# Patient Record
Sex: Male | Born: 1940 | Race: Black or African American | Hispanic: No | Marital: Married | Smoking: Former smoker
Health system: Southern US, Community
[De-identification: ages and names within clinical notes are randomized; demographics above are authoritative.]

## PROBLEM LIST (undated history)

## (undated) DIAGNOSIS — E785 Hyperlipidemia, unspecified: Secondary | ICD-10-CM

## (undated) DIAGNOSIS — D649 Anemia, unspecified: Secondary | ICD-10-CM

## (undated) DIAGNOSIS — M1990 Unspecified osteoarthritis, unspecified site: Secondary | ICD-10-CM

## (undated) DIAGNOSIS — C649 Malignant neoplasm of unspecified kidney, except renal pelvis: Secondary | ICD-10-CM

## (undated) DIAGNOSIS — E119 Type 2 diabetes mellitus without complications: Secondary | ICD-10-CM

## (undated) DIAGNOSIS — I10 Essential (primary) hypertension: Secondary | ICD-10-CM

## (undated) DIAGNOSIS — I639 Cerebral infarction, unspecified: Secondary | ICD-10-CM

## (undated) DIAGNOSIS — C61 Malignant neoplasm of prostate: Secondary | ICD-10-CM

## (undated) DIAGNOSIS — N189 Chronic kidney disease, unspecified: Secondary | ICD-10-CM

---

## 2008-03-06 ENCOUNTER — Ambulatory Visit: Admission: RE | Admit: 2008-03-06 | Discharge: 2008-06-04 | Payer: Self-pay | Admitting: Radiation Oncology

## 2008-04-01 ENCOUNTER — Encounter: Admission: RE | Admit: 2008-04-01 | Discharge: 2008-04-01 | Payer: Self-pay | Admitting: Urology

## 2008-04-22 ENCOUNTER — Ambulatory Visit (HOSPITAL_BASED_OUTPATIENT_CLINIC_OR_DEPARTMENT_OTHER): Admission: RE | Admit: 2008-04-22 | Discharge: 2008-04-22 | Payer: Self-pay | Admitting: Urology

## 2008-12-18 DIAGNOSIS — E785 Hyperlipidemia, unspecified: Secondary | ICD-10-CM | POA: Insufficient documentation

## 2008-12-18 DIAGNOSIS — Z8546 Personal history of malignant neoplasm of prostate: Secondary | ICD-10-CM | POA: Insufficient documentation

## 2008-12-18 DIAGNOSIS — I10 Essential (primary) hypertension: Secondary | ICD-10-CM | POA: Insufficient documentation

## 2008-12-19 DIAGNOSIS — K219 Gastro-esophageal reflux disease without esophagitis: Secondary | ICD-10-CM | POA: Insufficient documentation

## 2008-12-19 DIAGNOSIS — I69959 Hemiplegia and hemiparesis following unspecified cerebrovascular disease affecting unspecified side: Secondary | ICD-10-CM | POA: Insufficient documentation

## 2008-12-19 DIAGNOSIS — Z8673 Personal history of transient ischemic attack (TIA), and cerebral infarction without residual deficits: Secondary | ICD-10-CM | POA: Insufficient documentation

## 2009-12-01 DIAGNOSIS — N1832 Chronic kidney disease, stage 3b: Secondary | ICD-10-CM | POA: Insufficient documentation

## 2010-04-15 LAB — CBC
HCT: 38.2 % — ABNORMAL LOW (ref 39.0–52.0)
Hemoglobin: 12.7 g/dL — ABNORMAL LOW (ref 13.0–17.0)
MCV: 101.5 fL — ABNORMAL HIGH (ref 78.0–100.0)
WBC: 4 10*3/uL (ref 4.0–10.5)

## 2010-04-15 LAB — COMPREHENSIVE METABOLIC PANEL
Alkaline Phosphatase: 76 U/L (ref 39–117)
BUN: 17 mg/dL (ref 6–23)
Chloride: 104 mEq/L (ref 96–112)
GFR calc non Af Amer: 47 mL/min — ABNORMAL LOW (ref >60)
Glucose, Bld: 242 mg/dL — ABNORMAL HIGH (ref 70–99)
Potassium: 3.9 mEq/L (ref 3.5–5.1)
Total Bilirubin: 1 mg/dL (ref 0.3–1.2)

## 2010-04-15 LAB — PROTIME-INR
INR: 1 (ref 0.00–1.49)
Prothrombin Time: 13.5 seconds (ref 11.6–15.2)

## 2010-04-15 LAB — GLUCOSE, CAPILLARY: Glucose-Capillary: 170 mg/dL — ABNORMAL HIGH (ref 70–99)

## 2010-05-19 NOTE — Op Note (Signed)
NAMEMARQUIESE, Jason Nielsen              ACCOUNT NO.:  [id number]   MEDICAL RECORD NO.:  [id number]          PATIENT TYPE:  AMB   LOCATION:  [facility]                         FACILITY:  [facility]   PHYSICIAN:  Raynelle Bring, MD      DATE OF BIRTH:  09/09/1940   DATE OF PROCEDURE:  04/22/2008  DATE OF DISCHARGE:                               OPERATIVE REPORT   PREOPERATIVE DIAGNOSIS:  Prostate cancer.   POSTOPERATIVE DIAGNOSIS:  Prostate cancer.   PROCEDURES:  1. Flexible cystoscopy.  2. Transperineal placement of radiation seeds into prostate.   SURGEON:  Dr. Raynelle Bring.   RADIATION ONCOLOGIST:  Dr. Arloa Koh.   ANESTHESIA:  General.   COMPLICATIONS:  None.   INDICATIONS:  [name] is a 70 year old gentleman with clinically  localized adenocarcinoma of the prostate.  After a thorough discussion  regarding management options for treatment, he elected to proceed with  the above procedures.  The potential risks, complications, and  alternative treatment options were discussed in detail and informed  consent was obtained.   DESCRIPTION OF PROCEDURE:  The patient was taken to the operating room  and a general anesthetic was administered.  He was given preoperative  antibiotics, placed in the dorsal lithotomy position, and prepped and  draped in the usual sterile fashion.  A transperineal template was  placed over his perineum.  Using transrectal ultrasound guidance, a  preoperative plan for placement of the radiation seeds was performed by  Dr. Arloa Koh.  Once the preoperative plan was complete, needles  were placed through the transperineal template into the corresponding  area of the prostate according to the preoperative plan.  The radiation  seeds were then placed into the prostate via the [place] system.  A  total of 76 seeds were placed via 20 needles.  The total treatment dose  was 145 gray.  The patient tolerated the procedure well without  complications.  After  placement of the seeds, his genitalia was prepped  and draped in the usual sterile fashion.   The Foley catheter which had been placed prior to the procedure was now  removed and flexible cystourethroscopy was performed which demonstrated  a normal anterior and posterior urethra.  Examination of the bladder  systematically revealed the ureteral orifices to be in the normal  anatomic position and without evidence of any bladder tumors, stones, or  other mucosal pathology within the bladder.  No radiation seeds were  identified within the  bladder or prostatic urethra.  A Foley catheter was then reinserted and  left indwelling.  The patient tolerated the procedure well without  complications.  He was able to be awakened and transferred to the  recovery unit in satisfactory condition.      Raynelle Bring, MD  Electronically Signed     LB/MEDQ  D:  04/22/2008  T:  04/22/2008  Job:  [id number]

## 2011-01-06 DIAGNOSIS — I10 Essential (primary) hypertension: Secondary | ICD-10-CM | POA: Diagnosis not present

## 2011-01-06 DIAGNOSIS — E1149 Type 2 diabetes mellitus with other diabetic neurological complication: Secondary | ICD-10-CM | POA: Diagnosis not present

## 2011-01-06 DIAGNOSIS — E785 Hyperlipidemia, unspecified: Secondary | ICD-10-CM | POA: Diagnosis not present

## 2011-01-06 DIAGNOSIS — Z125 Encounter for screening for malignant neoplasm of prostate: Secondary | ICD-10-CM | POA: Diagnosis not present

## 2011-01-12 DIAGNOSIS — E1129 Type 2 diabetes mellitus with other diabetic kidney complication: Secondary | ICD-10-CM | POA: Diagnosis not present

## 2011-01-12 DIAGNOSIS — E1149 Type 2 diabetes mellitus with other diabetic neurological complication: Secondary | ICD-10-CM | POA: Diagnosis not present

## 2011-01-12 DIAGNOSIS — Z0000 Encounter for general adult medical examination without abnormal findings: Secondary | ICD-10-CM | POA: Diagnosis not present

## 2011-01-12 DIAGNOSIS — Z125 Encounter for screening for malignant neoplasm of prostate: Secondary | ICD-10-CM | POA: Diagnosis not present

## 2011-01-12 DIAGNOSIS — I10 Essential (primary) hypertension: Secondary | ICD-10-CM | POA: Diagnosis not present

## 2011-01-14 DIAGNOSIS — Z1212 Encounter for screening for malignant neoplasm of rectum: Secondary | ICD-10-CM | POA: Diagnosis not present

## 2011-03-12 DIAGNOSIS — C61 Malignant neoplasm of prostate: Secondary | ICD-10-CM | POA: Diagnosis not present

## 2011-03-18 DIAGNOSIS — H40019 Open angle with borderline findings, low risk, unspecified eye: Secondary | ICD-10-CM | POA: Diagnosis not present

## 2011-03-18 DIAGNOSIS — H1045 Other chronic allergic conjunctivitis: Secondary | ICD-10-CM | POA: Diagnosis not present

## 2011-03-18 DIAGNOSIS — H2510 Age-related nuclear cataract, unspecified eye: Secondary | ICD-10-CM | POA: Diagnosis not present

## 2011-03-18 DIAGNOSIS — H35039 Hypertensive retinopathy, unspecified eye: Secondary | ICD-10-CM | POA: Diagnosis not present

## 2011-03-18 DIAGNOSIS — E119 Type 2 diabetes mellitus without complications: Secondary | ICD-10-CM | POA: Diagnosis not present

## 2011-03-18 DIAGNOSIS — H04129 Dry eye syndrome of unspecified lacrimal gland: Secondary | ICD-10-CM | POA: Diagnosis not present

## 2011-03-19 DIAGNOSIS — N400 Enlarged prostate without lower urinary tract symptoms: Secondary | ICD-10-CM | POA: Diagnosis not present

## 2011-03-19 DIAGNOSIS — C61 Malignant neoplasm of prostate: Secondary | ICD-10-CM | POA: Diagnosis not present

## 2011-05-14 DIAGNOSIS — E1129 Type 2 diabetes mellitus with other diabetic kidney complication: Secondary | ICD-10-CM | POA: Diagnosis not present

## 2011-05-14 DIAGNOSIS — E785 Hyperlipidemia, unspecified: Secondary | ICD-10-CM | POA: Diagnosis not present

## 2011-05-14 DIAGNOSIS — I10 Essential (primary) hypertension: Secondary | ICD-10-CM | POA: Diagnosis not present

## 2011-05-14 DIAGNOSIS — E1149 Type 2 diabetes mellitus with other diabetic neurological complication: Secondary | ICD-10-CM | POA: Diagnosis not present

## 2011-05-14 DIAGNOSIS — I69959 Hemiplegia and hemiparesis following unspecified cerebrovascular disease affecting unspecified side: Secondary | ICD-10-CM | POA: Diagnosis not present

## 2011-06-11 DIAGNOSIS — H1045 Other chronic allergic conjunctivitis: Secondary | ICD-10-CM | POA: Diagnosis not present

## 2011-06-11 DIAGNOSIS — H04129 Dry eye syndrome of unspecified lacrimal gland: Secondary | ICD-10-CM | POA: Diagnosis not present

## 2011-06-11 DIAGNOSIS — H40019 Open angle with borderline findings, low risk, unspecified eye: Secondary | ICD-10-CM | POA: Diagnosis not present

## 2011-09-17 DIAGNOSIS — C61 Malignant neoplasm of prostate: Secondary | ICD-10-CM | POA: Diagnosis not present

## 2011-09-17 DIAGNOSIS — Z0000 Encounter for general adult medical examination without abnormal findings: Secondary | ICD-10-CM | POA: Diagnosis not present

## 2011-09-24 DIAGNOSIS — C61 Malignant neoplasm of prostate: Secondary | ICD-10-CM | POA: Diagnosis not present

## 2011-09-24 DIAGNOSIS — N400 Enlarged prostate without lower urinary tract symptoms: Secondary | ICD-10-CM | POA: Diagnosis not present

## 2011-10-01 DIAGNOSIS — I10 Essential (primary) hypertension: Secondary | ICD-10-CM | POA: Diagnosis not present

## 2011-10-01 DIAGNOSIS — E785 Hyperlipidemia, unspecified: Secondary | ICD-10-CM | POA: Diagnosis not present

## 2011-10-01 DIAGNOSIS — Z23 Encounter for immunization: Secondary | ICD-10-CM | POA: Diagnosis not present

## 2011-10-01 DIAGNOSIS — E1129 Type 2 diabetes mellitus with other diabetic kidney complication: Secondary | ICD-10-CM | POA: Diagnosis not present

## 2011-10-01 DIAGNOSIS — E1149 Type 2 diabetes mellitus with other diabetic neurological complication: Secondary | ICD-10-CM | POA: Diagnosis not present

## 2011-10-01 DIAGNOSIS — I69959 Hemiplegia and hemiparesis following unspecified cerebrovascular disease affecting unspecified side: Secondary | ICD-10-CM | POA: Diagnosis not present

## 2012-01-10 DIAGNOSIS — Z125 Encounter for screening for malignant neoplasm of prostate: Secondary | ICD-10-CM | POA: Diagnosis not present

## 2012-01-10 DIAGNOSIS — E1129 Type 2 diabetes mellitus with other diabetic kidney complication: Secondary | ICD-10-CM | POA: Diagnosis not present

## 2012-01-10 DIAGNOSIS — E785 Hyperlipidemia, unspecified: Secondary | ICD-10-CM | POA: Diagnosis not present

## 2012-01-10 DIAGNOSIS — I10 Essential (primary) hypertension: Secondary | ICD-10-CM | POA: Diagnosis not present

## 2012-01-13 DIAGNOSIS — E1129 Type 2 diabetes mellitus with other diabetic kidney complication: Secondary | ICD-10-CM | POA: Diagnosis not present

## 2012-01-13 DIAGNOSIS — Z0000 Encounter for general adult medical examination without abnormal findings: Secondary | ICD-10-CM | POA: Diagnosis not present

## 2012-01-13 DIAGNOSIS — I10 Essential (primary) hypertension: Secondary | ICD-10-CM | POA: Diagnosis not present

## 2012-02-03 DIAGNOSIS — Z1212 Encounter for screening for malignant neoplasm of rectum: Secondary | ICD-10-CM | POA: Diagnosis not present

## 2012-03-16 DIAGNOSIS — H35039 Hypertensive retinopathy, unspecified eye: Secondary | ICD-10-CM | POA: Diagnosis not present

## 2012-03-16 DIAGNOSIS — E119 Type 2 diabetes mellitus without complications: Secondary | ICD-10-CM | POA: Diagnosis not present

## 2012-03-29 DIAGNOSIS — C61 Malignant neoplasm of prostate: Secondary | ICD-10-CM | POA: Diagnosis not present

## 2012-03-29 DIAGNOSIS — N400 Enlarged prostate without lower urinary tract symptoms: Secondary | ICD-10-CM | POA: Diagnosis not present

## 2012-05-12 DIAGNOSIS — C61 Malignant neoplasm of prostate: Secondary | ICD-10-CM | POA: Diagnosis not present

## 2012-05-12 DIAGNOSIS — Z6829 Body mass index (BMI) 29.0-29.9, adult: Secondary | ICD-10-CM | POA: Diagnosis not present

## 2012-05-12 DIAGNOSIS — E1149 Type 2 diabetes mellitus with other diabetic neurological complication: Secondary | ICD-10-CM | POA: Diagnosis not present

## 2012-05-12 DIAGNOSIS — E785 Hyperlipidemia, unspecified: Secondary | ICD-10-CM | POA: Diagnosis not present

## 2012-05-12 DIAGNOSIS — I10 Essential (primary) hypertension: Secondary | ICD-10-CM | POA: Diagnosis not present

## 2012-05-12 DIAGNOSIS — H359 Unspecified retinal disorder: Secondary | ICD-10-CM | POA: Insufficient documentation

## 2012-05-12 DIAGNOSIS — H3500 Unspecified background retinopathy: Secondary | ICD-10-CM | POA: Diagnosis not present

## 2012-05-12 DIAGNOSIS — E1129 Type 2 diabetes mellitus with other diabetic kidney complication: Secondary | ICD-10-CM | POA: Diagnosis not present

## 2012-05-23 DIAGNOSIS — Z1331 Encounter for screening for depression: Secondary | ICD-10-CM | POA: Diagnosis not present

## 2012-05-23 DIAGNOSIS — Z6829 Body mass index (BMI) 29.0-29.9, adult: Secondary | ICD-10-CM | POA: Diagnosis not present

## 2012-05-23 DIAGNOSIS — M5430 Sciatica, unspecified side: Secondary | ICD-10-CM | POA: Diagnosis not present

## 2012-05-23 DIAGNOSIS — E1129 Type 2 diabetes mellitus with other diabetic kidney complication: Secondary | ICD-10-CM | POA: Diagnosis not present

## 2012-06-22 DIAGNOSIS — H40019 Open angle with borderline findings, low risk, unspecified eye: Secondary | ICD-10-CM | POA: Diagnosis not present

## 2012-09-14 DIAGNOSIS — Z23 Encounter for immunization: Secondary | ICD-10-CM | POA: Diagnosis not present

## 2012-09-14 DIAGNOSIS — I10 Essential (primary) hypertension: Secondary | ICD-10-CM | POA: Diagnosis not present

## 2012-09-14 DIAGNOSIS — H3500 Unspecified background retinopathy: Secondary | ICD-10-CM | POA: Diagnosis not present

## 2012-09-14 DIAGNOSIS — I69959 Hemiplegia and hemiparesis following unspecified cerebrovascular disease affecting unspecified side: Secondary | ICD-10-CM | POA: Diagnosis not present

## 2012-09-14 DIAGNOSIS — C61 Malignant neoplasm of prostate: Secondary | ICD-10-CM | POA: Diagnosis not present

## 2012-09-14 DIAGNOSIS — E1149 Type 2 diabetes mellitus with other diabetic neurological complication: Secondary | ICD-10-CM | POA: Diagnosis not present

## 2012-09-14 DIAGNOSIS — E1129 Type 2 diabetes mellitus with other diabetic kidney complication: Secondary | ICD-10-CM | POA: Diagnosis not present

## 2012-09-14 DIAGNOSIS — E785 Hyperlipidemia, unspecified: Secondary | ICD-10-CM | POA: Diagnosis not present

## 2012-10-06 DIAGNOSIS — C61 Malignant neoplasm of prostate: Secondary | ICD-10-CM | POA: Diagnosis not present

## 2012-10-10 DIAGNOSIS — N400 Enlarged prostate without lower urinary tract symptoms: Secondary | ICD-10-CM | POA: Diagnosis not present

## 2012-10-10 DIAGNOSIS — C61 Malignant neoplasm of prostate: Secondary | ICD-10-CM | POA: Diagnosis not present

## 2013-01-11 DIAGNOSIS — E785 Hyperlipidemia, unspecified: Secondary | ICD-10-CM | POA: Diagnosis not present

## 2013-01-11 DIAGNOSIS — I10 Essential (primary) hypertension: Secondary | ICD-10-CM | POA: Diagnosis not present

## 2013-01-11 DIAGNOSIS — E1149 Type 2 diabetes mellitus with other diabetic neurological complication: Secondary | ICD-10-CM | POA: Diagnosis not present

## 2013-01-11 DIAGNOSIS — Z125 Encounter for screening for malignant neoplasm of prostate: Secondary | ICD-10-CM | POA: Diagnosis not present

## 2013-01-18 DIAGNOSIS — E785 Hyperlipidemia, unspecified: Secondary | ICD-10-CM | POA: Diagnosis not present

## 2013-01-18 DIAGNOSIS — H3500 Unspecified background retinopathy: Secondary | ICD-10-CM | POA: Diagnosis not present

## 2013-01-18 DIAGNOSIS — Z0000 Encounter for general adult medical examination without abnormal findings: Secondary | ICD-10-CM | POA: Diagnosis not present

## 2013-01-18 DIAGNOSIS — N183 Chronic kidney disease, stage 3 unspecified: Secondary | ICD-10-CM | POA: Diagnosis not present

## 2013-01-18 DIAGNOSIS — I10 Essential (primary) hypertension: Secondary | ICD-10-CM | POA: Diagnosis not present

## 2013-01-18 DIAGNOSIS — E1129 Type 2 diabetes mellitus with other diabetic kidney complication: Secondary | ICD-10-CM | POA: Diagnosis not present

## 2013-01-18 DIAGNOSIS — K219 Gastro-esophageal reflux disease without esophagitis: Secondary | ICD-10-CM | POA: Diagnosis not present

## 2013-01-18 DIAGNOSIS — I69959 Hemiplegia and hemiparesis following unspecified cerebrovascular disease affecting unspecified side: Secondary | ICD-10-CM | POA: Diagnosis not present

## 2013-01-18 DIAGNOSIS — Z23 Encounter for immunization: Secondary | ICD-10-CM | POA: Diagnosis not present

## 2013-01-23 DIAGNOSIS — Z1212 Encounter for screening for malignant neoplasm of rectum: Secondary | ICD-10-CM | POA: Diagnosis not present

## 2013-03-22 DIAGNOSIS — H40019 Open angle with borderline findings, low risk, unspecified eye: Secondary | ICD-10-CM | POA: Diagnosis not present

## 2013-03-22 DIAGNOSIS — E119 Type 2 diabetes mellitus without complications: Secondary | ICD-10-CM | POA: Diagnosis not present

## 2013-03-22 DIAGNOSIS — H524 Presbyopia: Secondary | ICD-10-CM | POA: Diagnosis not present

## 2013-03-22 DIAGNOSIS — H35039 Hypertensive retinopathy, unspecified eye: Secondary | ICD-10-CM | POA: Diagnosis not present

## 2013-04-20 DIAGNOSIS — C61 Malignant neoplasm of prostate: Secondary | ICD-10-CM | POA: Diagnosis not present

## 2013-04-27 DIAGNOSIS — C61 Malignant neoplasm of prostate: Secondary | ICD-10-CM | POA: Diagnosis not present

## 2013-04-27 DIAGNOSIS — N400 Enlarged prostate without lower urinary tract symptoms: Secondary | ICD-10-CM | POA: Diagnosis not present

## 2013-05-18 DIAGNOSIS — E785 Hyperlipidemia, unspecified: Secondary | ICD-10-CM | POA: Diagnosis not present

## 2013-05-18 DIAGNOSIS — Z6829 Body mass index (BMI) 29.0-29.9, adult: Secondary | ICD-10-CM | POA: Diagnosis not present

## 2013-05-18 DIAGNOSIS — E1129 Type 2 diabetes mellitus with other diabetic kidney complication: Secondary | ICD-10-CM | POA: Diagnosis not present

## 2013-05-18 DIAGNOSIS — N183 Chronic kidney disease, stage 3 unspecified: Secondary | ICD-10-CM | POA: Diagnosis not present

## 2013-05-18 DIAGNOSIS — H3500 Unspecified background retinopathy: Secondary | ICD-10-CM | POA: Diagnosis not present

## 2013-05-18 DIAGNOSIS — I69959 Hemiplegia and hemiparesis following unspecified cerebrovascular disease affecting unspecified side: Secondary | ICD-10-CM | POA: Diagnosis not present

## 2013-05-18 DIAGNOSIS — Z8546 Personal history of malignant neoplasm of prostate: Secondary | ICD-10-CM | POA: Diagnosis not present

## 2013-05-18 DIAGNOSIS — I10 Essential (primary) hypertension: Secondary | ICD-10-CM | POA: Diagnosis not present

## 2013-06-21 DIAGNOSIS — H40019 Open angle with borderline findings, low risk, unspecified eye: Secondary | ICD-10-CM | POA: Diagnosis not present

## 2013-09-21 DIAGNOSIS — I10 Essential (primary) hypertension: Secondary | ICD-10-CM | POA: Diagnosis not present

## 2013-09-21 DIAGNOSIS — Z23 Encounter for immunization: Secondary | ICD-10-CM | POA: Diagnosis not present

## 2013-09-21 DIAGNOSIS — H3500 Unspecified background retinopathy: Secondary | ICD-10-CM | POA: Diagnosis not present

## 2013-09-21 DIAGNOSIS — E1129 Type 2 diabetes mellitus with other diabetic kidney complication: Secondary | ICD-10-CM | POA: Diagnosis not present

## 2013-09-21 DIAGNOSIS — I69959 Hemiplegia and hemiparesis following unspecified cerebrovascular disease affecting unspecified side: Secondary | ICD-10-CM | POA: Diagnosis not present

## 2013-09-21 DIAGNOSIS — E785 Hyperlipidemia, unspecified: Secondary | ICD-10-CM | POA: Diagnosis not present

## 2013-09-21 DIAGNOSIS — N183 Chronic kidney disease, stage 3 unspecified: Secondary | ICD-10-CM | POA: Diagnosis not present

## 2013-09-21 DIAGNOSIS — Z0000 Encounter for general adult medical examination without abnormal findings: Secondary | ICD-10-CM | POA: Diagnosis not present

## 2013-09-21 DIAGNOSIS — Z8546 Personal history of malignant neoplasm of prostate: Secondary | ICD-10-CM | POA: Diagnosis not present

## 2014-02-06 DIAGNOSIS — I10 Essential (primary) hypertension: Secondary | ICD-10-CM | POA: Diagnosis not present

## 2014-02-06 DIAGNOSIS — E785 Hyperlipidemia, unspecified: Secondary | ICD-10-CM | POA: Diagnosis not present

## 2014-02-06 DIAGNOSIS — E1129 Type 2 diabetes mellitus with other diabetic kidney complication: Secondary | ICD-10-CM | POA: Diagnosis not present

## 2014-02-06 DIAGNOSIS — Z125 Encounter for screening for malignant neoplasm of prostate: Secondary | ICD-10-CM | POA: Diagnosis not present

## 2014-02-13 DIAGNOSIS — I10 Essential (primary) hypertension: Secondary | ICD-10-CM | POA: Diagnosis not present

## 2014-02-13 DIAGNOSIS — Z6830 Body mass index (BMI) 30.0-30.9, adult: Secondary | ICD-10-CM | POA: Diagnosis not present

## 2014-02-13 DIAGNOSIS — N183 Chronic kidney disease, stage 3 (moderate): Secondary | ICD-10-CM | POA: Diagnosis not present

## 2014-02-13 DIAGNOSIS — Z8546 Personal history of malignant neoplasm of prostate: Secondary | ICD-10-CM | POA: Diagnosis not present

## 2014-02-13 DIAGNOSIS — E785 Hyperlipidemia, unspecified: Secondary | ICD-10-CM | POA: Diagnosis not present

## 2014-02-13 DIAGNOSIS — H3500 Unspecified background retinopathy: Secondary | ICD-10-CM | POA: Diagnosis not present

## 2014-02-13 DIAGNOSIS — Z1389 Encounter for screening for other disorder: Secondary | ICD-10-CM | POA: Diagnosis not present

## 2014-02-13 DIAGNOSIS — I69959 Hemiplegia and hemiparesis following unspecified cerebrovascular disease affecting unspecified side: Secondary | ICD-10-CM | POA: Diagnosis not present

## 2014-02-13 DIAGNOSIS — Z0000 Encounter for general adult medical examination without abnormal findings: Secondary | ICD-10-CM | POA: Diagnosis not present

## 2014-02-13 DIAGNOSIS — E1129 Type 2 diabetes mellitus with other diabetic kidney complication: Secondary | ICD-10-CM | POA: Diagnosis not present

## 2014-02-13 DIAGNOSIS — K219 Gastro-esophageal reflux disease without esophagitis: Secondary | ICD-10-CM | POA: Diagnosis not present

## 2014-02-19 DIAGNOSIS — Z1212 Encounter for screening for malignant neoplasm of rectum: Secondary | ICD-10-CM | POA: Diagnosis not present

## 2014-03-25 DIAGNOSIS — H40013 Open angle with borderline findings, low risk, bilateral: Secondary | ICD-10-CM | POA: Diagnosis not present

## 2014-03-25 DIAGNOSIS — E1165 Type 2 diabetes mellitus with hyperglycemia: Secondary | ICD-10-CM | POA: Diagnosis not present

## 2014-04-26 DIAGNOSIS — C61 Malignant neoplasm of prostate: Secondary | ICD-10-CM | POA: Diagnosis not present

## 2014-05-03 DIAGNOSIS — N401 Enlarged prostate with lower urinary tract symptoms: Secondary | ICD-10-CM | POA: Diagnosis not present

## 2014-05-03 DIAGNOSIS — C61 Malignant neoplasm of prostate: Secondary | ICD-10-CM | POA: Diagnosis not present

## 2014-05-03 DIAGNOSIS — R3916 Straining to void: Secondary | ICD-10-CM | POA: Diagnosis not present

## 2014-05-30 DIAGNOSIS — N183 Chronic kidney disease, stage 3 (moderate): Secondary | ICD-10-CM | POA: Diagnosis not present

## 2014-05-30 DIAGNOSIS — E785 Hyperlipidemia, unspecified: Secondary | ICD-10-CM | POA: Diagnosis not present

## 2014-05-30 DIAGNOSIS — I10 Essential (primary) hypertension: Secondary | ICD-10-CM | POA: Diagnosis not present

## 2014-05-30 DIAGNOSIS — M109 Gout, unspecified: Secondary | ICD-10-CM | POA: Diagnosis not present

## 2014-05-30 DIAGNOSIS — E1129 Type 2 diabetes mellitus with other diabetic kidney complication: Secondary | ICD-10-CM | POA: Diagnosis not present

## 2014-05-30 DIAGNOSIS — Z8546 Personal history of malignant neoplasm of prostate: Secondary | ICD-10-CM | POA: Diagnosis not present

## 2014-05-30 DIAGNOSIS — H3500 Unspecified background retinopathy: Secondary | ICD-10-CM | POA: Diagnosis not present

## 2014-05-30 DIAGNOSIS — I69959 Hemiplegia and hemiparesis following unspecified cerebrovascular disease affecting unspecified side: Secondary | ICD-10-CM | POA: Diagnosis not present

## 2014-05-30 DIAGNOSIS — Z6827 Body mass index (BMI) 27.0-27.9, adult: Secondary | ICD-10-CM | POA: Diagnosis not present

## 2014-06-24 DIAGNOSIS — H40013 Open angle with borderline findings, low risk, bilateral: Secondary | ICD-10-CM | POA: Diagnosis not present

## 2014-09-04 DIAGNOSIS — Z6828 Body mass index (BMI) 28.0-28.9, adult: Secondary | ICD-10-CM | POA: Diagnosis not present

## 2014-09-04 DIAGNOSIS — H3500 Unspecified background retinopathy: Secondary | ICD-10-CM | POA: Diagnosis not present

## 2014-09-04 DIAGNOSIS — M109 Gout, unspecified: Secondary | ICD-10-CM | POA: Diagnosis not present

## 2014-09-04 DIAGNOSIS — E1129 Type 2 diabetes mellitus with other diabetic kidney complication: Secondary | ICD-10-CM | POA: Diagnosis not present

## 2014-09-04 DIAGNOSIS — N183 Chronic kidney disease, stage 3 (moderate): Secondary | ICD-10-CM | POA: Diagnosis not present

## 2014-09-04 DIAGNOSIS — Z23 Encounter for immunization: Secondary | ICD-10-CM | POA: Diagnosis not present

## 2014-09-04 DIAGNOSIS — I10 Essential (primary) hypertension: Secondary | ICD-10-CM | POA: Diagnosis not present

## 2014-09-04 DIAGNOSIS — Z8546 Personal history of malignant neoplasm of prostate: Secondary | ICD-10-CM | POA: Diagnosis not present

## 2014-09-04 DIAGNOSIS — E785 Hyperlipidemia, unspecified: Secondary | ICD-10-CM | POA: Diagnosis not present

## 2014-10-22 DIAGNOSIS — H40013 Open angle with borderline findings, low risk, bilateral: Secondary | ICD-10-CM | POA: Diagnosis not present

## 2014-12-10 DIAGNOSIS — E113292 Type 2 diabetes mellitus with mild nonproliferative diabetic retinopathy without macular edema, left eye: Secondary | ICD-10-CM | POA: Diagnosis not present

## 2014-12-10 DIAGNOSIS — H2513 Age-related nuclear cataract, bilateral: Secondary | ICD-10-CM | POA: Diagnosis not present

## 2014-12-10 DIAGNOSIS — H25013 Cortical age-related cataract, bilateral: Secondary | ICD-10-CM | POA: Diagnosis not present

## 2014-12-10 DIAGNOSIS — E1139 Type 2 diabetes mellitus with other diabetic ophthalmic complication: Secondary | ICD-10-CM | POA: Insufficient documentation

## 2014-12-10 DIAGNOSIS — H40013 Open angle with borderline findings, low risk, bilateral: Secondary | ICD-10-CM | POA: Diagnosis not present

## 2015-02-11 DIAGNOSIS — E1129 Type 2 diabetes mellitus with other diabetic kidney complication: Secondary | ICD-10-CM | POA: Diagnosis not present

## 2015-02-11 DIAGNOSIS — N183 Chronic kidney disease, stage 3 (moderate): Secondary | ICD-10-CM | POA: Diagnosis not present

## 2015-02-11 DIAGNOSIS — Z125 Encounter for screening for malignant neoplasm of prostate: Secondary | ICD-10-CM | POA: Diagnosis not present

## 2015-02-11 DIAGNOSIS — E784 Other hyperlipidemia: Secondary | ICD-10-CM | POA: Diagnosis not present

## 2015-02-11 DIAGNOSIS — M109 Gout, unspecified: Secondary | ICD-10-CM | POA: Diagnosis not present

## 2015-02-18 DIAGNOSIS — Z0000 Encounter for general adult medical examination without abnormal findings: Secondary | ICD-10-CM | POA: Diagnosis not present

## 2015-02-18 DIAGNOSIS — I10 Essential (primary) hypertension: Secondary | ICD-10-CM | POA: Diagnosis not present

## 2015-02-18 DIAGNOSIS — N183 Chronic kidney disease, stage 3 (moderate): Secondary | ICD-10-CM | POA: Diagnosis not present

## 2015-02-18 DIAGNOSIS — M109 Gout, unspecified: Secondary | ICD-10-CM | POA: Diagnosis not present

## 2015-02-18 DIAGNOSIS — E784 Other hyperlipidemia: Secondary | ICD-10-CM | POA: Diagnosis not present

## 2015-02-18 DIAGNOSIS — Z1389 Encounter for screening for other disorder: Secondary | ICD-10-CM | POA: Diagnosis not present

## 2015-02-18 DIAGNOSIS — Z6829 Body mass index (BMI) 29.0-29.9, adult: Secondary | ICD-10-CM | POA: Diagnosis not present

## 2015-02-18 DIAGNOSIS — E1129 Type 2 diabetes mellitus with other diabetic kidney complication: Secondary | ICD-10-CM | POA: Diagnosis not present

## 2015-02-18 DIAGNOSIS — H3500 Unspecified background retinopathy: Secondary | ICD-10-CM | POA: Diagnosis not present

## 2015-02-18 DIAGNOSIS — I69959 Hemiplegia and hemiparesis following unspecified cerebrovascular disease affecting unspecified side: Secondary | ICD-10-CM | POA: Diagnosis not present

## 2015-02-18 DIAGNOSIS — M5431 Sciatica, right side: Secondary | ICD-10-CM | POA: Diagnosis not present

## 2015-02-18 DIAGNOSIS — E1139 Type 2 diabetes mellitus with other diabetic ophthalmic complication: Secondary | ICD-10-CM | POA: Diagnosis not present

## 2015-02-19 DIAGNOSIS — Z1212 Encounter for screening for malignant neoplasm of rectum: Secondary | ICD-10-CM | POA: Diagnosis not present

## 2015-04-04 DIAGNOSIS — C61 Malignant neoplasm of prostate: Secondary | ICD-10-CM | POA: Diagnosis not present

## 2015-04-09 DIAGNOSIS — Z0000 Encounter for general adult medical examination without abnormal findings: Secondary | ICD-10-CM | POA: Diagnosis not present

## 2015-04-09 DIAGNOSIS — N401 Enlarged prostate with lower urinary tract symptoms: Secondary | ICD-10-CM | POA: Diagnosis not present

## 2015-04-09 DIAGNOSIS — R3916 Straining to void: Secondary | ICD-10-CM | POA: Diagnosis not present

## 2015-04-09 DIAGNOSIS — C61 Malignant neoplasm of prostate: Secondary | ICD-10-CM | POA: Diagnosis not present

## 2015-04-09 DIAGNOSIS — N5201 Erectile dysfunction due to arterial insufficiency: Secondary | ICD-10-CM | POA: Diagnosis not present

## 2015-06-11 DIAGNOSIS — H40013 Open angle with borderline findings, low risk, bilateral: Secondary | ICD-10-CM | POA: Diagnosis not present

## 2015-06-11 DIAGNOSIS — H04123 Dry eye syndrome of bilateral lacrimal glands: Secondary | ICD-10-CM | POA: Diagnosis not present

## 2015-06-26 DIAGNOSIS — I69959 Hemiplegia and hemiparesis following unspecified cerebrovascular disease affecting unspecified side: Secondary | ICD-10-CM | POA: Diagnosis not present

## 2015-06-26 DIAGNOSIS — I10 Essential (primary) hypertension: Secondary | ICD-10-CM | POA: Diagnosis not present

## 2015-06-26 DIAGNOSIS — Z8546 Personal history of malignant neoplasm of prostate: Secondary | ICD-10-CM | POA: Diagnosis not present

## 2015-06-26 DIAGNOSIS — E1129 Type 2 diabetes mellitus with other diabetic kidney complication: Secondary | ICD-10-CM | POA: Diagnosis not present

## 2015-06-26 DIAGNOSIS — E1139 Type 2 diabetes mellitus with other diabetic ophthalmic complication: Secondary | ICD-10-CM | POA: Diagnosis not present

## 2015-06-26 DIAGNOSIS — E784 Other hyperlipidemia: Secondary | ICD-10-CM | POA: Diagnosis not present

## 2015-06-26 DIAGNOSIS — H3500 Unspecified background retinopathy: Secondary | ICD-10-CM | POA: Diagnosis not present

## 2015-06-26 DIAGNOSIS — N183 Chronic kidney disease, stage 3 (moderate): Secondary | ICD-10-CM | POA: Diagnosis not present

## 2015-06-26 DIAGNOSIS — Z6829 Body mass index (BMI) 29.0-29.9, adult: Secondary | ICD-10-CM | POA: Diagnosis not present

## 2015-09-20 DIAGNOSIS — Z23 Encounter for immunization: Secondary | ICD-10-CM | POA: Diagnosis not present

## 2015-11-05 DIAGNOSIS — Z6829 Body mass index (BMI) 29.0-29.9, adult: Secondary | ICD-10-CM | POA: Diagnosis not present

## 2015-11-05 DIAGNOSIS — I69954 Hemiplegia and hemiparesis following unspecified cerebrovascular disease affecting left non-dominant side: Secondary | ICD-10-CM | POA: Diagnosis not present

## 2015-11-05 DIAGNOSIS — Z8546 Personal history of malignant neoplasm of prostate: Secondary | ICD-10-CM | POA: Diagnosis not present

## 2015-11-05 DIAGNOSIS — N183 Chronic kidney disease, stage 3 (moderate): Secondary | ICD-10-CM | POA: Diagnosis not present

## 2015-11-05 DIAGNOSIS — I10 Essential (primary) hypertension: Secondary | ICD-10-CM | POA: Diagnosis not present

## 2015-11-05 DIAGNOSIS — M109 Gout, unspecified: Secondary | ICD-10-CM | POA: Diagnosis not present

## 2015-11-05 DIAGNOSIS — H3500 Unspecified background retinopathy: Secondary | ICD-10-CM | POA: Diagnosis not present

## 2015-11-05 DIAGNOSIS — E1139 Type 2 diabetes mellitus with other diabetic ophthalmic complication: Secondary | ICD-10-CM | POA: Diagnosis not present

## 2015-11-05 DIAGNOSIS — E1129 Type 2 diabetes mellitus with other diabetic kidney complication: Secondary | ICD-10-CM | POA: Diagnosis not present

## 2015-11-05 DIAGNOSIS — E784 Other hyperlipidemia: Secondary | ICD-10-CM | POA: Diagnosis not present

## 2015-12-11 DIAGNOSIS — H2513 Age-related nuclear cataract, bilateral: Secondary | ICD-10-CM | POA: Diagnosis not present

## 2015-12-11 DIAGNOSIS — H25013 Cortical age-related cataract, bilateral: Secondary | ICD-10-CM | POA: Diagnosis not present

## 2015-12-11 DIAGNOSIS — H04123 Dry eye syndrome of bilateral lacrimal glands: Secondary | ICD-10-CM | POA: Diagnosis not present

## 2015-12-11 DIAGNOSIS — H40013 Open angle with borderline findings, low risk, bilateral: Secondary | ICD-10-CM | POA: Diagnosis not present

## 2016-02-12 DIAGNOSIS — M109 Gout, unspecified: Secondary | ICD-10-CM | POA: Diagnosis not present

## 2016-02-12 DIAGNOSIS — E784 Other hyperlipidemia: Secondary | ICD-10-CM | POA: Diagnosis not present

## 2016-02-12 DIAGNOSIS — I10 Essential (primary) hypertension: Secondary | ICD-10-CM | POA: Diagnosis not present

## 2016-02-12 DIAGNOSIS — Z125 Encounter for screening for malignant neoplasm of prostate: Secondary | ICD-10-CM | POA: Diagnosis not present

## 2016-02-12 DIAGNOSIS — R8299 Other abnormal findings in urine: Secondary | ICD-10-CM | POA: Diagnosis not present

## 2016-02-19 DIAGNOSIS — Z1389 Encounter for screening for other disorder: Secondary | ICD-10-CM | POA: Diagnosis not present

## 2016-02-19 DIAGNOSIS — E1129 Type 2 diabetes mellitus with other diabetic kidney complication: Secondary | ICD-10-CM | POA: Diagnosis not present

## 2016-02-19 DIAGNOSIS — Z8546 Personal history of malignant neoplasm of prostate: Secondary | ICD-10-CM | POA: Diagnosis not present

## 2016-02-19 DIAGNOSIS — I69954 Hemiplegia and hemiparesis following unspecified cerebrovascular disease affecting left non-dominant side: Secondary | ICD-10-CM | POA: Diagnosis not present

## 2016-02-19 DIAGNOSIS — Z6829 Body mass index (BMI) 29.0-29.9, adult: Secondary | ICD-10-CM | POA: Diagnosis not present

## 2016-02-19 DIAGNOSIS — Z0000 Encounter for general adult medical examination without abnormal findings: Secondary | ICD-10-CM | POA: Diagnosis not present

## 2016-02-19 DIAGNOSIS — E1139 Type 2 diabetes mellitus with other diabetic ophthalmic complication: Secondary | ICD-10-CM | POA: Diagnosis not present

## 2016-02-19 DIAGNOSIS — E784 Other hyperlipidemia: Secondary | ICD-10-CM | POA: Diagnosis not present

## 2016-02-19 DIAGNOSIS — I10 Essential (primary) hypertension: Secondary | ICD-10-CM | POA: Diagnosis not present

## 2016-02-19 DIAGNOSIS — N183 Chronic kidney disease, stage 3 (moderate): Secondary | ICD-10-CM | POA: Diagnosis not present

## 2016-02-19 DIAGNOSIS — H3509 Other intraretinal microvascular abnormalities: Secondary | ICD-10-CM | POA: Diagnosis not present

## 2016-02-19 DIAGNOSIS — M109 Gout, unspecified: Secondary | ICD-10-CM | POA: Diagnosis not present

## 2016-05-05 DIAGNOSIS — R351 Nocturia: Secondary | ICD-10-CM | POA: Diagnosis not present

## 2016-05-05 DIAGNOSIS — C61 Malignant neoplasm of prostate: Secondary | ICD-10-CM | POA: Diagnosis not present

## 2016-05-05 DIAGNOSIS — N401 Enlarged prostate with lower urinary tract symptoms: Secondary | ICD-10-CM | POA: Diagnosis not present

## 2016-06-17 DIAGNOSIS — H40013 Open angle with borderline findings, low risk, bilateral: Secondary | ICD-10-CM | POA: Diagnosis not present

## 2016-06-17 DIAGNOSIS — H04123 Dry eye syndrome of bilateral lacrimal glands: Secondary | ICD-10-CM | POA: Diagnosis not present

## 2016-06-29 DIAGNOSIS — I10 Essential (primary) hypertension: Secondary | ICD-10-CM | POA: Diagnosis not present

## 2016-06-29 DIAGNOSIS — E784 Other hyperlipidemia: Secondary | ICD-10-CM | POA: Diagnosis not present

## 2016-06-29 DIAGNOSIS — Z6829 Body mass index (BMI) 29.0-29.9, adult: Secondary | ICD-10-CM | POA: Diagnosis not present

## 2016-06-29 DIAGNOSIS — E1129 Type 2 diabetes mellitus with other diabetic kidney complication: Secondary | ICD-10-CM | POA: Diagnosis not present

## 2016-06-29 DIAGNOSIS — I69954 Hemiplegia and hemiparesis following unspecified cerebrovascular disease affecting left non-dominant side: Secondary | ICD-10-CM | POA: Diagnosis not present

## 2016-06-29 DIAGNOSIS — Z1389 Encounter for screening for other disorder: Secondary | ICD-10-CM | POA: Diagnosis not present

## 2016-06-29 DIAGNOSIS — E1139 Type 2 diabetes mellitus with other diabetic ophthalmic complication: Secondary | ICD-10-CM | POA: Diagnosis not present

## 2016-06-29 DIAGNOSIS — N183 Chronic kidney disease, stage 3 (moderate): Secondary | ICD-10-CM | POA: Diagnosis not present

## 2016-10-04 DIAGNOSIS — E7849 Other hyperlipidemia: Secondary | ICD-10-CM | POA: Diagnosis not present

## 2016-10-04 DIAGNOSIS — I10 Essential (primary) hypertension: Secondary | ICD-10-CM | POA: Diagnosis not present

## 2016-10-04 DIAGNOSIS — Z23 Encounter for immunization: Secondary | ICD-10-CM | POA: Diagnosis not present

## 2016-10-04 DIAGNOSIS — N183 Chronic kidney disease, stage 3 (moderate): Secondary | ICD-10-CM | POA: Diagnosis not present

## 2016-10-04 DIAGNOSIS — I69954 Hemiplegia and hemiparesis following unspecified cerebrovascular disease affecting left non-dominant side: Secondary | ICD-10-CM | POA: Diagnosis not present

## 2016-10-04 DIAGNOSIS — M1000 Idiopathic gout, unspecified site: Secondary | ICD-10-CM | POA: Diagnosis not present

## 2016-10-04 DIAGNOSIS — Z6828 Body mass index (BMI) 28.0-28.9, adult: Secondary | ICD-10-CM | POA: Diagnosis not present

## 2016-10-04 DIAGNOSIS — E1139 Type 2 diabetes mellitus with other diabetic ophthalmic complication: Secondary | ICD-10-CM | POA: Diagnosis not present

## 2016-10-04 DIAGNOSIS — E1129 Type 2 diabetes mellitus with other diabetic kidney complication: Secondary | ICD-10-CM | POA: Diagnosis not present

## 2016-12-16 DIAGNOSIS — E119 Type 2 diabetes mellitus without complications: Secondary | ICD-10-CM | POA: Diagnosis not present

## 2016-12-16 DIAGNOSIS — H2513 Age-related nuclear cataract, bilateral: Secondary | ICD-10-CM | POA: Diagnosis not present

## 2016-12-16 DIAGNOSIS — H04123 Dry eye syndrome of bilateral lacrimal glands: Secondary | ICD-10-CM | POA: Diagnosis not present

## 2016-12-16 DIAGNOSIS — H40013 Open angle with borderline findings, low risk, bilateral: Secondary | ICD-10-CM | POA: Diagnosis not present

## 2017-02-22 DIAGNOSIS — E1139 Type 2 diabetes mellitus with other diabetic ophthalmic complication: Secondary | ICD-10-CM | POA: Diagnosis not present

## 2017-02-22 DIAGNOSIS — R82998 Other abnormal findings in urine: Secondary | ICD-10-CM | POA: Diagnosis not present

## 2017-02-22 DIAGNOSIS — E7849 Other hyperlipidemia: Secondary | ICD-10-CM | POA: Diagnosis not present

## 2017-02-22 DIAGNOSIS — Z125 Encounter for screening for malignant neoplasm of prostate: Secondary | ICD-10-CM | POA: Diagnosis not present

## 2017-02-22 DIAGNOSIS — M1000 Idiopathic gout, unspecified site: Secondary | ICD-10-CM | POA: Diagnosis not present

## 2017-02-22 DIAGNOSIS — I10 Essential (primary) hypertension: Secondary | ICD-10-CM | POA: Diagnosis not present

## 2017-03-01 DIAGNOSIS — Z6829 Body mass index (BMI) 29.0-29.9, adult: Secondary | ICD-10-CM | POA: Diagnosis not present

## 2017-03-01 DIAGNOSIS — K219 Gastro-esophageal reflux disease without esophagitis: Secondary | ICD-10-CM | POA: Diagnosis not present

## 2017-03-01 DIAGNOSIS — I10 Essential (primary) hypertension: Secondary | ICD-10-CM | POA: Diagnosis not present

## 2017-03-01 DIAGNOSIS — E1139 Type 2 diabetes mellitus with other diabetic ophthalmic complication: Secondary | ICD-10-CM | POA: Diagnosis not present

## 2017-03-01 DIAGNOSIS — E7849 Other hyperlipidemia: Secondary | ICD-10-CM | POA: Diagnosis not present

## 2017-03-01 DIAGNOSIS — H3500 Unspecified background retinopathy: Secondary | ICD-10-CM | POA: Diagnosis not present

## 2017-03-01 DIAGNOSIS — N183 Chronic kidney disease, stage 3 (moderate): Secondary | ICD-10-CM | POA: Diagnosis not present

## 2017-03-01 DIAGNOSIS — Z1389 Encounter for screening for other disorder: Secondary | ICD-10-CM | POA: Diagnosis not present

## 2017-03-01 DIAGNOSIS — E1129 Type 2 diabetes mellitus with other diabetic kidney complication: Secondary | ICD-10-CM | POA: Diagnosis not present

## 2017-03-01 DIAGNOSIS — Z0000 Encounter for general adult medical examination without abnormal findings: Secondary | ICD-10-CM | POA: Diagnosis not present

## 2017-03-01 DIAGNOSIS — I69954 Hemiplegia and hemiparesis following unspecified cerebrovascular disease affecting left non-dominant side: Secondary | ICD-10-CM | POA: Diagnosis not present

## 2017-03-01 DIAGNOSIS — M1000 Idiopathic gout, unspecified site: Secondary | ICD-10-CM | POA: Diagnosis not present

## 2017-05-08 ENCOUNTER — Emergency Department: Admission: EM | Admit: 2017-05-08 | Payer: Medicare Other | Source: Home / Self Care

## 2017-06-17 DIAGNOSIS — H40013 Open angle with borderline findings, low risk, bilateral: Secondary | ICD-10-CM | POA: Diagnosis not present

## 2017-06-23 DIAGNOSIS — I69954 Hemiplegia and hemiparesis following unspecified cerebrovascular disease affecting left non-dominant side: Secondary | ICD-10-CM | POA: Diagnosis not present

## 2017-06-23 DIAGNOSIS — H3500 Unspecified background retinopathy: Secondary | ICD-10-CM | POA: Diagnosis not present

## 2017-06-23 DIAGNOSIS — E7849 Other hyperlipidemia: Secondary | ICD-10-CM | POA: Diagnosis not present

## 2017-06-23 DIAGNOSIS — E1139 Type 2 diabetes mellitus with other diabetic ophthalmic complication: Secondary | ICD-10-CM | POA: Diagnosis not present

## 2017-06-23 DIAGNOSIS — I10 Essential (primary) hypertension: Secondary | ICD-10-CM | POA: Diagnosis not present

## 2017-06-23 DIAGNOSIS — N183 Chronic kidney disease, stage 3 (moderate): Secondary | ICD-10-CM | POA: Diagnosis not present

## 2017-06-23 DIAGNOSIS — M1000 Idiopathic gout, unspecified site: Secondary | ICD-10-CM | POA: Diagnosis not present

## 2017-06-23 DIAGNOSIS — Z6828 Body mass index (BMI) 28.0-28.9, adult: Secondary | ICD-10-CM | POA: Diagnosis not present

## 2017-06-23 DIAGNOSIS — E1129 Type 2 diabetes mellitus with other diabetic kidney complication: Secondary | ICD-10-CM | POA: Diagnosis not present

## 2017-11-01 DIAGNOSIS — M109 Gout, unspecified: Secondary | ICD-10-CM | POA: Diagnosis not present

## 2017-11-01 DIAGNOSIS — E1139 Type 2 diabetes mellitus with other diabetic ophthalmic complication: Secondary | ICD-10-CM | POA: Diagnosis not present

## 2017-11-01 DIAGNOSIS — N183 Chronic kidney disease, stage 3 (moderate): Secondary | ICD-10-CM | POA: Diagnosis not present

## 2017-11-01 DIAGNOSIS — E1129 Type 2 diabetes mellitus with other diabetic kidney complication: Secondary | ICD-10-CM | POA: Diagnosis not present

## 2017-11-01 DIAGNOSIS — Z23 Encounter for immunization: Secondary | ICD-10-CM | POA: Diagnosis not present

## 2017-11-01 DIAGNOSIS — I10 Essential (primary) hypertension: Secondary | ICD-10-CM | POA: Diagnosis not present

## 2017-11-01 DIAGNOSIS — H3500 Unspecified background retinopathy: Secondary | ICD-10-CM | POA: Diagnosis not present

## 2017-11-01 DIAGNOSIS — E7849 Other hyperlipidemia: Secondary | ICD-10-CM | POA: Diagnosis not present

## 2017-11-01 DIAGNOSIS — Z6828 Body mass index (BMI) 28.0-28.9, adult: Secondary | ICD-10-CM | POA: Diagnosis not present

## 2017-12-20 DIAGNOSIS — E119 Type 2 diabetes mellitus without complications: Secondary | ICD-10-CM | POA: Diagnosis not present

## 2017-12-20 DIAGNOSIS — H40013 Open angle with borderline findings, low risk, bilateral: Secondary | ICD-10-CM | POA: Diagnosis not present

## 2017-12-20 DIAGNOSIS — H2513 Age-related nuclear cataract, bilateral: Secondary | ICD-10-CM | POA: Diagnosis not present

## 2017-12-20 DIAGNOSIS — H25013 Cortical age-related cataract, bilateral: Secondary | ICD-10-CM | POA: Diagnosis not present

## 2018-02-24 DIAGNOSIS — E1139 Type 2 diabetes mellitus with other diabetic ophthalmic complication: Secondary | ICD-10-CM | POA: Diagnosis not present

## 2018-02-24 DIAGNOSIS — Z125 Encounter for screening for malignant neoplasm of prostate: Secondary | ICD-10-CM | POA: Diagnosis not present

## 2018-02-24 DIAGNOSIS — E7849 Other hyperlipidemia: Secondary | ICD-10-CM | POA: Diagnosis not present

## 2018-02-24 DIAGNOSIS — M109 Gout, unspecified: Secondary | ICD-10-CM | POA: Diagnosis not present

## 2018-02-24 DIAGNOSIS — R82998 Other abnormal findings in urine: Secondary | ICD-10-CM | POA: Diagnosis not present

## 2018-02-24 DIAGNOSIS — I10 Essential (primary) hypertension: Secondary | ICD-10-CM | POA: Diagnosis not present

## 2018-03-03 DIAGNOSIS — M109 Gout, unspecified: Secondary | ICD-10-CM | POA: Diagnosis not present

## 2018-03-03 DIAGNOSIS — E1129 Type 2 diabetes mellitus with other diabetic kidney complication: Secondary | ICD-10-CM | POA: Diagnosis not present

## 2018-03-03 DIAGNOSIS — H3500 Unspecified background retinopathy: Secondary | ICD-10-CM | POA: Diagnosis not present

## 2018-03-03 DIAGNOSIS — I69954 Hemiplegia and hemiparesis following unspecified cerebrovascular disease affecting left non-dominant side: Secondary | ICD-10-CM | POA: Diagnosis not present

## 2018-03-03 DIAGNOSIS — K219 Gastro-esophageal reflux disease without esophagitis: Secondary | ICD-10-CM | POA: Diagnosis not present

## 2018-03-03 DIAGNOSIS — Z8546 Personal history of malignant neoplasm of prostate: Secondary | ICD-10-CM | POA: Diagnosis not present

## 2018-03-03 DIAGNOSIS — Z0000 Encounter for general adult medical examination without abnormal findings: Secondary | ICD-10-CM | POA: Diagnosis not present

## 2018-03-03 DIAGNOSIS — E1139 Type 2 diabetes mellitus with other diabetic ophthalmic complication: Secondary | ICD-10-CM | POA: Diagnosis not present

## 2018-03-03 DIAGNOSIS — I10 Essential (primary) hypertension: Secondary | ICD-10-CM | POA: Diagnosis not present

## 2018-03-03 DIAGNOSIS — E7849 Other hyperlipidemia: Secondary | ICD-10-CM | POA: Diagnosis not present

## 2018-03-03 DIAGNOSIS — Z1331 Encounter for screening for depression: Secondary | ICD-10-CM | POA: Diagnosis not present

## 2018-03-03 DIAGNOSIS — N183 Chronic kidney disease, stage 3 (moderate): Secondary | ICD-10-CM | POA: Diagnosis not present

## 2018-06-22 DIAGNOSIS — H40013 Open angle with borderline findings, low risk, bilateral: Secondary | ICD-10-CM | POA: Diagnosis not present

## 2018-06-29 DIAGNOSIS — M109 Gout, unspecified: Secondary | ICD-10-CM | POA: Diagnosis not present

## 2018-06-29 DIAGNOSIS — E1139 Type 2 diabetes mellitus with other diabetic ophthalmic complication: Secondary | ICD-10-CM | POA: Diagnosis not present

## 2018-06-29 DIAGNOSIS — I129 Hypertensive chronic kidney disease with stage 1 through stage 4 chronic kidney disease, or unspecified chronic kidney disease: Secondary | ICD-10-CM | POA: Insufficient documentation

## 2018-06-29 DIAGNOSIS — Z8546 Personal history of malignant neoplasm of prostate: Secondary | ICD-10-CM | POA: Diagnosis not present

## 2018-06-29 DIAGNOSIS — H3500 Unspecified background retinopathy: Secondary | ICD-10-CM | POA: Diagnosis not present

## 2018-06-29 DIAGNOSIS — N183 Chronic kidney disease, stage 3 (moderate): Secondary | ICD-10-CM | POA: Diagnosis not present

## 2018-06-29 DIAGNOSIS — E1129 Type 2 diabetes mellitus with other diabetic kidney complication: Secondary | ICD-10-CM | POA: Diagnosis not present

## 2018-06-29 DIAGNOSIS — E785 Hyperlipidemia, unspecified: Secondary | ICD-10-CM | POA: Diagnosis not present

## 2018-10-26 DIAGNOSIS — E1139 Type 2 diabetes mellitus with other diabetic ophthalmic complication: Secondary | ICD-10-CM | POA: Diagnosis not present

## 2018-10-26 DIAGNOSIS — N1832 Chronic kidney disease, stage 3b: Secondary | ICD-10-CM | POA: Diagnosis not present

## 2018-10-26 DIAGNOSIS — I129 Hypertensive chronic kidney disease with stage 1 through stage 4 chronic kidney disease, or unspecified chronic kidney disease: Secondary | ICD-10-CM | POA: Diagnosis not present

## 2018-10-26 DIAGNOSIS — I69954 Hemiplegia and hemiparesis following unspecified cerebrovascular disease affecting left non-dominant side: Secondary | ICD-10-CM | POA: Diagnosis not present

## 2018-10-26 DIAGNOSIS — E785 Hyperlipidemia, unspecified: Secondary | ICD-10-CM | POA: Diagnosis not present

## 2018-10-26 DIAGNOSIS — Z23 Encounter for immunization: Secondary | ICD-10-CM | POA: Diagnosis not present

## 2018-10-26 DIAGNOSIS — E1129 Type 2 diabetes mellitus with other diabetic kidney complication: Secondary | ICD-10-CM | POA: Diagnosis not present

## 2019-02-06 DIAGNOSIS — E119 Type 2 diabetes mellitus without complications: Secondary | ICD-10-CM | POA: Diagnosis not present

## 2019-02-06 DIAGNOSIS — H04123 Dry eye syndrome of bilateral lacrimal glands: Secondary | ICD-10-CM | POA: Diagnosis not present

## 2019-02-06 DIAGNOSIS — H2513 Age-related nuclear cataract, bilateral: Secondary | ICD-10-CM | POA: Diagnosis not present

## 2019-02-06 DIAGNOSIS — H40013 Open angle with borderline findings, low risk, bilateral: Secondary | ICD-10-CM | POA: Diagnosis not present

## 2019-03-04 ENCOUNTER — Ambulatory Visit: Payer: Medicare Other | Attending: Internal Medicine

## 2019-03-04 DIAGNOSIS — Z23 Encounter for immunization: Secondary | ICD-10-CM | POA: Insufficient documentation

## 2019-03-04 NOTE — Progress Notes (Signed)
   Covid-19 Vaccination Clinic  Name:  Jason Nielsen    MRN: [id number] DOB: Jun 03, 1940  03/04/2019  [name] was observed post Covid-19 immunization for 15 minutes without incidence. He was provided with Vaccine Information Sheet and instruction to access the V-Safe system.   [name] was instructed to call 911 with any severe reactions post vaccine: Marland Kitchen Difficulty breathing  . Swelling of your face and throat  . A fast heartbeat  . A bad rash all over your body  . Dizziness and weakness    Immunizations Administered    Name Date Dose VIS Date Route   Pfizer COVID-19 Vaccine 03/04/2019 11:39 AM 0.3 mL 12/15/2018 Intramuscular   Manufacturer: [place]   Lot: [id number]   [place]: [address]-8

## 2019-03-04 NOTE — Progress Notes (Signed)
   Covid-19 Vaccination Clinic  Name:  Mason Dibiasio    MRN: [id number] DOB: 1940-12-21  03/04/2019  Mr. Huskins was observed post Covid-19 immunization for 15 minutes without incidence. He was provided with Vaccine Information Sheet and instruction to access the V-Safe system.   Mr. Kulish was instructed to call 911 with any severe reactions post vaccine: Marland Kitchen Difficulty breathing  . Swelling of your face and throat  . A fast heartbeat  . A bad rash all over your body  . Dizziness and weakness    Immunizations Administered    Name Date Dose VIS Date Route   Pfizer COVID-19 Vaccine 03/04/2019 11:39 AM 0.3 mL 12/15/2018 Intramuscular   Manufacturer: [place]   Lot: [id number]   [place]: [address]-5

## 2019-03-22 DIAGNOSIS — E7849 Other hyperlipidemia: Secondary | ICD-10-CM | POA: Diagnosis not present

## 2019-03-22 DIAGNOSIS — Z125 Encounter for screening for malignant neoplasm of prostate: Secondary | ICD-10-CM | POA: Diagnosis not present

## 2019-03-22 DIAGNOSIS — E1139 Type 2 diabetes mellitus with other diabetic ophthalmic complication: Secondary | ICD-10-CM | POA: Diagnosis not present

## 2019-03-22 DIAGNOSIS — M109 Gout, unspecified: Secondary | ICD-10-CM | POA: Diagnosis not present

## 2019-03-29 DIAGNOSIS — R82998 Other abnormal findings in urine: Secondary | ICD-10-CM | POA: Diagnosis not present

## 2019-03-29 DIAGNOSIS — Z79899 Other long term (current) drug therapy: Secondary | ICD-10-CM | POA: Diagnosis not present

## 2019-03-29 DIAGNOSIS — E785 Hyperlipidemia, unspecified: Secondary | ICD-10-CM | POA: Diagnosis not present

## 2019-03-29 DIAGNOSIS — M109 Gout, unspecified: Secondary | ICD-10-CM | POA: Diagnosis not present

## 2019-03-29 DIAGNOSIS — Z1339 Encounter for screening examination for other mental health and behavioral disorders: Secondary | ICD-10-CM | POA: Diagnosis not present

## 2019-03-29 DIAGNOSIS — N1832 Chronic kidney disease, stage 3b: Secondary | ICD-10-CM | POA: Diagnosis not present

## 2019-03-29 DIAGNOSIS — D649 Anemia, unspecified: Secondary | ICD-10-CM | POA: Diagnosis not present

## 2019-03-29 DIAGNOSIS — Z8546 Personal history of malignant neoplasm of prostate: Secondary | ICD-10-CM | POA: Diagnosis not present

## 2019-03-29 DIAGNOSIS — K219 Gastro-esophageal reflux disease without esophagitis: Secondary | ICD-10-CM | POA: Diagnosis not present

## 2019-03-29 DIAGNOSIS — Z0000 Encounter for general adult medical examination without abnormal findings: Secondary | ICD-10-CM | POA: Diagnosis not present

## 2019-03-29 DIAGNOSIS — E1129 Type 2 diabetes mellitus with other diabetic kidney complication: Secondary | ICD-10-CM | POA: Diagnosis not present

## 2019-03-29 DIAGNOSIS — I10 Essential (primary) hypertension: Secondary | ICD-10-CM | POA: Diagnosis not present

## 2019-03-29 DIAGNOSIS — I69954 Hemiplegia and hemiparesis following unspecified cerebrovascular disease affecting left non-dominant side: Secondary | ICD-10-CM | POA: Diagnosis not present

## 2019-03-29 DIAGNOSIS — Z1331 Encounter for screening for depression: Secondary | ICD-10-CM | POA: Diagnosis not present

## 2019-04-03 ENCOUNTER — Ambulatory Visit: Payer: Medicare Other | Attending: Internal Medicine

## 2019-04-03 DIAGNOSIS — Z23 Encounter for immunization: Secondary | ICD-10-CM

## 2019-04-03 NOTE — Progress Notes (Signed)
   Covid-19 Vaccination Clinic  Name:  Jason Nielsen    MRN: [id number] DOB: 27-Jul-1940  04/03/2019  [name] was observed post Covid-19 immunization for 15 minutes without incident. He was provided with Vaccine Information Sheet and instruction to access the V-Safe system.   [name] was instructed to call 911 with any severe reactions post vaccine: Marland Kitchen Difficulty breathing  . Swelling of face and throat  . A fast heartbeat  . A bad rash all over body  . Dizziness and weakness   Immunizations Administered    Name Date Dose VIS Date Route   Pfizer COVID-19 Vaccine 04/03/2019 10:49 AM 0.3 mL 12/15/2018 Intramuscular   Manufacturer: [place]   Lot: [id number]   [place]: [address]-7

## 2019-08-07 DIAGNOSIS — H524 Presbyopia: Secondary | ICD-10-CM | POA: Diagnosis not present

## 2019-08-07 DIAGNOSIS — H40013 Open angle with borderline findings, low risk, bilateral: Secondary | ICD-10-CM | POA: Diagnosis not present

## 2019-08-10 DIAGNOSIS — I129 Hypertensive chronic kidney disease with stage 1 through stage 4 chronic kidney disease, or unspecified chronic kidney disease: Secondary | ICD-10-CM | POA: Diagnosis not present

## 2019-08-10 DIAGNOSIS — E1129 Type 2 diabetes mellitus with other diabetic kidney complication: Secondary | ICD-10-CM | POA: Diagnosis not present

## 2019-08-10 DIAGNOSIS — E1139 Type 2 diabetes mellitus with other diabetic ophthalmic complication: Secondary | ICD-10-CM | POA: Diagnosis not present

## 2019-08-10 DIAGNOSIS — E1165 Type 2 diabetes mellitus with hyperglycemia: Secondary | ICD-10-CM | POA: Diagnosis not present

## 2019-08-10 DIAGNOSIS — E785 Hyperlipidemia, unspecified: Secondary | ICD-10-CM | POA: Diagnosis not present

## 2019-08-10 DIAGNOSIS — H3500 Unspecified background retinopathy: Secondary | ICD-10-CM | POA: Diagnosis not present

## 2019-08-10 DIAGNOSIS — D649 Anemia, unspecified: Secondary | ICD-10-CM | POA: Diagnosis not present

## 2019-08-10 DIAGNOSIS — N1832 Chronic kidney disease, stage 3b: Secondary | ICD-10-CM | POA: Diagnosis not present

## 2019-08-10 DIAGNOSIS — I69954 Hemiplegia and hemiparesis following unspecified cerebrovascular disease affecting left non-dominant side: Secondary | ICD-10-CM | POA: Diagnosis not present

## 2019-08-16 DIAGNOSIS — I129 Hypertensive chronic kidney disease with stage 1 through stage 4 chronic kidney disease, or unspecified chronic kidney disease: Secondary | ICD-10-CM | POA: Diagnosis not present

## 2019-08-16 DIAGNOSIS — E1129 Type 2 diabetes mellitus with other diabetic kidney complication: Secondary | ICD-10-CM | POA: Diagnosis not present

## 2019-08-16 DIAGNOSIS — E1139 Type 2 diabetes mellitus with other diabetic ophthalmic complication: Secondary | ICD-10-CM | POA: Diagnosis not present

## 2019-08-16 DIAGNOSIS — N1832 Chronic kidney disease, stage 3b: Secondary | ICD-10-CM | POA: Diagnosis not present

## 2019-08-16 DIAGNOSIS — Z794 Long term (current) use of insulin: Secondary | ICD-10-CM | POA: Diagnosis not present

## 2019-09-13 DIAGNOSIS — E1139 Type 2 diabetes mellitus with other diabetic ophthalmic complication: Secondary | ICD-10-CM | POA: Diagnosis not present

## 2019-09-13 DIAGNOSIS — N1832 Chronic kidney disease, stage 3b: Secondary | ICD-10-CM | POA: Diagnosis not present

## 2019-09-13 DIAGNOSIS — I129 Hypertensive chronic kidney disease with stage 1 through stage 4 chronic kidney disease, or unspecified chronic kidney disease: Secondary | ICD-10-CM | POA: Diagnosis not present

## 2019-09-13 DIAGNOSIS — Z794 Long term (current) use of insulin: Secondary | ICD-10-CM | POA: Diagnosis not present

## 2019-10-02 DIAGNOSIS — D631 Anemia in chronic kidney disease: Secondary | ICD-10-CM | POA: Diagnosis not present

## 2019-10-02 DIAGNOSIS — M1000 Idiopathic gout, unspecified site: Secondary | ICD-10-CM | POA: Diagnosis not present

## 2019-10-02 DIAGNOSIS — M899 Disorder of bone, unspecified: Secondary | ICD-10-CM | POA: Diagnosis not present

## 2019-10-02 DIAGNOSIS — R809 Proteinuria, unspecified: Secondary | ICD-10-CM | POA: Diagnosis not present

## 2019-10-02 DIAGNOSIS — I129 Hypertensive chronic kidney disease with stage 1 through stage 4 chronic kidney disease, or unspecified chronic kidney disease: Secondary | ICD-10-CM | POA: Diagnosis not present

## 2019-10-02 DIAGNOSIS — N184 Chronic kidney disease, stage 4 (severe): Secondary | ICD-10-CM | POA: Diagnosis not present

## 2019-10-10 ENCOUNTER — Other Ambulatory Visit: Payer: Self-pay | Admitting: Nephrology

## 2019-10-10 DIAGNOSIS — N184 Chronic kidney disease, stage 4 (severe): Secondary | ICD-10-CM

## 2019-10-10 DIAGNOSIS — R809 Proteinuria, unspecified: Secondary | ICD-10-CM

## 2019-10-15 ENCOUNTER — Ambulatory Visit
Admission: RE | Admit: 2019-10-15 | Discharge: 2019-10-15 | Disposition: A | Discharge status: Home / Self Care | Payer: Medicare Other | Source: Ambulatory Visit | Attending: Nephrology | Admitting: Nephrology

## 2019-10-15 DIAGNOSIS — N189 Chronic kidney disease, unspecified: Secondary | ICD-10-CM | POA: Diagnosis not present

## 2019-10-15 DIAGNOSIS — N184 Chronic kidney disease, stage 4 (severe): Secondary | ICD-10-CM

## 2019-10-15 DIAGNOSIS — N2889 Other specified disorders of kidney and ureter: Secondary | ICD-10-CM | POA: Diagnosis not present

## 2019-10-15 DIAGNOSIS — Z23 Encounter for immunization: Secondary | ICD-10-CM | POA: Diagnosis not present

## 2019-10-15 DIAGNOSIS — N281 Cyst of kidney, acquired: Secondary | ICD-10-CM | POA: Diagnosis not present

## 2019-10-15 DIAGNOSIS — R809 Proteinuria, unspecified: Secondary | ICD-10-CM

## 2019-10-25 DIAGNOSIS — Z794 Long term (current) use of insulin: Secondary | ICD-10-CM | POA: Diagnosis not present

## 2019-10-25 DIAGNOSIS — E1129 Type 2 diabetes mellitus with other diabetic kidney complication: Secondary | ICD-10-CM | POA: Diagnosis not present

## 2019-10-25 DIAGNOSIS — N1832 Chronic kidney disease, stage 3b: Secondary | ICD-10-CM | POA: Diagnosis not present

## 2019-10-25 DIAGNOSIS — I129 Hypertensive chronic kidney disease with stage 1 through stage 4 chronic kidney disease, or unspecified chronic kidney disease: Secondary | ICD-10-CM | POA: Diagnosis not present

## 2019-11-01 ENCOUNTER — Other Ambulatory Visit: Payer: Self-pay | Admitting: Nephrology

## 2019-11-01 DIAGNOSIS — D631 Anemia in chronic kidney disease: Secondary | ICD-10-CM | POA: Diagnosis not present

## 2019-11-01 DIAGNOSIS — M899 Disorder of bone, unspecified: Secondary | ICD-10-CM | POA: Diagnosis not present

## 2019-11-01 DIAGNOSIS — N184 Chronic kidney disease, stage 4 (severe): Secondary | ICD-10-CM | POA: Diagnosis not present

## 2019-11-01 DIAGNOSIS — M1000 Idiopathic gout, unspecified site: Secondary | ICD-10-CM | POA: Diagnosis not present

## 2019-11-01 DIAGNOSIS — I129 Hypertensive chronic kidney disease with stage 1 through stage 4 chronic kidney disease, or unspecified chronic kidney disease: Secondary | ICD-10-CM | POA: Diagnosis not present

## 2019-11-01 DIAGNOSIS — N2889 Other specified disorders of kidney and ureter: Secondary | ICD-10-CM

## 2019-11-22 ENCOUNTER — Other Ambulatory Visit: Payer: Self-pay

## 2019-11-22 ENCOUNTER — Ambulatory Visit
Admission: RE | Admit: 2019-11-22 | Discharge: 2019-11-22 | Disposition: A | Discharge status: Home / Self Care | Payer: Medicare Other | Source: Ambulatory Visit | Attending: Nephrology | Admitting: Nephrology

## 2019-11-22 DIAGNOSIS — N2889 Other specified disorders of kidney and ureter: Secondary | ICD-10-CM

## 2019-11-22 DIAGNOSIS — D3501 Benign neoplasm of right adrenal gland: Secondary | ICD-10-CM | POA: Diagnosis not present

## 2019-11-22 MED ORDER — GADOBENATE DIMEGLUMINE 529 MG/ML IV SOLN
14.0000 mL | Freq: Once | INTRAVENOUS | Status: AC | PRN
  Administered 2019-11-22: 14 mL via INTRAVENOUS

## 2019-12-10 DIAGNOSIS — Z23 Encounter for immunization: Secondary | ICD-10-CM | POA: Diagnosis not present

## 2019-12-14 DIAGNOSIS — H3500 Unspecified background retinopathy: Secondary | ICD-10-CM | POA: Diagnosis not present

## 2019-12-14 DIAGNOSIS — D649 Anemia, unspecified: Secondary | ICD-10-CM | POA: Diagnosis not present

## 2019-12-14 DIAGNOSIS — Z794 Long term (current) use of insulin: Secondary | ICD-10-CM | POA: Diagnosis not present

## 2019-12-14 DIAGNOSIS — E1129 Type 2 diabetes mellitus with other diabetic kidney complication: Secondary | ICD-10-CM | POA: Diagnosis not present

## 2019-12-14 DIAGNOSIS — N2889 Other specified disorders of kidney and ureter: Secondary | ICD-10-CM | POA: Diagnosis not present

## 2019-12-14 DIAGNOSIS — E785 Hyperlipidemia, unspecified: Secondary | ICD-10-CM | POA: Diagnosis not present

## 2019-12-14 DIAGNOSIS — E1139 Type 2 diabetes mellitus with other diabetic ophthalmic complication: Secondary | ICD-10-CM | POA: Diagnosis not present

## 2019-12-14 DIAGNOSIS — I129 Hypertensive chronic kidney disease with stage 1 through stage 4 chronic kidney disease, or unspecified chronic kidney disease: Secondary | ICD-10-CM | POA: Diagnosis not present

## 2019-12-14 DIAGNOSIS — N1832 Chronic kidney disease, stage 3b: Secondary | ICD-10-CM | POA: Diagnosis not present

## 2019-12-18 DIAGNOSIS — C61 Malignant neoplasm of prostate: Secondary | ICD-10-CM | POA: Diagnosis not present

## 2019-12-18 DIAGNOSIS — I82221 Chronic embolism and thrombosis of inferior vena cava: Secondary | ICD-10-CM | POA: Diagnosis not present

## 2019-12-18 DIAGNOSIS — R351 Nocturia: Secondary | ICD-10-CM | POA: Diagnosis not present

## 2019-12-18 DIAGNOSIS — N401 Enlarged prostate with lower urinary tract symptoms: Secondary | ICD-10-CM | POA: Diagnosis not present

## 2019-12-18 DIAGNOSIS — D49511 Neoplasm of unspecified behavior of right kidney: Secondary | ICD-10-CM | POA: Diagnosis not present

## 2019-12-31 ENCOUNTER — Other Ambulatory Visit (HOSPITAL_COMMUNITY): Payer: Self-pay | Admitting: Urology

## 2019-12-31 ENCOUNTER — Other Ambulatory Visit: Payer: Self-pay | Admitting: Urology

## 2019-12-31 DIAGNOSIS — D49511 Neoplasm of unspecified behavior of right kidney: Secondary | ICD-10-CM

## 2019-12-31 DIAGNOSIS — I82221 Chronic embolism and thrombosis of inferior vena cava: Secondary | ICD-10-CM

## 2019-12-31 DIAGNOSIS — I8290 Acute embolism and thrombosis of unspecified vein: Secondary | ICD-10-CM

## 2020-01-01 ENCOUNTER — Other Ambulatory Visit: Payer: Self-pay

## 2020-01-01 ENCOUNTER — Ambulatory Visit (HOSPITAL_COMMUNITY)
Admission: RE | Admit: 2020-01-01 | Discharge: 2020-01-01 | Disposition: A | Discharge status: Home / Self Care | Payer: Medicare Other | Source: Ambulatory Visit | Attending: Urology | Admitting: Urology

## 2020-01-01 ENCOUNTER — Encounter (HOSPITAL_COMMUNITY): Payer: Self-pay

## 2020-01-01 DIAGNOSIS — I82221 Chronic embolism and thrombosis of inferior vena cava: Secondary | ICD-10-CM | POA: Insufficient documentation

## 2020-01-01 NOTE — Progress Notes (Unsigned)
Jason Nielsen Male, 79 y.o., [birth year]  MRN:  [id number] Phone:  [phone] (H)       PCP:  Marton Redwood, MD Primary Cvg:  Medicare/Medicare Part A And B  Next Appt With Radiology ([facility]-US 2) 01/08/2020 at 8:00 AM           RE: Biopsy Received: Yesterday  Message Details  [name], MD  [name] OK for US guided biopsy of right renal mass.   GY    Previous Messages  ----- Message -----  From: [name]  Sent: 12/31/2019  2:40 PM EST  To: Ir Procedure Requests  Subject: Biopsy                      Procedure Requested: Percutaneous biopsy    Reason for Procedure: right renal mass    Provider Requesting: Dr. Raynelle Bring  Provider Telephone: [phone]

## 2020-01-01 NOTE — Progress Notes (Signed)
Bilateral lower extremity venous duplex completed. Refer to "CV Proc" under chart review to view preliminary results.  01/01/2020 2:36 PM Kelby Aline., MHA, RVT, RDCS, RDMS

## 2020-01-07 ENCOUNTER — Other Ambulatory Visit (HOSPITAL_COMMUNITY): Payer: Self-pay | Admitting: Physician Assistant

## 2020-01-07 ENCOUNTER — Other Ambulatory Visit: Payer: Self-pay | Admitting: Radiology

## 2020-01-07 ENCOUNTER — Other Ambulatory Visit: Payer: Self-pay | Admitting: Student

## 2020-01-08 ENCOUNTER — Ambulatory Visit (HOSPITAL_COMMUNITY)
Admission: RE | Admit: 2020-01-08 | Discharge: 2020-01-08 | Disposition: A | Discharge status: Home / Self Care | Payer: Medicare Other | Source: Ambulatory Visit | Attending: Urology | Admitting: Urology

## 2020-01-08 ENCOUNTER — Other Ambulatory Visit: Payer: Self-pay

## 2020-01-08 ENCOUNTER — Encounter (HOSPITAL_COMMUNITY): Payer: Self-pay

## 2020-01-08 DIAGNOSIS — C641 Malignant neoplasm of right kidney, except renal pelvis: Secondary | ICD-10-CM | POA: Diagnosis not present

## 2020-01-08 DIAGNOSIS — C649 Malignant neoplasm of unspecified kidney, except renal pelvis: Secondary | ICD-10-CM | POA: Diagnosis not present

## 2020-01-08 DIAGNOSIS — I82221 Chronic embolism and thrombosis of inferior vena cava: Secondary | ICD-10-CM | POA: Insufficient documentation

## 2020-01-08 DIAGNOSIS — N2889 Other specified disorders of kidney and ureter: Secondary | ICD-10-CM | POA: Diagnosis not present

## 2020-01-08 DIAGNOSIS — D49511 Neoplasm of unspecified behavior of right kidney: Secondary | ICD-10-CM | POA: Diagnosis present

## 2020-01-08 DIAGNOSIS — R809 Proteinuria, unspecified: Secondary | ICD-10-CM | POA: Diagnosis not present

## 2020-01-08 DIAGNOSIS — N189 Chronic kidney disease, unspecified: Secondary | ICD-10-CM | POA: Diagnosis not present

## 2020-01-08 DIAGNOSIS — E1122 Type 2 diabetes mellitus with diabetic chronic kidney disease: Secondary | ICD-10-CM | POA: Insufficient documentation

## 2020-01-08 HISTORY — DX: Chronic kidney disease, unspecified: N18.9

## 2020-01-08 HISTORY — DX: Type 2 diabetes mellitus without complications: E11.9

## 2020-01-08 LAB — CBC
HCT: 34.3 % — ABNORMAL LOW (ref 39.0–52.0)
Hemoglobin: 10.3 g/dL — ABNORMAL LOW (ref 13.0–17.0)
MCH: 29.8 pg (ref 26.0–34.0)
MCHC: 30 g/dL (ref 30.0–36.0)
MCV: 99.1 fL (ref 80.0–100.0)
Platelets: 241 10*3/uL (ref 150–400)
RBC: 3.46 MIL/uL — ABNORMAL LOW (ref 4.22–5.81)
RDW: 15.2 % (ref 11.5–15.5)
WBC: 5.5 10*3/uL (ref 4.0–10.5)
nRBC: 0 % (ref 0.0–0.2)

## 2020-01-08 LAB — PROTIME-INR
INR: 1.1 (ref 0.8–1.2)
Prothrombin Time: 14 seconds (ref 11.4–15.2)

## 2020-01-08 MED ORDER — SODIUM CHLORIDE 0.9 % IV SOLN: INTRAVENOUS | Status: DC

## 2020-01-08 MED ORDER — HYDROCODONE-ACETAMINOPHEN 5-325 MG PO TABS
1.0000 | ORAL_TABLET | ORAL | Status: DC | PRN
Start: 2020-01-08 — End: 2020-01-09

## 2020-01-08 MED ORDER — MIDAZOLAM HCL 2 MG/2ML IJ SOLN
INTRAMUSCULAR | Status: AC | PRN
Start: 2020-01-08 — End: 2020-01-08
  Administered 2020-01-08: 1 mg via INTRAVENOUS

## 2020-01-08 MED ORDER — FENTANYL CITRATE (PF) 100 MCG/2ML IJ SOLN
INTRAMUSCULAR | Status: AC
  Filled 2020-01-08: qty 2

## 2020-01-08 MED ORDER — FENTANYL CITRATE (PF) 100 MCG/2ML IJ SOLN
INTRAMUSCULAR | Status: AC | PRN
  Administered 2020-01-08: 50 ug via INTRAVENOUS

## 2020-01-08 MED ORDER — LIDOCAINE HCL (PF) 1 % IJ SOLN
INTRAMUSCULAR | Status: AC
  Filled 2020-01-08: qty 30

## 2020-01-08 MED ORDER — MIDAZOLAM HCL 2 MG/2ML IJ SOLN
INTRAMUSCULAR | Status: AC
  Filled 2020-01-08: qty 2

## 2020-01-08 MED ORDER — GELATIN ABSORBABLE 12-7 MM EX MISC
CUTANEOUS | Status: AC
  Filled 2020-01-08: qty 1

## 2020-01-08 NOTE — Discharge Instructions (Signed)
Percutaneous Kidney Biopsy, Care After This sheet gives you information about how to care for yourself after your procedure. Your health care provider may also give you more specific instructions. If you have problems or questions, contact your health care provider. What can I expect after the procedure? After the procedure, it is common to have:  Pain or soreness near the biopsy site.  Pink or cloudy urine for 24 hours after the procedure. Follow these instructions at home: Activity  Return to your normal activities as told by your health care provider. Ask your health care provider what activities are safe for you.  If you were given a sedative during the procedure, it can affect you for several hours. Do not drive or operate machinery until your health care provider says that it is safe.  Do not lift anything that is heavier than 10 lb (4.5 kg), or the limit that you are told, until your health care provider says that it is safe.  Avoid activities that take a lot of effort (are strenuous) until your health care provider approves. Most people will have to wait 2 weeks before returning to activities such as exercise or sex. General instructions   Take over-the-counter and prescription medicines only as told by your health care provider.  You may eat and drink after your procedure. Follow instructions from your health care provider about eating or drinking restrictions.  Check your biopsy site every day for signs of infection. Check for: ? More redness, swelling, or pain. ? Fluid or blood. ? Warmth. ? Pus or a bad smell.  Keep all follow-up visits as told by your health care provider. This is important. Contact a health care provider if:  You have more redness, swelling, or pain around your biopsy site.  You have fluid or blood coming from your biopsy site.  Your biopsy site feels warm to the touch.  You have pus or a bad smell coming from your biopsy site.  You have blood  in your urine more than 24 hours after your procedure.  You have a fever. Get help right away if:  Your urine is dark red or brown.  You cannot urinate.  It burns when you urinate.  You feel dizzy or light-headed.  You have severe pain in your abdomen or side. Summary  After the procedure, it is common to have pain or soreness at the biopsy site and pink or cloudy urine for the first 24 hours.  Check your biopsy site each day for signs of infection, such as more redness, swelling, or pain; fluid, blood, pus or a bad smell coming from the biopsy site; or the biopsy site feeling warm to touch.  Return to your normal activities as told by your health care provider. This information is not intended to replace advice given to you by your health care provider. Make sure you discuss any questions you have with your health care provider. Document Revised: 08/24/2018 Document Reviewed: 08/24/2018 Elsevier Patient Education  2020 Elsevier Inc.  

## 2020-01-08 NOTE — Procedures (Signed)
  Procedure: US guided R renal mass core biopsy   EBL:   minimal Complications:  none immediate  See full dictation in [employer].  Dillard Cannon MD Main # [phone] Pager  [phone] Mobile [phone]

## 2020-01-08 NOTE — Progress Notes (Signed)
Patient and daughter was given discharge instructions. Both verbalized understanding. 

## 2020-01-08 NOTE — H&P (Signed)
Chief Complaint: Patient was seen in consultation today for right renal mass biopsy at the request of [place]  Referring Physician(s): Borden,Lester  Supervising Physician: Arne Cleveland  Patient Status: [facility] - Out-pt  History of Present Illness: Jason Nielsen is a 80 y.o. male   Known chronic renal disease Proteinuria  Not on dialysis Referred to Dr Alinda Money by PMD  [place] in Oct 2021 revealing Rt renal mass  MRI: 11/22/19:  IMPRESSION: 5.9 cm solid heterogeneously enhancing mass centered in the lateral interpolar region of the right kidney, consistent with renal cell carcinoma. 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 Lack of contrast enhancement favors bland thrombus, however tumor thrombus cannot definitely be excluded. No other abdominal metastatic disease. 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  Bilat LE doppler 01/01/20: Neg for DVT Bilaterally  Request from Dr Alinda Money for right renal mass  Approved by IR Rad  LD Plavix 01/02/20  Past Medical History:  Diagnosis Date  . Chronic kidney disease   . Diabetes mellitus without complication ([place])     History reviewed. No pertinent surgical history.  Allergies: Patient has no known allergies.  Medications: Prior to Admission medications   Not on File     History reviewed. No pertinent family history.  Social History   Socioeconomic History  . Marital status: Married    Spouse name: Not on file  . Number of children: Not on file  . Years of education: Not on file  . Highest education level: Not on file  Occupational History  . Not on file  Tobacco Use  . Smoking status: Not on file  . Smokeless tobacco: Not on file  Substance and Sexual Activity  . Alcohol use: Not on file  . Drug use: Not on file  . Sexual activity: Not on file  Other Topics Concern  . Not on file  Social History Narrative  . Not on file   Social Determinants of Health   Financial Resource Strain: Not on file  Food  Insecurity: Not on file  Transportation Needs: Not on file  Physical Activity: Not on file  Stress: Not on file  Social Connections: Not on file    Review of Systems: A 12 point ROS discussed and pertinent positives are indicated in the HPI above.  All other systems are negative.  Review of Systems  Constitutional: Negative for activity change, fatigue and fever.  Respiratory: Negative for cough and shortness of breath.   Cardiovascular: Negative for chest pain.  Gastrointestinal: Negative for abdominal pain, nausea and vomiting.  Musculoskeletal: Negative for back pain.  Psychiatric/Behavioral: Negative for behavioral problems and confusion.    Vital Signs: BP (!) 146/74   Pulse 71   Temp 97.7 F (36.5 C) (Oral)   Resp 16   Ht 5\' 4"  (1.626 m)   Wt 162 lb (73.5 kg)   SpO2 100%   BMI 27.81 kg/m   Physical Exam Vitals reviewed.  Cardiovascular:     Rate and Rhythm: Normal rate and regular rhythm.     Heart sounds: Normal heart sounds.  Pulmonary:     Effort: Pulmonary effort is normal.     Breath sounds: Normal breath sounds.  Abdominal:     Palpations: Abdomen is soft.     Tenderness: There is no abdominal tenderness.  Musculoskeletal:        General: Normal range of motion.  Skin:    General: Skin is warm.  Neurological:     Mental Status: He is alert and oriented  to person, place, and time.  Psychiatric:        Behavior: Behavior normal.     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  Comparison Study: No prior study Performing Technologist: Maudry Mayhew MHA, RDMS, RVT, RDCS  Examination Guidelines: A complete evaluation includes B-mode imaging, spectral Doppler, color Doppler, and power Doppler as needed of all accessible portions of each vessel. Bilateral testing is considered an integral part of a complete examination. Limited examinations for reoccurring  indications may be performed as noted. The reflux portion of the exam is performed with the patient in reverse Trendelenburg.  +---------+---------------+---------+-----------+----------+--------------+ RIGHT    CompressibilityPhasicitySpontaneityPropertiesThrombus Aging +---------+---------------+---------+-----------+----------+--------------+ CFV      Full           Yes      Yes                                 +---------+---------------+---------+-----------+----------+--------------+ SFJ      Full                                                        +---------+---------------+---------+-----------+----------+--------------+ FV Prox  Full                                                        +---------+---------------+---------+-----------+----------+--------------+ FV Mid   Full                                                        +---------+---------------+---------+-----------+----------+--------------+ FV DistalFull                                                        +---------+---------------+---------+-----------+----------+--------------+ PFV      Full                                                        +---------+---------------+---------+-----------+----------+--------------+ POP      Full           Yes      Yes                                 +---------+---------------+---------+-----------+----------+--------------+ PTV      Full                                                        +---------+---------------+---------+-----------+----------+--------------+  PERO     Full                                                        +---------+---------------+---------+-----------+----------+--------------+   +---------+---------------+---------+-----------+----------+--------------+ LEFT     CompressibilityPhasicitySpontaneityPropertiesThrombus Aging  +---------+---------------+---------+-----------+----------+--------------+ CFV      Full           Yes      Yes                                 +---------+---------------+---------+-----------+----------+--------------+ SFJ      Full                                                        +---------+---------------+---------+-----------+----------+--------------+ FV Prox  Full                                                        +---------+---------------+---------+-----------+----------+--------------+ FV Mid   Full                                                        +---------+---------------+---------+-----------+----------+--------------+ FV DistalFull                                                        +---------+---------------+---------+-----------+----------+--------------+ PFV      Full                                                        +---------+---------------+---------+-----------+----------+--------------+ POP      Full           Yes      Yes                                 +---------+---------------+---------+-----------+----------+--------------+ PTV      Full                                                        +---------+---------------+---------+-----------+----------+--------------+ PERO     Full                                                        +---------+---------------+---------+-----------+----------+--------------+  Summary: RIGHT: - There is no evidence of deep vein thrombosis in the lower extremity.  - No cystic structure found in the popliteal fossa.  LEFT: - There is no evidence of deep vein thrombosis in the lower extremity.  - No cystic structure found in the popliteal fossa.  *See table(s) above for measurements and observations. Electronically signed by Servando Snare MD on 01/01/2020 at 3:55:18 PM.    Final     Labs:  CBC: No results for input(s): WBC, HGB, HCT, PLT in the last 8760  hours.  COAGS: No results for input(s): INR, APTT in the last 8760 hours.  BMP: No results for input(s): NA, K, CL, CO2, GLUCOSE, BUN, CALCIUM, CREATININE, GFRNONAA, GFRAA in the last 8760 hours.  Invalid input(s): CMP  LIVER FUNCTION TESTS: No results for input(s): BILITOT, AST, ALT, ALKPHOS, PROT, ALBUMIN in the last 8760 hours.  TUMOR MARKERS: No results for input(s): AFPTM, CEA, CA199, CHROMGRNA in the last 8760 hours.  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  All of the questions were answered and there is agreement to proceed.  Consent signed and in chart.   Thank you for this interesting consult.  I greatly enjoyed meeting Jason Nielsen and look forward to participating in their care.  A copy of this report was sent to the requesting provider on this date.  Electronically Signed: Lavonia Drafts, PA-C 01/08/2020, 7:20 AM   I spent a total of  30 Minutes   in face to face in clinical consultation, greater than 50% of which was counseling/coordinating care for right renal mass bx

## 2020-01-09 DIAGNOSIS — N2889 Other specified disorders of kidney and ureter: Secondary | ICD-10-CM | POA: Diagnosis not present

## 2020-01-09 DIAGNOSIS — I82220 Acute embolism and thrombosis of inferior vena cava: Secondary | ICD-10-CM | POA: Diagnosis not present

## 2020-01-09 DIAGNOSIS — D631 Anemia in chronic kidney disease: Secondary | ICD-10-CM | POA: Diagnosis not present

## 2020-01-09 DIAGNOSIS — I129 Hypertensive chronic kidney disease with stage 1 through stage 4 chronic kidney disease, or unspecified chronic kidney disease: Secondary | ICD-10-CM | POA: Diagnosis not present

## 2020-01-09 DIAGNOSIS — D49511 Neoplasm of unspecified behavior of right kidney: Secondary | ICD-10-CM | POA: Diagnosis not present

## 2020-01-09 DIAGNOSIS — N184 Chronic kidney disease, stage 4 (severe): Secondary | ICD-10-CM | POA: Diagnosis not present

## 2020-01-09 LAB — SURGICAL PATHOLOGY

## 2020-01-10 ENCOUNTER — Encounter: Payer: Self-pay | Admitting: Cardiology

## 2020-01-10 ENCOUNTER — Ambulatory Visit (INDEPENDENT_AMBULATORY_CARE_PROVIDER_SITE_OTHER): Payer: Medicare Other | Admitting: Cardiology

## 2020-01-10 ENCOUNTER — Other Ambulatory Visit: Payer: Self-pay

## 2020-01-10 VITALS — BP 131/59 | HR 74 | Ht 64.5 in | Wt 171.6 lb

## 2020-01-10 DIAGNOSIS — Z794 Long term (current) use of insulin: Secondary | ICD-10-CM | POA: Diagnosis not present

## 2020-01-10 DIAGNOSIS — R011 Cardiac murmur, unspecified: Secondary | ICD-10-CM | POA: Diagnosis not present

## 2020-01-10 DIAGNOSIS — Z7189 Other specified counseling: Secondary | ICD-10-CM | POA: Diagnosis not present

## 2020-01-10 DIAGNOSIS — E119 Type 2 diabetes mellitus without complications: Secondary | ICD-10-CM

## 2020-01-10 DIAGNOSIS — Z01810 Encounter for preprocedural cardiovascular examination: Secondary | ICD-10-CM | POA: Diagnosis not present

## 2020-01-10 DIAGNOSIS — Z8673 Personal history of transient ischemic attack (TIA), and cerebral infarction without residual deficits: Secondary | ICD-10-CM | POA: Diagnosis not present

## 2020-01-10 DIAGNOSIS — I10 Essential (primary) hypertension: Secondary | ICD-10-CM | POA: Diagnosis not present

## 2020-01-10 NOTE — Progress Notes (Signed)
Cardiology Office Note:    Date:  01/10/2020   ID:  Jason Nielsen, DOB 1940-03-07, MRN [id number]  PCP:  Marton Redwood, MD  Cardiologist:  Buford Dresser, MD  Referring MD: Raynelle Bring, MD   CC: new patient evaluation for preoperative cardiovascular risk assessment  History of Present Illness:    Jason Nielsen is a 80 y.o. male with a hx of right renal cell carcinoma, prostate cancer s/p brachytherapy, chronic kidney disease, type II diabetes, hypercholesterolemia, hypertension, CVA/TIA  who is seen as a new consult at the request of Raynelle Bring, MD for the evaluation and management of preoperative cardiovascular risk assessment.  Note from [name] dated 12/18/19 reviewed. No cardiac concerns noted, referred for preoperative risk assessment for non cardiac surgery. 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  Here with his daughter today. No other concerns.  Planned surgery: laparoscopic radical nephrectomy, date TBD  Pertinent past cardiac history: none Prior cardiac workup: none History of valve disease: none History of CAD/PAD/CVA/TIA: CVA 30 most recently, had another May 2002.  History of heart failure: none History of arrhythmia: none On anticoagulation: only on antiplatelet (clopidogrel >20 years), never on anticoagulation History of hypertension: yes, well controlled for many years History of diabetes: yes, just started insulin around July-August 2021. History of CKD: yes, followed by Dr. Candiss Norse at [facility] History of OSA: none History of anesthesia complications: none Current symptoms: Denies chest pain, shortness of breath at rest or with normal exertion. No PND, orthopnea, LE edema or unexpected weight gain. No syncope or palpitations. Functional capacity: rides exercise bike daily, has arm bars with it too, up to an hour at a time. Climbs stairs daily, only symptom is knee pain.  Per [facility], last Cr 2.00 12/18/19. Lipids 03/22/19 per [facility]  show Tchol 140, HDL 36, LDL 77, TG 135. Last A1c 7.8 03/22/19.   Past Medical History:  Diagnosis Date  . Chronic kidney disease   . Diabetes mellitus without complication ([place])     History reviewed. No pertinent surgical history.  Current Medications: Current Outpatient Medications on File Prior to Visit  Medication Sig  . allopurinol (ZYLOPRIM) 100 MG tablet Take 100 mg by mouth daily.  Marland Kitchen amLODipine (NORVASC) 10 MG tablet Take 10 mg by mouth daily.  Marland Kitchen atorvastatin (LIPITOR) 40 MG tablet Take 40 mg by mouth at bedtime.  . carvedilol (COREG) 25 MG tablet Take 25 mg by mouth 2 (two) times daily.  . clopidogrel (PLAVIX) 75 MG tablet Take 75 mg by mouth daily.  Marland Kitchen glimepiride (AMARYL) 4 MG tablet Take 4 mg by mouth daily.  Tyler Aas FLEXTOUCH 200 UNIT/ML FlexTouch Pen Inject 18 Units into the skin daily.  . valsartan (DIOVAN) 320 MG tablet Take 320 mg by mouth daily.   No current facility-administered medications on file prior to visit.     Allergies:   Patient has no known allergies.   Social History   Tobacco Use  . Smoking status: Former Smoker    Types: Cigars  . Smokeless tobacco: Never Used  Substance Use Topics  . Alcohol use: Not Currently    Comment: none >25 years    Family History: family history includes Heart attack in his brother; Stroke in his brother and brother.  ROS:   Please see the history of present illness.  Additional pertinent ROS: Constitutional: Negative for chills, fever, night sweats, unintentional weight loss  HENT: Negative for ear pain and hearing loss.   Eyes: Negative for loss of  vision and eye pain.  Respiratory: Negative for cough, sputum, wheezing.   Cardiovascular: See HPI. Gastrointestinal: Negative for abdominal pain, melena, and hematochezia.  Genitourinary: Negative for dysuria and hematuria.  Musculoskeletal: Negative for falls and myalgias.  Skin: Negative for itching and rash.  Neurological: Negative for focal weakness, focal  sensory changes and loss of consciousness. Has very mild residual weakness in legs and arms. Endo/Heme/Allergies: Does not bruise/bleed easily.     EKGs/Labs/Other Studies Reviewed:    The following studies were reviewed today: No prior cardiac studies  EKG:  EKG is personally reviewed.  The ekg ordered today demonstrates NSR at 74 bpm  Recent Labs: 01/08/2020: Hemoglobin 10.3; Platelets 241  Recent Lipid Panel No results found for: CHOL, TRIG, HDL, CHOLHDL, VLDL, LDLCALC, LDLDIRECT  Physical Exam:    VS:  BP (!) 131/59   Pulse 74   Ht 5' 4.5" (1.638 m)   Wt 171 lb 9.6 oz (77.8 kg)   SpO2 98%   BMI 29.00 kg/m     Wt Readings from Last 3 Encounters:  01/10/20 171 lb 9.6 oz (77.8 kg)  01/08/20 162 lb (73.5 kg)    GEN: Well nourished, well developed in no acute distress HEENT: Normal, moist mucous membranes NECK: No JVD CARDIAC: regular rhythm, normal S1 and S2, no rubs or gallops. 1/6 systolic murmur. VASCULAR: Radial and DP pulses 2+ bilaterally. No carotid bruits RESPIRATORY:  Clear to auscultation without rales, wheezing or rhonchi  ABDOMEN: Soft, non-tender, non-distended MUSCULOSKELETAL:  Ambulates independently SKIN: Warm and dry, no edema NEUROLOGIC:  Alert and oriented x 3. No focal neuro deficits noted. PSYCHIATRIC:  Normal affect    ASSESSMENT:    1. Preop cardiovascular exam   2. Essential hypertension   3. Type 2 diabetes mellitus without complication, with long-term current use of insulin ([place])   4. History of CVA (cerebrovascular accident)   5. Cardiac risk counseling   6. Counseling on health promotion and disease prevention   7. Murmur, cardiac    PLAN:    Preoperative risk assessment: Based on available date, patient's RCRI score = 4, which carries a 15% 30-day risk of death, MI, or cardiac arrest. However, these risk factors are non-cardiac (type of surgery, history of CVA, diabetes on insulin, Cr of 2). Therefore, he is at elevated risk of  complications due to non-cardiac conditions.   The patient is not currently having active cardiac symptoms, and they can achieve >4 METs of activity. He is very active at baseline, with his stationary bike routine very strenuous without any cardiac symptoms.  According to ACC/AHA Guidelines, no further testing is needed. Our service is available as needed in the peri-operative period.    Type II diabetes: A1c per report went from 14 to 7.8 on starting insulin.  Hypertension: Near goal today. No change to medications today  History of CVA: on clopidogrel and atorvastatin  Murmur: very quiet, low risk, no indication for echo based on guidelines  Cardiac risk counseling and prevention recommendations: -recommend heart healthy/Mediterranean diet, with whole grains, fruits, vegetable, fish, lean meats, nuts, and olive oil. Limit salt. -recommend moderate walking, 3-5 times/week for 30-50 minutes each session. Aim for at least 150 minutes.week. Goal should be pace of 3 miles/hours, or walking 1.5 miles in 30 minutes -recommend avoidance of tobacco products. Avoid excess alcohol.  Plan for follow up: as needed  Buford Dresser, MD, PhD [place]  [facility]    Medication Adjustments/Labs and Tests Ordered: Current medicines are  reviewed at length with the patient today.  Concerns regarding medicines are outlined above.  Orders Placed This Encounter  Procedures  . EKG 12-Lead   No orders of the defined types were placed in this encounter.   Patient Instructions  Medication Instructions:  Your Physician recommend you continue on your current medication as directed.    *If you need a refill on your cardiac medications before your next appointment, please call your pharmacy*   Lab Work: None   Testing/Procedures: None   Follow-Up: At [facility], you and your health needs are our priority.  As part of our continuing mission to provide you with exceptional heart  care, we have created designated Provider Care Teams.  These Care Teams include your primary Cardiologist (physician) and Advanced Practice Providers (APPs -  Physician Assistants and Nurse Practitioners) who all work together to provide you with the care you need, when you need it.  We recommend signing up for the patient portal called "MyChart".  Sign up information is provided on this After Visit Summary.  MyChart is used to connect with patients for Virtual Visits (Telemedicine).  Patients are able to view lab/test results, encounter notes, upcoming appointments, etc.  Non-urgent messages can be sent to your provider as well.   To learn more about what you can do with MyChart, go to [email or web address].    Your next appointment:   As needed  The format for your next appointment:   In Person  Provider:   Buford Dresser, MD      Signed, Buford Dresser, MD PhD 01/10/2020 12:57 PM    [place]

## 2020-01-10 NOTE — Patient Instructions (Signed)

## 2020-01-10 NOTE — Addendum Note (Signed)
Addended by: Buford Dresser A on: 01/10/2020 01:12 PM   Modules accepted: Level of Service

## 2020-01-18 ENCOUNTER — Other Ambulatory Visit: Payer: Self-pay

## 2020-01-18 DIAGNOSIS — N189 Chronic kidney disease, unspecified: Secondary | ICD-10-CM

## 2020-01-18 NOTE — Addendum Note (Signed)
Addended byDoylene Bode on: 01/18/2020 09:10 AM   Modules accepted: Orders

## 2020-01-24 DIAGNOSIS — I129 Hypertensive chronic kidney disease with stage 1 through stage 4 chronic kidney disease, or unspecified chronic kidney disease: Secondary | ICD-10-CM | POA: Diagnosis not present

## 2020-01-24 DIAGNOSIS — Z794 Long term (current) use of insulin: Secondary | ICD-10-CM | POA: Diagnosis not present

## 2020-01-24 DIAGNOSIS — N1832 Chronic kidney disease, stage 3b: Secondary | ICD-10-CM | POA: Diagnosis not present

## 2020-01-24 DIAGNOSIS — E1129 Type 2 diabetes mellitus with other diabetic kidney complication: Secondary | ICD-10-CM | POA: Diagnosis not present

## 2020-01-29 ENCOUNTER — Telehealth: Payer: Self-pay | Admitting: Cardiology

## 2020-01-29 ENCOUNTER — Other Ambulatory Visit: Payer: Self-pay | Admitting: Urology

## 2020-01-29 NOTE — Telephone Encounter (Signed)
   Primary Cardiologist: Buford Dresser, MD  Chart reviewed as part of pre-operative protocol coverage. Given past medical history and time since last visit, based on ACC/AHA guidelines, Jason Nielsen would be at acceptable risk for the planned procedure without further cardiovascular testing.   Patient's Plavix is not prescribed by cardiology.  Patient has a history of CVA/TIA and takes Plavix for CVA risk reduction.  Recommendations will need to come from PCP or prescribing provider for holding Plavix.  I will route this recommendation to the requesting party via Epic fax function and remove from pre-op pool.  Please call with questions.  Jossie Ng. Jim Lundin NP-C    01/29/2020, 11:35 AM [address] Office [phone] Fax [phone]

## 2020-01-29 NOTE — Telephone Encounter (Signed)
Forwarded to PCP for medication directions via EPIC fax function Forwarded to requesting providers office via EPIC fax function

## 2020-01-29 NOTE — Telephone Encounter (Signed)
   [facility] Pre-operative Risk Assessment    HEARTCARE STAFF: - Please ensure there is not already an duplicate clearance open for this procedure. - Under Visit Info/Reason for Call, type in Other and utilize the format Clearance MM/DD/YY or Clearance TBD. Do not use dashes or single digits. - If request is for dental extraction, please clarify the # of teeth to be extracted.  Request for surgical clearance:  1. What type of surgery is being performed? Right Laparoscopic nephrectomy   2. When is this surgery scheduled? 02/11/2020  3. What type of clearance is required (medical clearance vs. Pharmacy clearance to hold med vs. Both)? Both   4. Are there any medications that need to be held prior to surgery and how long? Hold Plavix  5 days prior and like for him to take one a day during the five days he is off Plavix would like for him to take Asprin 81 mg  5. Practice name and name of physician performing surgery? [facility]   6. What is the office phone number? [place]   7.   What is the office fax number? [phone]  8.   Anesthesia type (None, local, MAC, general) ? General    Jason Nielsen 01/29/2020, 11:18 AM  _________________________________________________________________   (provider comments below)

## 2020-01-31 NOTE — Progress Notes (Signed)
COVID Vaccine Completed:  x2 Date COVID Vaccine completed:  03-04-19 & 04-03-19 COVID vaccine manufacturer: [employer]   PCP - Marton Redwood, MD Cardiologist - Buford Dresser, MD  Chest x-ray -  EKG - 01-10-20 in Epic Stress Test -  ECHO -  Cardiac Cath -  Pacemaker/ICD device last checked:  Sleep Study -  CPAP -   Fasting Blood Sugar -  Checks Blood Sugar _____ times a day  Blood Thinner Instructions:  Plavix 75 mg Aspirin Instructions: Last Dose:  Anesthesia review:  Murmur, hx of CVA, DM, HTN, CKD  Patient denies shortness of breath, fever, cough and chest pain at PAT appointment   Patient verbalized understanding of instructions that were given to them at the PAT appointment. Patient was also instructed that they will need to review over the PAT instructions again at home before surgery.

## 2020-01-31 NOTE — Patient Instructions (Addendum)
DUE TO COVID-19 ONLY ONE VISITOR IS ALLOWED TO COME WITH YOU AND STAY IN THE WAITING ROOM ONLY DURING PRE OP AND PROCEDURE.   IF YOU WILL BE ADMITTED INTO THE HOSPITAL YOU ARE ALLOWED ONE SUPPORT PERSON DURING VISITATION HOURS ONLY (10AM -8PM)   . The support person may change daily. . The support person must pass our screening, gel in and out, and wear a mask at all times, including in the patient's room. . Patients must also wear a mask when staff or their support person are in the room.   COVID SWAB TESTING MUST BE COMPLETED ON:   Thursday, 02-07-20   [address] [place], [place] [address]  (Must self quarantine after testing. Follow instructions on handout.)    Your procedure is scheduled on:  Monday, 02-11-20   Report to [facility] Main  Entrance   Report to admitting at 9:15 AM   Call this number if you have problems the morning of surgery [phone]     Magnesium Citrate 1/2 bottle at noon the day before surgery   Do not eat food :After Midnight.   May have liquids until 8:15 AM day of surgery  CLEAR LIQUID DIET  Foods Allowed                                                                     Foods Excluded  Water, Black Coffee and tea, regular and decaf                liquids that you cannot  Plain Jell-O in any flavor  (No red)                                       see through such as: Fruit ices (not with fruit pulp)                                      milk, soups, orange juice              Iced Popsicles (No red)                                      All solid food                                   Apple juices Sports drinks like Gatorade (No red) Lightly seasoned clear broth or consume(fat free) Sugar, honey syrup    Oral Hygiene is also important to reduce your risk of infection.                                    Remember - BRUSH YOUR TEETH THE MORNING OF SURGERY WITH YOUR REGULAR TOOTHPASTE   Do NOT smoke after Midnight   Take these medicines the  morning of surgery with A SIP OF WATER:  Allopurinol, Amlodipine, Carvedilol                  How to Manage Your Diabetes Before and After Surgery  Why is it important to control my blood sugar before and after surgery? . Improving blood sugar levels before and after surgery helps healing and can limit problems. . A way of improving blood sugar control is eating a healthy diet by: o  Eating less sugar and carbohydrates o  Increasing activity/exercise o  Talking with your doctor about reaching your blood sugar goals . High blood sugars (greater than 180 mg/dL) can raise your risk of infections and slow your recovery, so you will need to focus on controlling your diabetes during the weeks before surgery. . Make sure that the doctor who takes care of your diabetes knows about your planned surgery including the date and location.  How do I manage my blood sugar before surgery? . Check your blood sugar at least 4 times a day, starting 2 days before surgery, to make sure that the level is not too high or low. o Check your blood sugar the morning of your surgery when you wake up and every 2 hours until you get to the Short Stay unit. . If your blood sugar is less than 70 mg/dL, you will need to treat for low blood sugar: o Do not take insulin. o Treat a low blood sugar (less than 70 mg/dL) with  cup of clear juice (cranberry or apple), 4 glucose tablets, OR glucose gel. o Recheck blood sugar in 15 minutes after treatment (to make sure it is greater than 70 mg/dL). If your blood sugar is not greater than 70 mg/dL on recheck, call [phone] for further instructions. . Report your blood sugar to the short stay nurse when you get to Short Stay.  . If you are admitted to the hospital after surgery: o Your blood sugar will be checked by the staff and you will probably be given insulin after surgery (instead of oral diabetes medicines) to make sure you have good blood sugar levels. o The goal for blood  sugar control after surgery is 80-180 mg/dL.   WHAT DO I DO ABOUT MY DIABETES MEDICATION?  Marland Kitchen Do not take oral diabetes medicines (pills) the morning of surgery.  . THE DAY BEFORE SURGERY:  Tresiba 100 u am, 50% PM                             Glimepiride do not take in the evening.       . THE MORNING OF SURGERY:  Tresiba 50% am                              Do not take Glimepiride.   Reviewed and Endorsed by [facility] Patient Education Committee, August 2015              You may not have any metal on your body including jewelry, and body piercings             Do not wear  lotions, powders, perfumes/cologne, or deodorant             Men may shave face and neck.   Do not bring valuables to the hospital. [place].   Contacts, dentures or bridgework may not be worn into  surgery.   Bring small overnight bag day of surgery.    Special Instructions: Bring a copy of your healthcare power of attorney and living will documents         the day of surgery if you haven't scanned them in before.              Please read over the following fact sheets you were given: IF YOU HAVE QUESTIONS ABOUT YOUR PRE OP INSTRUCTIONS PLEASE CALL  [place] - Preparing for Surgery Before surgery, you can play an important role.  Because skin is not sterile, your skin needs to be as free of germs as possible.  You can reduce the number of germs on your skin by washing with CHG (chlorahexidine gluconate) soap before surgery.  CHG is an antiseptic cleaner which kills germs and bonds with the skin to continue killing germs even after washing. Please DO NOT use if you have an allergy to CHG or antibacterial soaps.  If your skin becomes reddened/irritated stop using the CHG and inform your nurse when you arrive at Short Stay. Do not shave (including legs and underarms) for at least 48 hours prior to the first CHG shower.  You may shave your  face/neck.  Please follow these instructions carefully:  1.  Shower with CHG Soap the night before surgery and the  morning of surgery.  2.  If you choose to wash your hair, wash your hair first as usual with your normal  shampoo.  3.  After you shampoo, rinse your hair and body thoroughly to remove the shampoo.                             4.  Use CHG as you would any other liquid soap.  You can apply chg directly to the skin and wash.  Gently with a scrungie or clean washcloth.  5.  Apply the CHG Soap to your body ONLY FROM THE NECK DOWN.   Do   not use on face/ open                           Wound or open sores. Avoid contact with eyes, ears mouth and   genitals (private parts).                       Wash face,  Genitals (private parts) with your normal soap.             6.  Wash thoroughly, paying special attention to the area where your    surgery  will be performed.  7.  Thoroughly rinse your body with warm water from the neck down.  8.  DO NOT shower/wash with your normal soap after using and rinsing off the CHG Soap.                9.  Pat yourself dry with a clean towel.            10.  Wear clean pajamas.            11.  Place clean sheets on your bed the night of your first shower and do not  sleep with pets. Day of Surgery : Do not apply any lotions/deodorants the morning of surgery.  Please wear clean clothes to the hospital/surgery center.  FAILURE TO FOLLOW THESE INSTRUCTIONS MAY RESULT  IN THE CANCELLATION OF YOUR SURGERY  PATIENT SIGNATURE_________________________________  NURSE SIGNATURE__________________________________  ________________________________________________________________________   Adam Phenix  An incentive spirometer is a tool that can help keep your lungs clear and active. This tool measures how well you are filling your lungs with each breath. Taking long deep breaths may help reverse or decrease the chance of developing breathing (pulmonary)  problems (especially infection) following:  A long period of time when you are unable to move or be active. BEFORE THE PROCEDURE   If the spirometer includes an indicator to show your best effort, your nurse or respiratory therapist will set it to a desired goal.  If possible, sit up straight or lean slightly forward. Try not to slouch.  Hold the incentive spirometer in an upright position. INSTRUCTIONS FOR USE  1. Sit on the edge of your bed if possible, or sit up as far as you can in bed or on a chair. 2. Hold the incentive spirometer in an upright position. 3. Breathe out normally. 4. Place the mouthpiece in your mouth and seal your lips tightly around it. 5. Breathe in slowly and as deeply as possible, raising the piston or the ball toward the top of the column. 6. Hold your breath for 3-5 seconds or for as long as possible. Allow the piston or ball to fall to the bottom of the column. 7. Remove the mouthpiece from your mouth and breathe out normally. 8. Rest for a few seconds and repeat Steps 1 through 7 at least 10 times every 1-2 hours when you are awake. Take your time and take a few normal breaths between deep breaths. 9. The spirometer may include an indicator to show your best effort. Use the indicator as a goal to work toward during each repetition. 10. After each set of 10 deep breaths, practice coughing to be sure your lungs are clear. If you have an incision (the cut made at the time of surgery), support your incision when coughing by placing a pillow or rolled up towels firmly against it. Once you are able to get out of bed, walk around indoors and cough well. You may stop using the incentive spirometer when instructed by your caregiver.  RISKS AND COMPLICATIONS  Take your time so you do not get dizzy or light-headed.  If you are in pain, you may need to take or ask for pain medication before doing incentive spirometry. It is harder to take a deep breath if you are having  pain. AFTER USE  Rest and breathe slowly and easily.  It can be helpful to keep track of a log of your progress. Your caregiver can provide you with a simple table to help with this. If you are using the spirometer at home, follow these instructions: [place] IF:   You are having difficultly using the spirometer.  You have trouble using the spirometer as often as instructed.  Your pain medication is not giving enough relief while using the spirometer.  You develop fever of 100.5 F (38.1 C) or higher. SEEK IMMEDIATE MEDICAL CARE IF:   You cough up bloody sputum that had not been present before.  You develop fever of 102 F (38.9 C) or greater.  You develop worsening pain at or near the incision site. MAKE SURE YOU:   Understand these instructions.  Will watch your condition.  Will get help right away if you are not doing well or get worse. Document Released: 05/03/2006 Document Revised: 03/15/2011 Document Reviewed: 07/04/2006  [employer] Patient Information 2014 [employer], [place].   ________________________________________________________________________  WHAT IS A BLOOD TRANSFUSION? Blood Transfusion Information  A transfusion is the replacement of blood or some of its parts. Blood is made up of multiple cells which provide different functions.  Red blood cells carry oxygen and are used for blood loss replacement.  White blood cells fight against infection.  Platelets control bleeding.  Plasma helps clot blood.  Other blood products are available for specialized needs, such as hemophilia or other clotting disorders. BEFORE THE TRANSFUSION  Who gives blood for transfusions?   Healthy volunteers who are fully evaluated to make sure their blood is safe. This is blood bank blood. Transfusion therapy is the safest it has ever been in the practice of medicine. Before blood is taken from a donor, a complete history is taken to make sure that person has no history  of diseases nor engages in risky social behavior (examples are intravenous drug use or sexual activity with multiple partners). The donor's travel history is screened to minimize risk of transmitting infections, such as malaria. The donated blood is tested for signs of infectious diseases, such as HIV and hepatitis. The blood is then tested to be sure it is compatible with you in order to minimize the chance of a transfusion reaction. If you or a relative donates blood, this is often done in anticipation of surgery and is not appropriate for emergency situations. It takes many days to process the donated blood. RISKS AND COMPLICATIONS Although transfusion therapy is very safe and saves many lives, the main dangers of transfusion include:   Getting an infectious disease.  Developing a transfusion reaction. This is an allergic reaction to something in the blood you were given. Every precaution is taken to prevent this. The decision to have a blood transfusion has been considered carefully by your caregiver before blood is given. Blood is not given unless the benefits outweigh the risks. AFTER THE TRANSFUSION  Right after receiving a blood transfusion, you will usually feel much better and more energetic. This is especially true if your red blood cells have gotten low (anemic). The transfusion raises the level of the red blood cells which carry oxygen, and this usually causes an energy increase.  The nurse administering the transfusion will monitor you carefully for complications. HOME CARE INSTRUCTIONS  No special instructions are needed after a transfusion. You may find your energy is better. Speak with your caregiver about any limitations on activity for underlying diseases you may have. SEEK MEDICAL CARE IF:   Your condition is not improving after your transfusion.  You develop redness or irritation at the intravenous (IV) site. SEEK IMMEDIATE MEDICAL CARE IF:  Any of the following symptoms  occur over the next 12 hours:  Shaking chills.  You have a temperature by mouth above 102 F (38.9 C), not controlled by medicine.  Chest, back, or muscle pain.  People around you feel you are not acting correctly or are confused.  Shortness of breath or difficulty breathing.  Dizziness and fainting.  You get a rash or develop hives.  You have a decrease in urine output.  Your urine turns a dark color or changes to pink, red, or brown. Any of the following symptoms occur over the next 10 days:  You have a temperature by mouth above 102 F (38.9 C), not controlled by medicine.  Shortness of breath.  Weakness after normal activity.  The white part of the eye turns yellow (jaundice).  You  have a decrease in the amount of urine or are urinating less often.  Your urine turns a dark color or changes to pink, red, or brown. Document Released: 12/19/1999 Document Revised: 03/15/2011 Document Reviewed: 08/07/2007 [facility] Patient Information 2014 [place], [place].  _______________________________________________________________________

## 2020-02-01 ENCOUNTER — Inpatient Hospital Stay (HOSPITAL_COMMUNITY)
Admission: RE | Admit: 2020-02-01 | Discharge: 2020-02-01 | Disposition: A | Discharge status: Home / Self Care | Payer: Medicare Other | Source: Ambulatory Visit

## 2020-02-04 ENCOUNTER — Encounter (HOSPITAL_COMMUNITY)
Admission: RE | Admit: 2020-02-04 | Discharge: 2020-02-04 | Disposition: A | Discharge status: Home / Self Care | Payer: Medicare Other | Source: Ambulatory Visit | Attending: Urology | Admitting: Urology

## 2020-02-04 ENCOUNTER — Other Ambulatory Visit: Payer: Self-pay

## 2020-02-04 ENCOUNTER — Encounter (HOSPITAL_COMMUNITY): Payer: Self-pay

## 2020-02-04 DIAGNOSIS — Z01812 Encounter for preprocedural laboratory examination: Secondary | ICD-10-CM | POA: Diagnosis not present

## 2020-02-04 HISTORY — DX: Essential (primary) hypertension: I10

## 2020-02-04 HISTORY — DX: Malignant neoplasm of prostate: C61

## 2020-02-04 HISTORY — DX: Unspecified osteoarthritis, unspecified site: M19.90

## 2020-02-04 HISTORY — DX: Cerebral infarction, unspecified: I63.9

## 2020-02-04 HISTORY — DX: Malignant neoplasm of unspecified kidney, except renal pelvis: C64.9

## 2020-02-04 LAB — BASIC METABOLIC PANEL
Anion gap: 11 (ref 5–15)
BUN: 35 mg/dL — ABNORMAL HIGH (ref 8–23)
CO2: 22 mmol/L (ref 22–32)
Calcium: 9.2 mg/dL (ref 8.9–10.3)
Chloride: 107 mmol/L (ref 98–111)
Creatinine, Ser: 2.14 mg/dL — ABNORMAL HIGH (ref 0.61–1.24)
GFR, Estimated: 31 mL/min — ABNORMAL LOW (ref >60)
Glucose, Bld: 165 mg/dL — ABNORMAL HIGH (ref 70–99)
Potassium: 4.7 mmol/L (ref 3.5–5.1)
Sodium: 140 mmol/L (ref 135–145)

## 2020-02-04 LAB — CBC
HCT: 32.3 % — ABNORMAL LOW (ref 39.0–52.0)
Hemoglobin: 9.9 g/dL — ABNORMAL LOW (ref 13.0–17.0)
MCH: 30.7 pg (ref 26.0–34.0)
MCHC: 30.7 g/dL (ref 30.0–36.0)
MCV: 100 fL (ref 80.0–100.0)
Platelets: 224 10*3/uL (ref 150–400)
RBC: 3.23 MIL/uL — ABNORMAL LOW (ref 4.22–5.81)
RDW: 15 % (ref 11.5–15.5)
WBC: 6.7 10*3/uL (ref 4.0–10.5)
nRBC: 0 % (ref 0.0–0.2)

## 2020-02-04 LAB — HEMOGLOBIN A1C
Hgb A1c MFr Bld: 7.9 % — ABNORMAL HIGH (ref 4.8–5.6)
Mean Plasma Glucose: 180.03 mg/dL

## 2020-02-04 LAB — GLUCOSE, CAPILLARY: Glucose-Capillary: 134 mg/dL — ABNORMAL HIGH (ref 70–99)

## 2020-02-04 NOTE — Patient Instructions (Addendum)
DUE TO COVID-19 ONLY ONE VISITOR IS ALLOWED TO COME WITH YOU AND STAY IN THE WAITING ROOM ONLY DURING PRE OP AND PROCEDURE.   IF YOU WILL BE ADMITTED INTO THE HOSPITAL YOU ARE ALLOWED ONE SUPPORT PERSON DURING VISITATION HOURS ONLY (10AM -8PM)   . The support person may change daily. . The support person must pass our screening, gel in and out, and wear a mask at all times, including in the patient's room. . Patients must also wear a mask when staff or their support person are in the room.   COVID SWAB TESTING MUST BE COMPLETED ON:   Thursday, 02-07-20 @ 10:05 AM   [address] [place], [place] [address]  (Must self quarantine after testing. Follow instructions on handout.)        Your procedure is scheduled on:  Monday, 02-11-20   Report to [facility] Main  Entrance    Report to admitting at 9:15 AM   Call this number if you have problems the morning of surgery [phone]   Do not eat food :After Midnight.   May have liquids until 8:15 AM day of surgery  CLEAR LIQUID DIET  Foods Allowed                                                                     Foods Excluded  Water, Black Coffee and tea, regular and decaf             liquids that you cannot  Plain Jell-O in any flavor  (No red)                                    see through such as: Fruit ices (not with fruit pulp)                                      milk, soups, orange juice              Iced Popsicles (No red)                                      All solid food                                   Apple juices Sports drinks like Gatorade (No red) Lightly seasoned clear broth or consume(fat free) Sugar, honey syrup      Oral Hygiene is also important to reduce your risk of infection.                                    Remember - BRUSH YOUR TEETH THE MORNING OF SURGERY WITH YOUR REGULAR TOOTHPASTE   Do NOT smoke after Midnight   Take these medicines the morning of surgery with A SIP OF WATER:  Allopurinol, Amlodipine, Carvedilol, Atorvastatin  You may not have any metal on your body including  jewelry, and body piercings             Do not wear lotions, powders, perfumes/cologne, or deodorant             Men may shave face and neck.   Do not bring valuables to the hospital. [place].   Contacts, dentures or bridgework may not be worn into surgery.   Bring small overnight bag day of surgery.                  Please read over the following fact sheets you were given: IF YOU HAVE QUESTIONS ABOUT YOUR PRE OP INSTRUCTIONS PLEASE CALL  [place]   How to Manage Your Diabetes Before and After Surgery  Why is it important to control my blood sugar before and after surgery? . Improving blood sugar levels before and after surgery helps healing and can limit problems. . A way of improving blood sugar control is eating a healthy diet by: o  Eating less sugar and carbohydrates o  Increasing activity/exercise o  Talking with your doctor about reaching your blood sugar goals . High blood sugars (greater than 180 mg/dL) can raise your risk of infections and slow your recovery, so you will need to focus on controlling your diabetes during the weeks before surgery. . Make sure that the doctor who takes care of your diabetes knows about your planned surgery including the date and location.  How do I manage my blood sugar before surgery? . Check your blood sugar at least 4 times a day, starting 2 days before surgery, to make sure that the level is not too high or low. o Check your blood sugar the morning of your surgery when you wake up and every 2 hours until you get to the Short Stay unit. . If your blood sugar is less than 70 mg/dL, you will need to treat for low blood sugar: o Do not take insulin. o Treat a low blood sugar (less than 70 mg/dL) with  cup of clear juice (cranberry or apple), 4 glucose tablets, OR  glucose gel. o Recheck blood sugar in 15 minutes after treatment (to make sure it is greater than 70 mg/dL). If your blood sugar is not greater than 70 mg/dL on recheck, call [phone] for further instructions. . Report your blood sugar to the short stay nurse when you get to Short Stay.  . If you are admitted to the hospital after surgery: o Your blood sugar will be checked by the staff and you will probably be given insulin after surgery (instead of oral diabetes medicines) to make sure you have good blood sugar levels. o The goal for blood sugar control after surgery is 80-180 mg/dL.   WHAT DO I DO ABOUT MY DIABETES MEDICATION?  Marland Kitchen Do not take oral diabetes medicines (pills) the morning of surgery.  . THE NIGHT BEFORE SURGERY:   Take Tresiba 18 units .  Marland Kitchen THE MORNING OF SURGERY:   Take 9 units of Tresiba   Reviewed and Endorsed by [facility] Patient Education Committee, August 2015  [facility] - Preparing for Surgery Before surgery, you can play an important role.  Because skin is not sterile, your skin needs to be as free of germs as possible.  You can reduce the number of germs on your skin by washing with CHG (chlorahexidine gluconate) soap before surgery.  CHG is an antiseptic cleaner which kills germs and bonds with the skin to continue killing germs even after washing. Please DO NOT use if you have an allergy to CHG or antibacterial soaps.  If your skin becomes reddened/irritated stop using the CHG and inform your nurse when you arrive at Short Stay. Do not shave (including legs and underarms) for at least 48 hours prior to the first CHG shower.  You may shave your face/neck.  Please follow these instructions carefully:  1.  Shower with CHG Soap the night before surgery and the  morning of surgery.  2.  If you choose to wash your hair, wash your hair first as usual with your normal  shampoo.  3.  After you shampoo, rinse your hair and body thoroughly to remove the shampoo.                              4.  Use CHG as you would any other liquid soap.  You can apply chg directly to the skin and wash.  Gently with a scrungie or clean washcloth.  5.  Apply the CHG Soap to your body ONLY FROM THE NECK DOWN.   Do   not use on face/ open                           Wound or open sores. Avoid contact with eyes, ears mouth and   genitals (private parts).                       Wash face,  Genitals (private parts) with your normal soap.             6.  Wash thoroughly, paying special attention to the area where your    surgery  will be performed.  7.  Thoroughly rinse your body with warm water from the neck down.  8.  DO NOT shower/wash with your normal soap after using and rinsing off the CHG Soap.                9.  Pat yourself dry with a clean towel.            10.  Wear clean pajamas.            11.  Place clean sheets on your bed the night of your first shower and do not  sleep with pets. Day of Surgery : Do not apply any lotions/deodorants the morning of surgery.  Please wear clean clothes to the hospital/surgery center.  FAILURE TO FOLLOW THESE INSTRUCTIONS MAY RESULT IN THE CANCELLATION OF YOUR SURGERY  PATIENT SIGNATURE_________________________________  NURSE SIGNATURE__________________________________  ________________________________________________________________________   Adam Phenix  An incentive spirometer is a tool that can help keep your lungs clear and active. This tool measures how well you are filling your lungs with each breath. Taking long deep breaths may help reverse or decrease the chance of developing breathing (pulmonary) problems (especially infection) following:  A long period of time when you are unable to move or be active. BEFORE THE PROCEDURE   If the spirometer includes an indicator to show your best effort, your nurse or respiratory therapist will set it to a desired goal.  If possible, sit up straight or lean slightly  forward. Try not to slouch.  Hold the incentive spirometer in an upright position. INSTRUCTIONS FOR USE  1.  Sit on the edge of your bed if possible, or sit up as far as you can in bed or on a chair. 2. Hold the incentive spirometer in an upright position. 3. Breathe out normally. 4. Place the mouthpiece in your mouth and seal your lips tightly around it. 5. Breathe in slowly and as deeply as possible, raising the piston or the ball toward the top of the column. 6. Hold your breath for 3-5 seconds or for as long as possible. Allow the piston or ball to fall to the bottom of the column. 7. Remove the mouthpiece from your mouth and breathe out normally. 8. Rest for a few seconds and repeat Steps 1 through 7 at least 10 times every 1-2 hours when you are awake. Take your time and take a few normal breaths between deep breaths. 9. The spirometer may include an indicator to show your best effort. Use the indicator as a goal to work toward during each repetition. 10. After each set of 10 deep breaths, practice coughing to be sure your lungs are clear. If you have an incision (the cut made at the time of surgery), support your incision when coughing by placing a pillow or rolled up towels firmly against it. Once you are able to get out of bed, walk around indoors and cough well. You may stop using the incentive spirometer when instructed by your caregiver.  RISKS AND COMPLICATIONS  Take your time so you do not get dizzy or light-headed.  If you are in pain, you may need to take or ask for pain medication before doing incentive spirometry. It is harder to take a deep breath if you are having pain. AFTER USE  Rest and breathe slowly and easily.  It can be helpful to keep track of a log of your progress. Your caregiver can provide you with a simple table to help with this. If you are using the spirometer at home, follow these instructions: [place] IF:   You are having difficultly using the  spirometer.  You have trouble using the spirometer as often as instructed.  Your pain medication is not giving enough relief while using the spirometer.  You develop fever of 100.5 F (38.1 C) or higher. SEEK IMMEDIATE MEDICAL CARE IF:   You cough up bloody sputum that had not been present before.  You develop fever of 102 F (38.9 C) or greater.  You develop worsening pain at or near the incision site. MAKE SURE YOU:   Understand these instructions.  Will watch your condition.  Will get help right away if you are not doing well or get worse. Document Released: 05/03/2006 Document Revised: 03/15/2011 Document Reviewed: 07/04/2006 ExitCare Patient Information 2014 ExitCare, [place].   ________________________________________________________________________  WHAT IS A BLOOD TRANSFUSION? Blood Transfusion Information  A transfusion is the replacement of blood or some of its parts. Blood is made up of multiple cells which provide different functions.  Red blood cells carry oxygen and are used for blood loss replacement.  White blood cells fight against infection.  Platelets control bleeding.  Plasma helps clot blood.  Other blood products are available for specialized needs, such as hemophilia or other clotting disorders. BEFORE THE TRANSFUSION  Who gives blood for transfusions?   Healthy volunteers who are fully evaluated to make sure their blood is safe. This is blood bank blood. Transfusion therapy is the safest it has ever been in the practice of medicine. Before blood is taken from a donor, a  complete history is taken to make sure that person has no history of diseases nor engages in risky social behavior (examples are intravenous drug use or sexual activity with multiple partners). The donor's travel history is screened to minimize risk of transmitting infections, such as malaria. The donated blood is tested for signs of infectious diseases, such as HIV and hepatitis.  The blood is then tested to be sure it is compatible with you in order to minimize the chance of a transfusion reaction. If you or a relative donates blood, this is often done in anticipation of surgery and is not appropriate for emergency situations. It takes many days to process the donated blood. RISKS AND COMPLICATIONS Although transfusion therapy is very safe and saves many lives, the main dangers of transfusion include:   Getting an infectious disease.  Developing a transfusion reaction. This is an allergic reaction to something in the blood you were given. Every precaution is taken to prevent this. The decision to have a blood transfusion has been considered carefully by your caregiver before blood is given. Blood is not given unless the benefits outweigh the risks. AFTER THE TRANSFUSION  Right after receiving a blood transfusion, you will usually feel much better and more energetic. This is especially true if your red blood cells have gotten low (anemic). The transfusion raises the level of the red blood cells which carry oxygen, and this usually causes an energy increase.  The nurse administering the transfusion will monitor you carefully for complications. HOME CARE INSTRUCTIONS  No special instructions are needed after a transfusion. You may find your energy is better. Speak with your caregiver about any limitations on activity for underlying diseases you may have. SEEK MEDICAL CARE IF:   Your condition is not improving after your transfusion.  You develop redness or irritation at the intravenous (IV) site. SEEK IMMEDIATE MEDICAL CARE IF:  Any of the following symptoms occur over the next 12 hours:  Shaking chills.  You have a temperature by mouth above 102 F (38.9 C), not controlled by medicine.  Chest, back, or muscle pain.  People around you feel you are not acting correctly or are confused.  Shortness of breath or difficulty breathing.  Dizziness and fainting.  You  get a rash or develop hives.  You have a decrease in urine output.  Your urine turns a dark color or changes to pink, red, or brown. Any of the following symptoms occur over the next 10 days:  You have a temperature by mouth above 102 F (38.9 C), not controlled by medicine.  Shortness of breath.  Weakness after normal activity.  The white part of the eye turns yellow (jaundice).  You have a decrease in the amount of urine or are urinating less often.  Your urine turns a dark color or changes to pink, red, or brown. Document Released: 12/19/1999 Document Revised: 03/15/2011 Document Reviewed: 08/07/2007 [facility] Patient Information 2014 [place], [place].  _______________________________________________________________________

## 2020-02-04 NOTE — Progress Notes (Addendum)
COVID Vaccine Completed:  x3 Date COVID Vaccine completed:  03-04-19 & 04-03-19  Booster 12-10-19 COVID vaccine manufacturer: [place]   PCP - Marton Redwood, MD Cardiologist - Buford Dresser, MD.  Last OV 01-10-20 with clearance.  Chest x-ray -  EKG - 01-10-20 in Epic Stress Test -  ECHO -  Cardiac Cath -  Pacemaker/ICD device last checked:  Sleep Study - N/A CPAP -   Fasting Blood Sugar - 97 to 150 Checks Blood Sugar -2  times a day  Blood Thinner Instructions:  Plavix 75 mg Aspirin Instructions:  ASA 81 mg to start while off Plavix per pt Last Dose:  02-03-20  Anesthesia review:  Murmur, hx of CVA, DM, HTN, CKD  Patient denies shortness of breath, fever, cough and chest pain at PAT appointment.  Pt able to climb and flight of stairs and do housework.   Patient verbalized understanding of instructions that were given to them at the PAT appointment. Patient was also instructed that they will need to review over the PAT instructions again at home before surgery.

## 2020-02-04 NOTE — Progress Notes (Addendum)
CBC , BMP and A1c sent to [name] to review.

## 2020-02-05 ENCOUNTER — Ambulatory Visit (INDEPENDENT_AMBULATORY_CARE_PROVIDER_SITE_OTHER): Payer: Medicare Other | Admitting: Vascular Surgery

## 2020-02-05 ENCOUNTER — Ambulatory Visit (HOSPITAL_COMMUNITY)
Admission: RE | Admit: 2020-02-05 | Discharge: 2020-02-05 | Disposition: A | Discharge status: Home / Self Care | Payer: Medicare Other | Source: Ambulatory Visit | Attending: Vascular Surgery | Admitting: Vascular Surgery

## 2020-02-05 ENCOUNTER — Other Ambulatory Visit: Payer: Self-pay

## 2020-02-05 ENCOUNTER — Ambulatory Visit (INDEPENDENT_AMBULATORY_CARE_PROVIDER_SITE_OTHER)
Admission: RE | Admit: 2020-02-05 | Discharge: 2020-02-05 | Disposition: A | Discharge status: Home / Self Care | Payer: Medicare Other | Source: Ambulatory Visit | Attending: Vascular Surgery | Admitting: Vascular Surgery

## 2020-02-05 ENCOUNTER — Encounter: Payer: Self-pay | Admitting: Vascular Surgery

## 2020-02-05 VITALS — BP 131/68 | HR 93 | Temp 98.1°F | Resp 20 | Ht 64.0 in | Wt 172.0 lb

## 2020-02-05 DIAGNOSIS — N184 Chronic kidney disease, stage 4 (severe): Secondary | ICD-10-CM

## 2020-02-05 DIAGNOSIS — N189 Chronic kidney disease, unspecified: Secondary | ICD-10-CM

## 2020-02-05 NOTE — H&P (View-Only) (Signed)
ASSESSMENT & PLAN:  80 y.o. 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  He is scheduled to undergo laparoscopic radical nephrectomy 02/11/2020 with [name].  I will plan to perform a left upper extremity brachiobasilic AV fistula 02/05/9756.  Counseled the patient that intraoperative changes in blood pressure would expose this fistula to a higher risk of thrombosis.  I explained that we could wait to create this fistula until after he is recovered from his nephrectomy, but this would extend the time required for maturation.  If he requires dialysis more urgently, tunneled dialysis catheter can be placed.  Both he and his daughter expressed understanding and wished to proceed as soon as possible with fistula creation.   CHIEF COMPLAINT:   In need of permanent dialysis access  HISTORY:  HISTORY OF PRESENT ILLNESS: Jason Nielsen is a 80 y.o. male for to clinic for evaluation of dialysis access creation.  He has a history significant for type 2 diabetes, stroke x2 with minimal residual deficit, hyperlipidemia, gout, chronic kidney disease stage IV, hypertensive retinopathy, prostate cancer status post brachytherapy.  In October 2021 he underwent a renal ultrasound which showed a 6.5 cm solid-appearing right renal mass.  He has been evaluated by [name] from [facility] urology who is recommended nephrectomy.  Dr. Candiss Norse from [facility] is his nephrologist.  The concern is that after the nephrectomy he will develop end-stage renal disease.  He presents the clinic today with his daughter.  Patient is a right-handed gentleman.  He has no history of surgery or trauma to the upper extremities.  No history of pacemaker or central venous catheter.  Past Medical History:  Diagnosis Date  . Arthritis   . Chronic kidney disease   . Diabetes mellitus without complication ([place])   . Hypertension   . Prostate cancer ([place])   . Renal cancer ([place])    Right  . Stroke [facility])     Past Surgical History:   Procedure Laterality Date  . PROSTATE BIOPSY    . RENAL BIOPSY    . TRANSPERINEAL IMPLANT OF RADIATION SEEDS W/ ULTRASOUND      Family History  Problem Relation Age of Onset  . Heart attack Brother   . Stroke Brother   . Stroke Brother     Social History   Socioeconomic History  . Marital status: Married    Spouse name: Not on file  . Number of children: Not on file  . Years of education: Not on file  . Highest education level: Not on file  Occupational History  . Not on file  Tobacco Use  . Smoking status: Former Smoker    Types: Cigars  . Smokeless tobacco: Never Used  . Tobacco comment: Quit 20 years ago  Vaping Use  . Vaping Use: Never used  Substance and Sexual Activity  . Alcohol use: Not Currently    Comment: none >25 years  . Drug use: Never  . Sexual activity: Not on file  Other Topics Concern  . Not on file  Social History Narrative  . Not on file   Social Determinants of Health   Financial Resource Strain: Not on file  Food Insecurity: Not on file  Transportation Needs: Not on file  Physical Activity: Not on file  Stress: Not on file  Social Connections: Not on file  Intimate Partner Violence: Not on file    No Known Allergies  Current Outpatient Medications  Medication Sig Dispense Refill  . allopurinol (ZYLOPRIM) 100  MG tablet Take 100 mg by mouth daily.    Marland Kitchen amLODipine (NORVASC) 10 MG tablet Take 10 mg by mouth daily.    Marland Kitchen atorvastatin (LIPITOR) 40 MG tablet Take 40 mg by mouth daily.    . carvedilol (COREG) 25 MG tablet Take 25 mg by mouth 2 (two) times daily.    . clopidogrel (PLAVIX) 75 MG tablet Take 75 mg by mouth daily.    . ferrous sulfate 325 (65 FE) MG tablet Take 325 mg by mouth daily.    Tyler Aas FLEXTOUCH 200 UNIT/ML FlexTouch Pen Inject 18 Units into the skin daily.    . valsartan (DIOVAN) 320 MG tablet Take 320 mg by mouth daily.     No current facility-administered medications for this visit.    REVIEW OF SYSTEMS:   [X]  denotes positive finding, [ ]  denotes negative finding Cardiac  Comments:  Chest pain or chest pressure: x   Shortness of breath upon exertion: x   Short of breath when lying flat: x   Irregular heart rhythm: x       Vascular    Pain in calf, thigh, or hip brought on by ambulation:    Pain in feet at night that wakes you up from your sleep:     Blood clot in your veins:    Leg swelling:         Pulmonary    Oxygen at home: x   Productive cough:  x   Wheezing:  x       Neurologic    Sudden weakness in arms or legs:  x   Sudden numbness in arms or legs:  x   Sudden onset of difficulty speaking or slurred speech: x   Temporary loss of vision in one eye:  x   Problems with dizziness:  x       Gastrointestinal    Blood in stool:  x   Vomited blood:  x       Genitourinary    Burning when urinating:  x   Blood in urine: x       Psychiatric    Major depression:  x       Hematologic    Bleeding problems: x   Problems with blood clotting too easily: x       Skin    Rashes or ulcers: x       Constitutional    Fever or chills: x    PHYSICAL EXAM:   Vitals:   02/05/20 0835  BP: 131/68  Pulse: 93  Resp: 20  Temp: 98.1 F (36.7 C)  SpO2: 99%  Weight: 172 lb (78 kg)  Height: 5\' 4"  (1.626 m)   Constitutional: well appearing in no distress. Appears well nourished.  Neurologic: CN intact. No focal findings. No sensory loss. Psychiatric: Mood and affect symmetric and appropriate. Eyes: No icterus. No conjunctival pallor. Ears, nose, throat: mucous membranes moist. Midline trachea.  Cardiac: Regular rate and rhythm.  Respiratory: unlabored. Abdominal: soft, non-tender, non-distended.  Peripheral vascular:   palpable left brachial and radial pulse.   No scars across the left arm  No catheters, pacemakers or scars across the chest Extremity: No edema. No cyanosis. No pallor.  Skin: No gangrene. No ulceration.  Lymphatic: No Stemmer's sign. No palpable  lymphadenopathy.   DATA REVIEW:    Most recent CBC CBC Latest Ref Rng & Units 02/04/2020 01/08/2020 04/15/2008  WBC 4.0 - 10.5 K/uL 6.7 5.5 4.0  Hemoglobin 13.0 - 17.0  g/dL 9.9(L) 10.3(L) 12.7(L)  Hematocrit 39.0 - 52.0 % 32.3(L) 34.3(L) 38.2(L)  Platelets 150 - 400 K/uL 224 241 204     Most recent CMP CMP Latest Ref Rng & Units 02/04/2020 04/15/2008  Glucose 70 - 99 mg/dL 165(H) 242(H)  BUN 8 - 23 mg/dL 35(H) 17  Creatinine 0.61 - 1.24 mg/dL 2.14(H) 1.49  Sodium 135 - 145 mmol/L 140 137  Potassium 3.5 - 5.1 mmol/L 4.7 3.9  Chloride 98 - 111 mmol/L 107 104  CO2 22 - 32 mmol/L 22 27  Calcium 8.9 - 10.3 mg/dL 9.2 9.7  Total Protein 6.0 - 8.3 g/dL - 6.7  Total Bilirubin 0.3 - 1.2 mg/dL - 1.0  Alkaline Phos 39 - 117 U/L - 76  AST 0 - 37 U/L - 20  ALT 0 - 53 U/L - 17    Renal function Estimated Creatinine Clearance: 26 mL/min (A) (by C-G formula based on SCr of 2.14 mg/dL (H)).  Hgb A1c MFr Bld (%)  Date Value  02/04/2020 7.9 (H)    No results found for: LDLCALC, LDLC, HIRISKLDL, POCLDL, LDLDIRECT, REALLDLC, TOTLDLC   Vascular Imaging: Arterial and venous duplex 02/05/20 Personally reviewed Adequate arterial inflow for AVF Left basilic is adequate for AVF  [employer]. Stanford Breed, MD [facility] Phone Number: [phone] 02/05/2020 8:49 AM

## 2020-02-05 NOTE — Progress Notes (Signed)
ASSESSMENT & PLAN:  80 y.o. 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  He is scheduled to undergo laparoscopic radical nephrectomy 02/11/2020 with [name].  I will plan to perform a left upper extremity brachiobasilic AV fistula 04/08/8197.  Counseled the patient that intraoperative changes in blood pressure would expose this fistula to a higher risk of thrombosis.  I explained that we could wait to create this fistula until after he is recovered from his nephrectomy, but this would extend the time required for maturation.  If he requires dialysis more urgently, tunneled dialysis catheter can be placed.  Both he and his daughter expressed understanding and wished to proceed as soon as possible with fistula creation.   CHIEF COMPLAINT:   In need of permanent dialysis access  HISTORY:  HISTORY OF PRESENT ILLNESS: Jason Nielsen is a 80 y.o. male for to clinic for evaluation of dialysis access creation.  He has a history significant for type 2 diabetes, stroke x2 with minimal residual deficit, hyperlipidemia, gout, chronic kidney disease stage IV, hypertensive retinopathy, prostate cancer status post brachytherapy.  In October 2021 he underwent a renal ultrasound which showed a 6.5 cm solid-appearing right renal mass.  He has been evaluated by [name] from [facility] urology who is recommended nephrectomy.  Dr. Candiss Norse from [facility] is his nephrologist.  The concern is that after the nephrectomy he will develop end-stage renal disease.  He presents the clinic today with his daughter.  Patient is a right-handed gentleman.  He has no history of surgery or trauma to the upper extremities.  No history of pacemaker or central venous catheter.  Past Medical History:  Diagnosis Date  . Arthritis   . Chronic kidney disease   . Diabetes mellitus without complication ([place])   . Hypertension   . Prostate cancer ([place])   . Renal cancer ([place])    Right  . Stroke [facility])     Past Surgical History:   Procedure Laterality Date  . PROSTATE BIOPSY    . RENAL BIOPSY    . TRANSPERINEAL IMPLANT OF RADIATION SEEDS W/ ULTRASOUND      Family History  Problem Relation Age of Onset  . Heart attack Brother   . Stroke Brother   . Stroke Brother     Social History   Socioeconomic History  . Marital status: Married    Spouse name: Not on file  . Number of children: Not on file  . Years of education: Not on file  . Highest education level: Not on file  Occupational History  . Not on file  Tobacco Use  . Smoking status: Former Smoker    Types: Cigars  . Smokeless tobacco: Never Used  . Tobacco comment: Quit 20 years ago  Vaping Use  . Vaping Use: Never used  Substance and Sexual Activity  . Alcohol use: Not Currently    Comment: none >25 years  . Drug use: Never  . Sexual activity: Not on file  Other Topics Concern  . Not on file  Social History Narrative  . Not on file   Social Determinants of Health   Financial Resource Strain: Not on file  Food Insecurity: Not on file  Transportation Needs: Not on file  Physical Activity: Not on file  Stress: Not on file  Social Connections: Not on file  Intimate Partner Violence: Not on file    No Known Allergies  Current Outpatient Medications  Medication Sig Dispense Refill  . allopurinol (ZYLOPRIM) 100  MG tablet Take 100 mg by mouth daily.    Marland Kitchen amLODipine (NORVASC) 10 MG tablet Take 10 mg by mouth daily.    Marland Kitchen atorvastatin (LIPITOR) 40 MG tablet Take 40 mg by mouth daily.    . carvedilol (COREG) 25 MG tablet Take 25 mg by mouth 2 (two) times daily.    . clopidogrel (PLAVIX) 75 MG tablet Take 75 mg by mouth daily.    . ferrous sulfate 325 (65 FE) MG tablet Take 325 mg by mouth daily.    Tyler Aas FLEXTOUCH 200 UNIT/ML FlexTouch Pen Inject 18 Units into the skin daily.    . valsartan (DIOVAN) 320 MG tablet Take 320 mg by mouth daily.     No current facility-administered medications for this visit.    REVIEW OF SYSTEMS:   [X]  denotes positive finding, [ ]  denotes negative finding Cardiac  Comments:  Chest pain or chest pressure: x   Shortness of breath upon exertion: x   Short of breath when lying flat: x   Irregular heart rhythm: x       Vascular    Pain in calf, thigh, or hip brought on by ambulation:    Pain in feet at night that wakes you up from your sleep:     Blood clot in your veins:    Leg swelling:         Pulmonary    Oxygen at home: x   Productive cough:  x   Wheezing:  x       Neurologic    Sudden weakness in arms or legs:  x   Sudden numbness in arms or legs:  x   Sudden onset of difficulty speaking or slurred speech: x   Temporary loss of vision in one eye:  x   Problems with dizziness:  x       Gastrointestinal    Blood in stool:  x   Vomited blood:  x       Genitourinary    Burning when urinating:  x   Blood in urine: x       Psychiatric    Major depression:  x       Hematologic    Bleeding problems: x   Problems with blood clotting too easily: x       Skin    Rashes or ulcers: x       Constitutional    Fever or chills: x    PHYSICAL EXAM:   Vitals:   02/05/20 0835  BP: 131/68  Pulse: 93  Resp: 20  Temp: 98.1 F (36.7 C)  SpO2: 99%  Weight: 172 lb (78 kg)  Height: 5\' 4"  (1.626 m)   Constitutional: well appearing in no distress. Appears well nourished.  Neurologic: CN intact. No focal findings. No sensory loss. Psychiatric: Mood and affect symmetric and appropriate. Eyes: No icterus. No conjunctival pallor. Ears, nose, throat: mucous membranes moist. Midline trachea.  Cardiac: Regular rate and rhythm.  Respiratory: unlabored. Abdominal: soft, non-tender, non-distended.  Peripheral vascular:   palpable left brachial and radial pulse.   No scars across the left arm  No catheters, pacemakers or scars across the chest Extremity: No edema. No cyanosis. No pallor.  Skin: No gangrene. No ulceration.  Lymphatic: No Stemmer's sign. No palpable  lymphadenopathy.   DATA REVIEW:    Most recent CBC CBC Latest Ref Rng & Units 02/04/2020 01/08/2020 04/15/2008  WBC 4.0 - 10.5 K/uL 6.7 5.5 4.0  Hemoglobin 13.0 - 17.0  g/dL 9.9(L) 10.3(L) 12.7(L)  Hematocrit 39.0 - 52.0 % 32.3(L) 34.3(L) 38.2(L)  Platelets 150 - 400 K/uL 224 241 204     Most recent CMP CMP Latest Ref Rng & Units 02/04/2020 04/15/2008  Glucose 70 - 99 mg/dL 165(H) 242(H)  BUN 8 - 23 mg/dL 35(H) 17  Creatinine 0.61 - 1.24 mg/dL 2.14(H) 1.49  Sodium 135 - 145 mmol/L 140 137  Potassium 3.5 - 5.1 mmol/L 4.7 3.9  Chloride 98 - 111 mmol/L 107 104  CO2 22 - 32 mmol/L 22 27  Calcium 8.9 - 10.3 mg/dL 9.2 9.7  Total Protein 6.0 - 8.3 g/dL - 6.7  Total Bilirubin 0.3 - 1.2 mg/dL - 1.0  Alkaline Phos 39 - 117 U/L - 76  AST 0 - 37 U/L - 20  ALT 0 - 53 U/L - 17    Renal function Estimated Creatinine Clearance: 26 mL/min (A) (by C-G formula based on SCr of 2.14 mg/dL (H)).  Hgb A1c MFr Bld (%)  Date Value  02/04/2020 7.9 (H)    No results found for: LDLCALC, LDLC, HIRISKLDL, POCLDL, LDLDIRECT, REALLDLC, TOTLDLC   Vascular Imaging: Arterial and venous duplex 02/05/20 Personally reviewed Adequate arterial inflow for AVF Left basilic is adequate for AVF  [employer]. Stanford Breed, MD [facility] Phone Number: [phone] 02/05/2020 8:49 AM

## 2020-02-06 ENCOUNTER — Encounter (HOSPITAL_COMMUNITY): Payer: Self-pay

## 2020-02-06 ENCOUNTER — Other Ambulatory Visit (HOSPITAL_COMMUNITY)
Admission: RE | Admit: 2020-02-06 | Discharge: 2020-02-06 | Disposition: A | Discharge status: Home / Self Care | Payer: Medicare Other | Source: Ambulatory Visit | Attending: Vascular Surgery | Admitting: Vascular Surgery

## 2020-02-06 DIAGNOSIS — Z01812 Encounter for preprocedural laboratory examination: Secondary | ICD-10-CM | POA: Insufficient documentation

## 2020-02-06 DIAGNOSIS — Z20822 Contact with and (suspected) exposure to covid-19: Secondary | ICD-10-CM | POA: Diagnosis not present

## 2020-02-06 DIAGNOSIS — J984 Other disorders of lung: Secondary | ICD-10-CM | POA: Diagnosis not present

## 2020-02-06 DIAGNOSIS — C641 Malignant neoplasm of right kidney, except renal pelvis: Secondary | ICD-10-CM | POA: Diagnosis not present

## 2020-02-06 DIAGNOSIS — I2510 Atherosclerotic heart disease of native coronary artery without angina pectoris: Secondary | ICD-10-CM | POA: Diagnosis not present

## 2020-02-06 DIAGNOSIS — I700 Atherosclerosis of aorta: Secondary | ICD-10-CM | POA: Diagnosis not present

## 2020-02-06 LAB — SARS CORONAVIRUS 2 (TAT 6-24 HRS): SARS Coronavirus 2: NEGATIVE

## 2020-02-06 NOTE — Anesthesia Preprocedure Evaluation (Addendum)
Anesthesia Evaluation  Patient identified by MRN, date of birth, ID band Patient awake    Reviewed: Allergy & Precautions, NPO status , Patient's Chart, lab work & pertinent test results  Airway Mallampati: II  TM Distance: >3 FB Neck ROM: Full    Dental  (+) Upper Dentures, Lower Dentures   Pulmonary neg pulmonary ROS, former smoker,    Pulmonary exam normal        Cardiovascular hypertension, Pt. on medications and Pt. on home beta blockers  Rhythm:Regular Rate:Normal     Neuro/Psych CVA negative psych ROS   GI/Hepatic negative GI ROS, Neg liver ROS,   Endo/Other  diabetes, Type 2  Renal/GU ESRFRenal diseaseRCC (right)   BPH    Musculoskeletal  (+) Arthritis , Osteoarthritis,    Abdominal (+)  Abdomen: soft. Bowel sounds: normal.  Peds  Hematology  (+) anemia ,   Anesthesia Other Findings   Reproductive/Obstetrics                            Anesthesia Physical Anesthesia Plan  ASA: III  Anesthesia Plan: MAC and Regional   Post-op Pain Management:    Induction: Intravenous  PONV Risk Score and Plan: 0 and Ondansetron, Propofol infusion and Treatment may vary due to age or medical condition  Airway Management Planned: Simple Face Mask, Natural Airway and Nasal Cannula  Additional Equipment: None  Intra-op Plan:   Post-operative Plan:   Informed Consent: I have reviewed the patients History and Physical, chart, labs and discussed the procedure including the risks, benefits and alternatives for the proposed anesthesia with the patient or authorized representative who has indicated his/her understanding and acceptance.     Dental advisory given  Plan Discussed with: CRNA  Anesthesia Plan Comments: (Lab Results      Component                Value               Date                      WBC                      6.7                 02/04/2020                HGB                       9.9 (L)             02/07/2020                HCT                      29.0 (L)            02/07/2020                MCV                      100.0               02/04/2020                PLT  224                 02/04/2020           Lab Results      Component                Value               Date                      NA                       141                 02/07/2020                K                        4.4                 02/07/2020                CO2                      22                  02/04/2020                GLUCOSE                  177 (H)             02/07/2020                BUN                      35 (H)              02/07/2020                CREATININE               2.30 (H)            02/07/2020                CALCIUM                  9.2                 02/04/2020                GFRNONAA                 31 (L)              02/04/2020                GFRAA                                        04/15/2008            57        The eGFR has been calculated using the MDRD equation. This calculation has not been validated in all clinical situations. eGFR's persistently <60 mL/min signify possible Chronic Kidney Disease. (L))       Anesthesia Quick Evaluation

## 2020-02-06 NOTE — Progress Notes (Signed)
EKG:  01/10/20 CXR:  04/01/08 ECHO:  Denies Stress Test:  Denies Cardiac Cath:Denies  Fasting Blood Sugar- Checks Blood Sugar___ times a day  OSA/CPAP:  No  ASA:  MD told patient to take ASA 81 mg Blood Thinner:  Last dose Plavis 02/03/20  Covid test 02/06/20  Anesthesia Review:  No  Patient denies shortness of breath, fever, cough, and chest pain at PAT appointment.  Patient verbalized understanding of instructions provided today at the PAT appointment.  Patient asked to review instructions at home and day of surgery.

## 2020-02-07 ENCOUNTER — Encounter (HOSPITAL_COMMUNITY)
Admission: RE | Disposition: A | Discharge status: Home / Self Care | Payer: Self-pay | Source: Home / Self Care | Attending: Vascular Surgery

## 2020-02-07 ENCOUNTER — Other Ambulatory Visit: Payer: Self-pay

## 2020-02-07 ENCOUNTER — Ambulatory Visit (HOSPITAL_COMMUNITY): Payer: Medicare Other | Admitting: Anesthesiology

## 2020-02-07 ENCOUNTER — Ambulatory Visit (HOSPITAL_COMMUNITY)
Admission: RE | Admit: 2020-02-07 | Discharge: 2020-02-07 | Disposition: A | Discharge status: Home / Self Care | Payer: Medicare Other | Attending: Vascular Surgery | Admitting: Vascular Surgery

## 2020-02-07 ENCOUNTER — Other Ambulatory Visit (HOSPITAL_COMMUNITY): Payer: Medicare Other

## 2020-02-07 ENCOUNTER — Encounter (HOSPITAL_COMMUNITY): Payer: Self-pay

## 2020-02-07 DIAGNOSIS — Z7902 Long term (current) use of antithrombotics/antiplatelets: Secondary | ICD-10-CM | POA: Insufficient documentation

## 2020-02-07 DIAGNOSIS — Z87891 Personal history of nicotine dependence: Secondary | ICD-10-CM | POA: Insufficient documentation

## 2020-02-07 DIAGNOSIS — Z794 Long term (current) use of insulin: Secondary | ICD-10-CM | POA: Diagnosis not present

## 2020-02-07 DIAGNOSIS — N186 End stage renal disease: Secondary | ICD-10-CM | POA: Diagnosis not present

## 2020-02-07 DIAGNOSIS — I120 Hypertensive chronic kidney disease with stage 5 chronic kidney disease or end stage renal disease: Secondary | ICD-10-CM | POA: Diagnosis not present

## 2020-02-07 DIAGNOSIS — E1122 Type 2 diabetes mellitus with diabetic chronic kidney disease: Secondary | ICD-10-CM | POA: Diagnosis not present

## 2020-02-07 DIAGNOSIS — C641 Malignant neoplasm of right kidney, except renal pelvis: Secondary | ICD-10-CM | POA: Diagnosis not present

## 2020-02-07 DIAGNOSIS — N184 Chronic kidney disease, stage 4 (severe): Secondary | ICD-10-CM | POA: Insufficient documentation

## 2020-02-07 DIAGNOSIS — D631 Anemia in chronic kidney disease: Secondary | ICD-10-CM | POA: Diagnosis not present

## 2020-02-07 DIAGNOSIS — I129 Hypertensive chronic kidney disease with stage 1 through stage 4 chronic kidney disease, or unspecified chronic kidney disease: Secondary | ICD-10-CM | POA: Insufficient documentation

## 2020-02-07 DIAGNOSIS — Z79899 Other long term (current) drug therapy: Secondary | ICD-10-CM | POA: Diagnosis not present

## 2020-02-07 HISTORY — DX: Anemia, unspecified: D64.9

## 2020-02-07 HISTORY — PX: AV FISTULA PLACEMENT: SHX1204

## 2020-02-07 HISTORY — DX: Hyperlipidemia, unspecified: E78.5

## 2020-02-07 LAB — POCT I-STAT, CHEM 8
BUN: 35 mg/dL — ABNORMAL HIGH (ref 8–23)
Calcium, Ion: 1.24 mmol/L (ref 1.15–1.40)
Chloride: 108 mmol/L (ref 98–111)
Creatinine, Ser: 2.3 mg/dL — ABNORMAL HIGH (ref 0.61–1.24)
Glucose, Bld: 177 mg/dL — ABNORMAL HIGH (ref 70–99)
HCT: 29 % — ABNORMAL LOW (ref 39.0–52.0)
Hemoglobin: 9.9 g/dL — ABNORMAL LOW (ref 13.0–17.0)
Potassium: 4.4 mmol/L (ref 3.5–5.1)
Sodium: 141 mmol/L (ref 135–145)
TCO2: 22 mmol/L (ref 22–32)

## 2020-02-07 LAB — GLUCOSE, CAPILLARY
Glucose-Capillary: 169 mg/dL — ABNORMAL HIGH (ref 70–99)
Glucose-Capillary: 175 mg/dL — ABNORMAL HIGH (ref 70–99)

## 2020-02-07 SURGERY — ARTERIOVENOUS (AV) FISTULA CREATION
Anesthesia: Monitor Anesthesia Care | Site: Arm Upper | Laterality: Left

## 2020-02-07 MED ORDER — FENTANYL CITRATE (PF) 250 MCG/5ML IJ SOLN
INTRAMUSCULAR | Status: AC
  Filled 2020-02-07: qty 5

## 2020-02-07 MED ORDER — DEXMEDETOMIDINE (PRECEDEX) IN NS 20 MCG/5ML (4 MCG/ML) IV SYRINGE
PREFILLED_SYRINGE | INTRAVENOUS | Status: DC | PRN
  Administered 2020-02-07 (×2): 4 ug via INTRAVENOUS

## 2020-02-07 MED ORDER — FENTANYL CITRATE (PF) 100 MCG/2ML IJ SOLN
INTRAMUSCULAR | Status: DC | PRN
  Administered 2020-02-07 (×2): 25 ug via INTRAVENOUS

## 2020-02-07 MED ORDER — SODIUM CHLORIDE 0.9 % IV SOLN
INTRAVENOUS | Status: DC | PRN
  Administered 2020-02-07: 07:00:00 500 mL

## 2020-02-07 MED ORDER — 0.9 % SODIUM CHLORIDE (POUR BTL) OPTIME
TOPICAL | Status: DC | PRN
  Administered 2020-02-07: 1000 mL

## 2020-02-07 MED ORDER — LIDOCAINE HCL (PF) 2 % IJ SOLN
INTRAMUSCULAR | Status: DC | PRN
Start: 2020-02-07 — End: 2020-02-07
  Administered 2020-02-07: 400 mg via PERINEURAL

## 2020-02-07 MED ORDER — HEPARIN SODIUM (PORCINE) 1000 UNIT/ML IJ SOLN
INTRAMUSCULAR | Status: AC
  Filled 2020-02-07: qty 1

## 2020-02-07 MED ORDER — ONDANSETRON HCL 4 MG/2ML IJ SOLN
INTRAMUSCULAR | Status: AC
  Filled 2020-02-07: qty 2

## 2020-02-07 MED ORDER — SODIUM CHLORIDE 0.9 % IV SOLN: INTRAVENOUS | Status: DC

## 2020-02-07 MED ORDER — EPHEDRINE SULFATE-NACL 50-0.9 MG/10ML-% IV SOSY
PREFILLED_SYRINGE | INTRAVENOUS | Status: DC | PRN
  Administered 2020-02-07 (×2): 5 mg via INTRAVENOUS

## 2020-02-07 MED ORDER — CHLORHEXIDINE GLUCONATE 4 % EX LIQD: 60.0000 mL | Freq: Once | CUTANEOUS | Status: DC

## 2020-02-07 MED ORDER — PROPOFOL 10 MG/ML IV BOLUS
INTRAVENOUS | Status: AC
  Filled 2020-02-07: qty 20

## 2020-02-07 MED ORDER — CHLORHEXIDINE GLUCONATE 0.12 % MT SOLN
15.0000 mL | Freq: Once | OROMUCOSAL | Status: AC
  Administered 2020-02-07: 15 mL via OROMUCOSAL
  Filled 2020-02-07 (×2): qty 15

## 2020-02-07 MED ORDER — ONDANSETRON HCL 4 MG/2ML IJ SOLN
INTRAMUSCULAR | Status: DC | PRN
  Administered 2020-02-07: 4 mg via INTRAVENOUS

## 2020-02-07 MED ORDER — LIDOCAINE HCL (PF) 1 % IJ SOLN
INTRAMUSCULAR | Status: AC
  Filled 2020-02-07: qty 30

## 2020-02-07 MED ORDER — PROPOFOL 500 MG/50ML IV EMUL
INTRAVENOUS | Status: DC | PRN
  Administered 2020-02-07: 100 ug/kg/min via INTRAVENOUS

## 2020-02-07 MED ORDER — HYDROCODONE-ACETAMINOPHEN 5-325 MG PO TABS: 1.0000 | ORAL_TABLET | Freq: Four times a day (QID) | ORAL | 0 refills | Status: DC | PRN

## 2020-02-07 MED ORDER — DEXAMETHASONE SODIUM PHOSPHATE 10 MG/ML IJ SOLN
INTRAMUSCULAR | Status: AC
  Filled 2020-02-07: qty 1

## 2020-02-07 MED ORDER — PROPOFOL 10 MG/ML IV BOLUS
INTRAVENOUS | Status: DC | PRN
  Administered 2020-02-07: 20 mg via INTRAVENOUS

## 2020-02-07 MED ORDER — LIDOCAINE 2% (20 MG/ML) 5 ML SYRINGE
INTRAMUSCULAR | Status: DC | PRN
  Administered 2020-02-07: 20 mg via INTRAVENOUS

## 2020-02-07 MED ORDER — HEPARIN SODIUM (PORCINE) 1000 UNIT/ML IJ SOLN
INTRAMUSCULAR | Status: DC | PRN
  Administered 2020-02-07: 5000 [IU] via INTRAVENOUS

## 2020-02-07 MED ORDER — PHENYLEPHRINE HCL-NACL 10-0.9 MG/250ML-% IV SOLN
INTRAVENOUS | Status: DC | PRN
  Administered 2020-02-07: 40 ug/min via INTRAVENOUS

## 2020-02-07 MED ORDER — SODIUM CHLORIDE 0.9 % IV SOLN
INTRAVENOUS | Status: AC
  Filled 2020-02-07: qty 1.2

## 2020-02-07 MED ORDER — LIDOCAINE 2% (20 MG/ML) 5 ML SYRINGE
INTRAMUSCULAR | Status: AC
  Filled 2020-02-07: qty 5

## 2020-02-07 MED ORDER — CEFAZOLIN SODIUM-DEXTROSE 2-4 GM/100ML-% IV SOLN
2.0000 g | INTRAVENOUS | Status: AC
  Administered 2020-02-07: 2 g via INTRAVENOUS
  Filled 2020-02-07: qty 100

## 2020-02-07 MED ORDER — DEXMEDETOMIDINE (PRECEDEX) IN NS 20 MCG/5ML (4 MCG/ML) IV SYRINGE
PREFILLED_SYRINGE | INTRAVENOUS | Status: AC
  Filled 2020-02-07: qty 5

## 2020-02-07 MED ORDER — LIDOCAINE-EPINEPHRINE (PF) 1 %-1:200000 IJ SOLN
INTRAMUSCULAR | Status: AC
  Filled 2020-02-07: qty 30

## 2020-02-07 MED ORDER — EPHEDRINE 5 MG/ML INJ
INTRAVENOUS | Status: AC
  Filled 2020-02-07: qty 10

## 2020-02-07 SURGICAL SUPPLY — 36 items
ARMBAND PINK RESTRICT EXTREMIT (MISCELLANEOUS) ×2 IMPLANT
BENZOIN TINCTURE PRP APPL 2/3 (GAUZE/BANDAGES/DRESSINGS) ×2 IMPLANT
CANISTER SUCT 3000ML PPV (MISCELLANEOUS) ×2 IMPLANT
CANNULA VESSEL 3MM 2 BLNT TIP (CANNULA) ×2 IMPLANT
CHLORAPREP W/TINT 26 (MISCELLANEOUS) ×2 IMPLANT
CLIP VESOCCLUDE MED 6/CT (CLIP) ×2 IMPLANT
CLIP VESOCCLUDE SM WIDE 6/CT (CLIP) ×2 IMPLANT
COVER PROBE W GEL 5X96 (DRAPES) IMPLANT
COVER WAND RF STERILE (DRAPES) ×2 IMPLANT
DERMABOND ADVANCED (GAUZE/BANDAGES/DRESSINGS) ×1
DERMABOND ADVANCED .7 DNX12 (GAUZE/BANDAGES/DRESSINGS) ×1 IMPLANT
DRAPE EXTREMITY T 121X128X90 (DISPOSABLE) ×2 IMPLANT
ELECT REM PT RETURN 9FT ADLT (ELECTROSURGICAL) ×2
ELECTRODE REM PT RTRN 9FT ADLT (ELECTROSURGICAL) ×1 IMPLANT
GLOVE BIO SURGEON STRL SZ7.5 (GLOVE) ×2 IMPLANT
GLOVE ECLIPSE 8.5 STRL (GLOVE) ×2 IMPLANT
GLOVE SURG SS PI 8.0 STRL IVOR (GLOVE) ×2 IMPLANT
GOWN STRL REUS W/ TWL LRG LVL3 (GOWN DISPOSABLE) ×1 IMPLANT
GOWN STRL REUS W/ TWL XL LVL3 (GOWN DISPOSABLE) ×2 IMPLANT
GOWN STRL REUS W/TWL LRG LVL3 (GOWN DISPOSABLE) ×1
GOWN STRL REUS W/TWL XL LVL3 (GOWN DISPOSABLE) ×2
INSERT FOGARTY SM (MISCELLANEOUS) IMPLANT
KIT BASIN OR (CUSTOM PROCEDURE TRAY) ×2 IMPLANT
KIT TURNOVER KIT B (KITS) ×2 IMPLANT
NS IRRIG 1000ML POUR BTL (IV SOLUTION) ×2 IMPLANT
PACK CV ACCESS (CUSTOM PROCEDURE TRAY) ×2 IMPLANT
PAD ARMBOARD 7.5X6 YLW CONV (MISCELLANEOUS) ×4 IMPLANT
PENCIL SMOKE EVACUATOR (MISCELLANEOUS) ×2 IMPLANT
STRIP CLOSURE SKIN 1/2X4 (GAUZE/BANDAGES/DRESSINGS) ×2 IMPLANT
SUT MNCRL AB 4-0 PS2 18 (SUTURE) ×2 IMPLANT
SUT PROLENE 6 0 BV (SUTURE) ×4 IMPLANT
SUT VIC AB 3-0 SH 27 (SUTURE) ×1
SUT VIC AB 3-0 SH 27X BRD (SUTURE) ×1 IMPLANT
TOWEL GREEN STERILE (TOWEL DISPOSABLE) ×2 IMPLANT
UNDERPAD 30X36 HEAVY ABSORB (UNDERPADS AND DIAPERS) ×2 IMPLANT
WATER STERILE IRR 1000ML POUR (IV SOLUTION) ×2 IMPLANT

## 2020-02-07 NOTE — Transfer of Care (Signed)
Immediate Anesthesia Transfer of Care Note  Patient: Jason Nielsen  Procedure(s) Performed: LEFT UPPER EXTREMITY FIRST STAGE BRACHIO-BASILIC ARTERIOVENOUS (AV) FISTULA CREATION (Left Arm Upper)  Patient Location: PACU  Anesthesia Type:MAC and Regional  Level of Consciousness: drowsy  Airway & Oxygen Therapy: Patient Spontanous Breathing and Patient connected to face mask oxygen  Post-op Assessment: Report given to RN and Post -op Vital signs reviewed and stable  Post vital signs: Reviewed and stable  Last Vitals:  Vitals Value Taken Time  BP 109/64 02/07/20 0900  Temp 36.1 C 02/07/20 0900  Pulse 47 02/07/20 0903  Resp 12 02/07/20 0903  SpO2 98 % 02/07/20 0903  Vitals shown include unvalidated device data.  Last Pain:  Vitals:   02/07/20 0624  TempSrc: Temporal  PainSc:       Patients Stated Pain Goal: 3 (21/03/12 8118)  Complications: No complications documented.

## 2020-02-07 NOTE — Anesthesia Procedure Notes (Signed)
Procedure Name: MAC Date/Time: 02/07/2020 7:40 AM Performed by: Reece Agar, CRNA Pre-anesthesia Checklist: Patient identified, Emergency Drugs available, Suction available and Patient being monitored Patient Re-evaluated:Patient Re-evaluated prior to induction Oxygen Delivery Method: Simple face mask

## 2020-02-07 NOTE — Anesthesia Procedure Notes (Signed)
Anesthesia Regional Block: Supraclavicular block   Pre-Anesthetic Checklist: ,, timeout performed, Correct Patient, Correct Site, Correct Laterality, Correct Procedure, Correct Position, site marked, Risks and benefits discussed,  Surgical consent,  Pre-op evaluation,  At surgeon's request and post-op pain management  Laterality: Left  Prep: Dura Prep       Needles:  Injection technique: Single-shot  Needle Type: Echogenic Stimulator Needle     Needle Length: 5cm  Needle Gauge: 20     Additional Needles:   Procedures:,,,, ultrasound used (permanent image in chart),,,,  Narrative:  Start time: 02/07/2020 7:00 AM End time: 02/07/2020 7:05 AM Injection made incrementally with aspirations every 5 mL.  Performed by: Personally  Anesthesiologist: Darral Dash, DO  Additional Notes: Patient identified. Risks/Benefits/Options discussed with patient including but not limited to bleeding, infection, nerve damage, failed block, incomplete pain control. Patient expressed understanding and wished to proceed. All questions were answered. Sterile technique was used throughout the entire procedure. Please see nursing notes for vital signs. Aspirated in 5cc intervals with injection for negative confirmation. Patient was given instructions on fall risk and not to get out of bed. All questions and concerns addressed with instructions to call with any issues or inadequate analgesia.

## 2020-02-07 NOTE — Op Note (Signed)
DATE OF SERVICE: 02/07/2020  PATIENT:  Jason Nielsen  80 y.o. male  PRE-OPERATIVE DIAGNOSIS:  CKD IV, RCC in need of nephrectomy  POST-OPERATIVE DIAGNOSIS:  Same  PROCEDURE:   1) left upper extremity brachiobasilic AVF  SURGEON:  Surgeon(s) and Role:    * Cherre Robins, MD - Primary  ASSISTANT: Arlee Muslim, PA-C  An assistant was required to facilitate exposure and expedite the case.  ANESTHESIA:   regional and MAC  EBL: min  BLOOD ADMINISTERED:none  DRAINS: none   LOCAL MEDICATIONS USED:  NONE  SPECIMEN:  none  COUNTS: confirmed correct.  TOURNIQUET:  None  PATIENT DISPOSITION:  PACU - hemodynamically stable.   Delay start of Pharmacological VTE agent (>24hrs) due to surgical blood loss or risk of bleeding: no  INDICATION FOR PROCEDURE: Koron Godeaux is a 80 y.o. male with CKDIV. He has unfortunately developed renal cell carcinoma which needs nephrectomy. He is likely to need HD access in the near future. After careful discussion of risks, benefits, and alternatives the patient was offered AV fistula creation. We specifically discussed risk of steal syndrome. The patient understood and wished to proceed.  OPERATIVE FINDINGS: adequate vein and artery for AVF  DESCRIPTION OF PROCEDURE: After identification of the patient in the pre-operative holding area, the patient was transferred to the operating room. The patient was positioned supine on the operating room table. Anesthesia was induced. The left arm was prepped and draped in standard fashion. A surgical pause was performed confirming correct patient, procedure, and operative location.  Using intraoperative ultrasound the left brachial artery and basilic vein were mapped.  A curvilinear incision was planned over the course of the two vessels to allow fistula creation.  Incision was created.  Incision was carried down through subcutaneous tissue.  The aponeurosis of the biceps tendon was divided.  The brachial sheath  was identified.  The brachial artery was skeletonized.  The artery was encircled with 2 Silastic Vesseloops.  Next attention was turned to the basilic vein.  This was identified in the medial arm in its typical position.  The vein was mobilized throughout the length of the incision to allow tension-free arteriovenous fistula creation.  The distal end of the vein was clamped with a right angle.  The proximal end of the vein was clamped with a bulldog.  The vein was transected distally.  The stump was oversewn with a 2-0 silk.  The cut end of the vein was spatulated and distended with a mosquito clamp.  Patient was systemically heparinized with 3000 units of IV heparin.  After a three minute pause, the brachial artery was clamped proximally distally.  The basilic vein was anastomosed to the brachial artery into side using continuous running suture of 6-0 Prolene.  Immediately prior to completion the anastomosis was flushed and de-aired.  The anastomosis was completed.  Clamps were released.  Hemostasis was achieved.  An audible bruit was heard in the fistula.  Palpable pulse was not felt in the left wrist. A biphasic radial doppler signal was noted.  This augmented significantly with compression of the AVF. Hemostasis was achieved in the surgical bed.  The wound was closed with 3-0 Vicryl and 4-0 Monocryl.  Upon completion of the case instrument and sharps counts were confirmed correct. The patient was transferred to the PACU in good condition. I was present for all portions of the procedure.  Yevonne Aline. Stanford Breed, MD [facility] Phone Number: [phone] 02/07/2020 9:20 AM

## 2020-02-07 NOTE — Anesthesia Postprocedure Evaluation (Signed)
Anesthesia Post Note  Patient: Jason Nielsen  Procedure(s) Performed: LEFT UPPER EXTREMITY FIRST STAGE BRACHIO-BASILIC ARTERIOVENOUS (AV) FISTULA CREATION (Left Arm Upper)     Patient location during evaluation: PACU Anesthesia Type: Regional and MAC Level of consciousness: awake and alert Pain management: pain level controlled Vital Signs Assessment: post-procedure vital signs reviewed and stable Respiratory status: spontaneous breathing, nonlabored ventilation, respiratory function stable and patient connected to nasal cannula oxygen Cardiovascular status: stable and blood pressure returned to baseline Postop Assessment: no apparent nausea or vomiting Anesthetic complications: no   No complications documented.  Last Vitals:  Vitals:   02/07/20 0930 02/07/20 0947  BP: 128/74 (!) 141/64  Pulse: (!) 57 (!) 53  Resp: 14 17  Temp:  (!) 36.2 C  SpO2: 95% 97%    Last Pain:  Vitals:   02/07/20 0930  TempSrc:   PainSc: 0-No pain                 Belenda Cruise P Stoltzfus

## 2020-02-07 NOTE — Discharge Instructions (Signed)
° °  [facility] [place] ° °Discharge Instructions ° °AV Fistula or Graft Surgery for Dialysis Access ° °Please refer to the following instructions for your post-procedure care. Your surgeon or physician assistant will discuss any changes with you. ° °Activity ° °You may drive the day following your surgery, if you are comfortable and no longer taking prescription pain medication. Resume full activity as the soreness in your incision resolves. ° °Bathing/Showering ° °You may shower after you go home. Keep your incision dry for 48 hours. Do not soak in a bathtub, hot tub, or swim until the incision heals completely. You may not shower if you have a hemodialysis catheter. ° °Incision Care ° °Clean your incision with mild soap and water after 48 hours. Pat the area dry with a clean towel. You do not need a bandage unless otherwise instructed. Do not apply any ointments or creams to your incision. You may have skin glue on your incision. Do not peel it off. It will come off on its own in about one week. Your arm may swell a bit after surgery. To reduce swelling use pillows to elevate your arm so it is above your heart. Your doctor will tell you if you need to lightly wrap your arm with an ACE bandage. ° °Diet ° °Resume your normal diet. There are not special food restrictions following this procedure. In order to heal from your surgery, it is CRITICAL to get adequate nutrition. Your body requires vitamins, minerals, and protein. Vegetables are the best source of vitamins and minerals. Vegetables also provide the perfect balance of protein. Processed food has little nutritional value, so try to avoid this. ° °Medications ° °Resume taking all of your medications. If your incision is causing pain, you may take over-the counter pain relievers such as acetaminophen (Tylenol). If you were prescribed a stronger pain medication, please be aware these medications can cause nausea and constipation. Prevent  nausea by taking the medication with a snack or meal. Avoid constipation by drinking plenty of fluids and eating foods with high amount of fiber, such as fruits, vegetables, and grains. Do not take Tylenol if you are taking prescription pain medications. ° ° ° ° °Follow up °Your surgeon may want to see you in the office following your access surgery. If so, this will be arranged at the time of your surgery. ° °Please call us immediately for any of the following conditions: ° °Increased pain, redness, drainage (pus) from your incision site °Fever of 101 degrees or higher °Severe or worsening pain at your incision site °Hand pain or numbness. ° °Reduce your risk of vascular disease: ° °Stop smoking. If you would like help, call [facility] at 1-800-QUIT-NOW (1-800-784-8669) or [place] at 336-586-4000 ° °Manage your cholesterol °Maintain a desired weight °Control your diabetes °Keep your blood pressure down ° °Dialysis ° °It will take several weeks to several months for your new dialysis access to be ready for use. Your surgeon will determine when it is OK to use it. Your nephrologist will continue to direct your dialysis. You can continue to use your Permcath until your new access is ready for use. ° °If you have any questions, please call the office at [phone]. ° °

## 2020-02-07 NOTE — Interval H&P Note (Signed)
History and Physical Interval Note:  02/07/2020 7:17 AM  Jason Nielsen  has presented today for surgery, with the diagnosis of CHRONIC KIDNEY DISEASE STAGE V.  The various methods of treatment have been discussed with the patient and family. After consideration of risks, benefits and other options for treatment, the patient has consented to  Procedure(s) with comments: LEFT UPPER EXTREMITY ARTERIOVENOUS (AV) FISTULA CREATION (Left) - PERIPHERAL NERVE BLOCK as a surgical intervention.  The patient's history has been reviewed, patient examined, no change in status, stable for surgery.  I have reviewed the patient's chart and labs.  Questions were answered to the patient's satisfaction.     Cherre Robins

## 2020-02-08 ENCOUNTER — Encounter (HOSPITAL_COMMUNITY): Payer: Self-pay | Admitting: Vascular Surgery

## 2020-02-08 ENCOUNTER — Other Ambulatory Visit (HOSPITAL_COMMUNITY)
Admission: RE | Admit: 2020-02-08 | Discharge: 2020-02-08 | Disposition: A | Discharge status: Home / Self Care | Payer: Medicare Other | Source: Ambulatory Visit | Attending: Urology | Admitting: Urology

## 2020-02-08 DIAGNOSIS — Z01812 Encounter for preprocedural laboratory examination: Secondary | ICD-10-CM | POA: Insufficient documentation

## 2020-02-08 DIAGNOSIS — Z20822 Contact with and (suspected) exposure to covid-19: Secondary | ICD-10-CM | POA: Insufficient documentation

## 2020-02-08 NOTE — H&P (Signed)
Office Visit Report     01/15/2020   --------------------------------------------------------------------------------   Jason Nielsen  MRN: [id number]  DOB: [birth year], 80 year old Male  SSN: -[id number]   PRIMARY CARE:  Patrina Levering, MD  REFERRING:  Raynelle Bring, Eduardo Osier  PROVIDER:  Raynelle Bring, M.D.  LOCATION:  [facility] [phone]     --------------------------------------------------------------------------------   CC/HPI: CC: Right renal cell carcinoma   Jason Nielsen is an 80 year old gentleman who underwent brachytherapy for treatment of prostate cancer in April 2010. He was last seen in May 2018 when he was noted to be disease free. He reportedly recently developed CKD (Cr 2.9, eGFR 21 ml/min, improved Cr to 2.0 on follow up) with proteinuria prompting a nephrology referral and a renal ultrasound which showed a 6.5 cm solid appearing right renal mass on 10/15/19. An MRI of the abdomen with and without contrast was then performed on 11/22/19 and this confirmed an enhancing 5.9 cm right lateral interpolar renal neoplasm consistent with renal cell carcinoma. Additional findings included an apparent 8 mm filling defect in the IVC near the renal vein without evidence of tumor thrombus suggesting possible bland thrombus. He returns today after undergoing multiple studies for further evaluation after presenting in December for an initial evaluation for his right renal mass.   Since his last visit, he has undergone further evaluation with multiple tests. He did have lower extremity Doppler ultrasound studies on 01/01/2020. These were negative for acute DVT. His laboratory studies were also performed indicating a serum creatinine of 2.0, which was slightly better than his previous baseline of approximately 2.9. He was seen by Dr. Candiss Norse, who discussed the possibility of necessitating dialysis postoperatively. He is currently scheduled to see vascular surgery to consider in  a-V fistula. He is prepared to accept this potential risk of undergoing nephrectomy. He also underwent a percutaneous biopsy which indeed confirmed grade 3, clear cell renal cell carcinoma. Finally, he also had a preoperative risk assessment by cardiology. His 30 day mortality risk based on his noncardiac medical comorbidities is estimated to be approximately 15%. He does not require further cardiac testing prior to surgery. He has not yet proceeded with his CT of the chest to complete his metastatic evaluation.   Finally, I did present his case in the GU tumor board conference. 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 It was not recommended that he be anticoagulated or proceed with any additional treatment for this. In the setting of no acute DVT, this felt that this could be safely observed.   Family history of kidney cancer: None  Family history of ESRD: No   Imaging: MRI (11/22/19)  Side of renal neoplasm: Right  Size of renal neoplasm: 5.9 cm  Location of renal neoplasm: Lateral interpolar, central  Exophytic or endophytic: Endophytic  Renal nephrometry score: 10x   Renal artery anatomy: Single renal artery  Renal vein anatomy: Single renal vein   Contralateral renal lesions:  Regional lymphadenopathy: None.  Adrenal masses: None.  Renal vein/IVC involvement: No. 8 mm filling defect in IVC.  Metastatic disease to the abdomen: No.   Chest imaging: Still PENDING.  LFTs: Normal.   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  PSH: No abdominal surgeries.     ALLERGIES: No Allergies  MEDICATIONS: Allopurinol 100 mg tablet  Amlodipine Besylate 10 mg tablet  Atorvastatin Calcium 40 mg tablet  Carvedilol 25 mg tablet Oral  Clopidogrel 75 mg tablet  Glimepiride 4 mg tablet Oral  Tresiba   Valsartan 320 mg tablet     GU PSH: TRANSPERI NEEDLE PLACE, PROS - 2010     NON-GU PSH: No Non-GU PSH    GU PMH: BPH w/LUTS - 12/18/2019, Benign prostatic hyperplasia (BPH) with straining on urination, - 2017 Nocturia (Stable) - 12/18/2019, - 2018 Prostate Cancer - 12/18/2019, Prostate cancer, - 2017 Right renal neoplasm - 12/18/2019 ED due to arterial insufficiency, Erectile dysfunction due to arterial insufficiency - 2017 Overactive bladder, Overactive bladder - 2014      PMH Notes:   1) Prostate cancer: He is s/p treatment with brachytherapy on April 22, 2008. His radiation oncologist is Dr. Valere Dross.   D90: 96.42%, V100: 88.43%, 1.68 cc of rectum received the prescribed dose.  PSA nadir after treatment: 0.14 (Apr 2016)   TNM stage: cT1c Nx Mx  Gleason score: 3+3=6  PSA: 4.18  Prostate biopsy (02/23/08): 4/12 cores positive -- L lateral apex (40%), L lateral mid (20%), L mid (focus), L base (5%)  Prostate volume: 48.11  Pretreatment urinary function: His primary baseline voiding symptoms included frequency and nocturia. IPSS: 10.  Pretreatment erectile function: He did have severe erectile dysfunction. SHIM: 6.   2) Microscopic hematuria: He has undergone a complete evaluation including cystoscopy and CT imaging by Dr. Matilde Sprang in March 2008 which was negative.   3) BPH/LUTS: He has a history of urinary frequency, urgency, and nocturia which worsened after his radiation therapy.   Prior treatment: Vesicare (he was able to stop without change in his symptoms), Tamsulosin (able to stop)  Current treatment: None   4) Erectile dysfunction: He developed erectile dysfunction following radiation therapy for prostate cancer. He does not take nitrates.   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 Has not smoked since 12/05/1999.   Tobacco Use Assessment Completed: Used Tobacco in last 30 days? Has never drank.  Drinks 1 caffeinated drink per day.    REVIEW OF SYSTEMS:    GU Review Male:   Patient denies frequent urination, hard to postpone urination, burning/ pain with urination, get up at night to urinate, leakage of urine, stream starts and stops, trouble starting your streams, and have to strain to urinate .  Gastrointestinal (Lower):   Patient denies diarrhea and constipation.  Gastrointestinal (Upper):   Patient denies nausea and vomiting.  Constitutional:   Patient denies fever, night sweats, weight loss, and fatigue.  Skin:   Patient denies skin rash/ lesion and itching.  Eyes:   Patient denies blurred vision and double vision.  Ears/ Nose/ Throat:   Patient denies sore throat and sinus problems.  Hematologic/Lymphatic:   Patient denies swollen glands and easy bruising.  Cardiovascular:   Patient denies leg swelling and chest pains.  Respiratory:   Patient denies cough and shortness of breath.  Endocrine:   Patient denies excessive thirst.  Musculoskeletal:   Patient denies back pain and joint pain.  Neurological:   Patient  denies headaches and dizziness.  Psychologic:   Patient denies depression and anxiety.   VITAL SIGNS:      01/15/2020 11:09 AM  Weight 165 lb / 74.84 kg  Height 64 in / 162.56 cm  BP 141/72 mmHg  Pulse 61 /min  BMI 28.3 kg/m   MULTI-SYSTEM PHYSICAL EXAMINATION:    Constitutional: Well-nourished. No physical deformities. Normally developed. Good grooming.  Neck: Neck symmetrical, not swollen. Normal tracheal position.  Respiratory: No labored breathing, no use of accessory muscles. Clear bilaterally   Cardiovascular: Normal temperature, normal extremity pulses, no swelling, no varicosities. Regular rate and rhythm.  Lymphatic: No enlargement of neck, axillae, groin.  Skin: No paleness, no jaundice, no cyanosis. No lesion, no ulcer, no rash.  Neurologic / Psychiatric: Oriented to time, oriented to place, oriented to person. No depression, no anxiety, no agitation.  Gastrointestinal: No mass, no tenderness, no rigidity, non obese abdomen.  Eyes: Normal conjunctivae. Normal eyelids.  Ears, Nose, Mouth, and Throat: Left ear no scars, no lesions, no masses. Right ear no scars, no lesions, no masses. Nose no scars, no lesions, no masses. Normal hearing. Normal lips.  Musculoskeletal: Normal gait and station of head and neck.     Complexity of Data:  Records Review:   Pathology Reports, Previous Doctor Records, Previous Patient Records  X-Ray Review: C.T. Abdomen/Pelvis: Reviewed Films.  MRI Abdomen: Reviewed Films.     02/12/16 04/05/15 04/26/14 04/21/13 10/06/12 03/22/12 09/17/11 03/12/11  PSA  Total PSA 0.067 ng/dl 0.14  0.14  0.23  0.52  0.58  1.52  1.02    Notes:                     CLINICAL DATA: Chronic renal disease with proteinuria   EXAM:  RENAL / URINARY TRACT ULTRASOUND COMPLETE   COMPARISON: CT from 04/08/2006   FINDINGS:  Right Kidney:   Renal measurements: 9.8 x 7.1 x 6.7 cm. = volume: 246 mL. No  hydronephrosis is noted. A solid-appearing mass is noted in the mid  to upper pole which measures 6.5 cm in greatest dimension. This was  not seen on the prior CT examination and further evaluation for  underlying neoplasm is recommended. 2.3 cm cyst is noted in the  lower pole the right kidney.   Left Kidney:   Renal measurements: 9.3 x 4.7 x 5.0 cm. = volume: 115 mL. Diffuse  cortical thinning is noted. No mass lesion or hydronephrosis is  seen.   Bladder:   Appears normal for degree of bladder distention.   Other:   None.   IMPRESSION:  6.5 cm solid renal  mass within the mid to upper pole of the right  kidney suspicious for underlying neoplasm. Further evaluation by MRI  is recommended.   Right renal cyst.   No obstructive changes are noted.    Electronically Signed  By: Inez Catalina M.D.  On: 10/15/2019 23:44   CLINICAL DATA: Right renal mass on recent ultrasound.   EXAM:  MRI ABDOMEN WITHOUT AND WITH CONTRAST   TECHNIQUE:  Multiplanar multisequence MR imaging of the abdomen was performed  both before and after the administration of intravenous contrast.   CONTRAST: 110m MULTIHANCE GADOBENATE DIMEGLUMINE 529 MG/ML IV SOLN   COMPARISON: Renal ultrasound on 10/15/2019   FINDINGS:  Lower chest: No acute findings.   Hepatobiliary: Visualized portion is unremarkable.   Pancreas: No mass or inflammatory changes.   Spleen: Within normal limits in size and appearance.   Adrenals/Urinary Tract:  A 1.9 cm right adrenal mass shows signal  dropout chemical shift imaging, consistent with a benign adrenal  adenoma.   The left kidney is unremarkable in appearance. A small simple cyst  is seen in the lower pole the right kidney. A solid heterogeneously  enhancing mass is seen centered in the lateral interpolar region of  the right kidney, which measures 5.9 x 5.0 cm. This is consistent  with renal cell carcinoma. No evidence of hydronephrosis.   Stomach/Bowel: Visualized portion unremarkable.   Vascular/Lymphatic: No pathologically enlarged lymph nodes  identified. No abdominal aortic aneurysm. Although there is no  evidence of thrombus within the right renal vein, there is a T2  hyperintense filling defect in the IVC which measures 8 mm (image  8/4), consistent with nonocclusive thrombus. This shows no definite  contrast enhancement, which favors bland thrombus over tumor  thrombus.   Other: None.   Musculoskeletal: No suspicious bone lesions identified.   IMPRESSION:  5.9 cm solid heterogeneously enhancing mass centered in  the lateral  interpolar region of the right kidney, consistent with renal cell  carcinoma.   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 Lack of contrast enhancement  favors bland thrombus, however tumor thrombus cannot definitely be  excluded.   No other abdominal metastatic disease.   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   These results will be called to the ordering clinician or  representative by the Radiologist Assistant, and communication  documented in the PACS or [employer].    Electronically Signed  By: Marlaine Hind M.D.  On: 11/22/2019 16:39   PROCEDURES:          Urinalysis w/Scope Dipstick Dipstick Cont'd Micro  Color: Yellow Bilirubin: Neg mg/dL WBC/hpf: 0 - 5/hpf  Appearance: Clear Ketones: Neg mg/dL RBC/hpf: 0 - 2/hpf  Specific Gravity: 1.025 Blood: Trace ery/uL Bacteria: Rare (0-9/hpf)  pH: <=5.0 Protein: 2+ mg/dL Cystals: NS (Not Seen)  Glucose: Neg mg/dL Urobilinogen: 0.2 mg/dL Casts: NS (Not Seen)    Nitrites: Neg Trichomonas: Not Present    Leukocyte Esterase: Neg leu/uL Mucous: Not Present      Epithelial Cells: NS (Not Seen)      Yeast: NS (Not Seen)      Sperm: Not Present    ASSESSMENT:      ICD-10 Details  1 GU:   Renal cell carcinoma, right - C64.1    PLAN:           Orders X-Rays: C.T. Chest Without Contrast          Schedule Return Visit/Planned Activity: Other See Visit Notes             Note: Will call to schedule surgery.          Document Letter(s):  Created for Patient: Clinical Summary         Notes:   1. 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 Specifically, it is confirmed that he does have a high-grade, clear cell renal cell carcinoma based on his percutaneous biopsy. He understands that he would be at significant risk for development of metastatic disease with ongoing observation. We discussed the natural history of high-grade renal cell carcinoma and systemic therapy  is that can prolonged survival but are not curative. We again discussed that based on the central location of this tumor, curative treatment will require a radical nephrectomy and is not amenable to partial nephrectomy. He has not yet completed his metastatic  evaluation and will be rescheduled for a CT scan of the chest without contrast to confirm that he does not have evidence of metastatic disease. Assuming that he does not have metastatic disease, we discussed the risks and benefits of proceeding with a right laparoscopic radical nephrectomy.   Specifically, we discussed his estimated 15% risk of 30 day mortality with major surgery based on his age and comorbid conditions. We discussed the risk of worsening renal function and the possibility of necessitating dialysis. We have discussed the risks of treatment in detail including but not limited to bleeding, infection, heart attack, stroke, death, venothromoboembolism, cancer recurrence, injury/damage to surrounding organs and structures, urine leak, the possibility of open surgical conversion for patients undergoing minimally invasive surgery, the risk of developing chronic kidney disease and its associated implications, and the potential risk of end stage renal disease possibly necessitating dialysis.   After reviewing the risks and benefits of treatment, he does wish to proceed with curative treatment and a right laparoscopic radical nephrectomy. He will need to stop his Plavix 5 days prior to surgery and continue aspirin 81 mg. he will complete his CT scan of the chest for his metastatic evaluation. He understands that we will assess his postoperative renal function and have Nephrology involved if needed to assess his need for postoperative dialysis if indicated vs close follow up assessment of his renal function. He will be scheduled for a right laparoscopic radical nephrectomy.   CC: Dr. Lang Snow  Dr. Gean Quint    * Signed by Raynelle Bring, M.D.  on 01/15/20 at 3:35 PM (EST)*

## 2020-02-09 LAB — SARS CORONAVIRUS 2 (TAT 6-24 HRS): SARS Coronavirus 2: NEGATIVE

## 2020-02-11 ENCOUNTER — Inpatient Hospital Stay (HOSPITAL_COMMUNITY)
Admission: RE | Admit: 2020-02-11 | Discharge: 2020-02-13 | DRG: 658 | Disposition: A | Discharge status: Home / Self Care | Payer: Medicare Other | Attending: Urology | Admitting: Urology

## 2020-02-11 ENCOUNTER — Inpatient Hospital Stay (HOSPITAL_COMMUNITY): Payer: Medicare Other | Admitting: Anesthesiology

## 2020-02-11 ENCOUNTER — Encounter (HOSPITAL_COMMUNITY)
Admission: RE | Disposition: A | Discharge status: Home / Self Care | Payer: Self-pay | Source: Home / Self Care | Attending: Urology

## 2020-02-11 ENCOUNTER — Inpatient Hospital Stay (HOSPITAL_COMMUNITY): Payer: Medicare Other | Admitting: Physician Assistant

## 2020-02-11 ENCOUNTER — Encounter (HOSPITAL_COMMUNITY): Payer: Self-pay | Admitting: Urology

## 2020-02-11 ENCOUNTER — Other Ambulatory Visit: Payer: Self-pay

## 2020-02-11 DIAGNOSIS — Z87891 Personal history of nicotine dependence: Secondary | ICD-10-CM

## 2020-02-11 DIAGNOSIS — Z7902 Long term (current) use of antithrombotics/antiplatelets: Secondary | ICD-10-CM | POA: Diagnosis not present

## 2020-02-11 DIAGNOSIS — M109 Gout, unspecified: Secondary | ICD-10-CM | POA: Diagnosis not present

## 2020-02-11 DIAGNOSIS — E785 Hyperlipidemia, unspecified: Secondary | ICD-10-CM | POA: Diagnosis present

## 2020-02-11 DIAGNOSIS — N401 Enlarged prostate with lower urinary tract symptoms: Secondary | ICD-10-CM | POA: Diagnosis not present

## 2020-02-11 DIAGNOSIS — E7800 Pure hypercholesterolemia, unspecified: Secondary | ICD-10-CM | POA: Diagnosis present

## 2020-02-11 DIAGNOSIS — C641 Malignant neoplasm of right kidney, except renal pelvis: Secondary | ICD-10-CM | POA: Diagnosis not present

## 2020-02-11 DIAGNOSIS — N281 Cyst of kidney, acquired: Secondary | ICD-10-CM | POA: Diagnosis present

## 2020-02-11 DIAGNOSIS — D3501 Benign neoplasm of right adrenal gland: Secondary | ICD-10-CM | POA: Diagnosis present

## 2020-02-11 DIAGNOSIS — R3916 Straining to void: Secondary | ICD-10-CM | POA: Diagnosis present

## 2020-02-11 DIAGNOSIS — R351 Nocturia: Secondary | ICD-10-CM | POA: Diagnosis present

## 2020-02-11 DIAGNOSIS — Z800 Family history of malignant neoplasm of digestive organs: Secondary | ICD-10-CM | POA: Diagnosis not present

## 2020-02-11 DIAGNOSIS — N35919 Unspecified urethral stricture, male, unspecified site: Secondary | ICD-10-CM | POA: Diagnosis present

## 2020-02-11 DIAGNOSIS — J45909 Unspecified asthma, uncomplicated: Secondary | ICD-10-CM | POA: Diagnosis present

## 2020-02-11 DIAGNOSIS — Z8673 Personal history of transient ischemic attack (TIA), and cerebral infarction without residual deficits: Secondary | ICD-10-CM | POA: Diagnosis not present

## 2020-02-11 DIAGNOSIS — D649 Anemia, unspecified: Secondary | ICD-10-CM | POA: Diagnosis not present

## 2020-02-11 DIAGNOSIS — Z923 Personal history of irradiation: Secondary | ICD-10-CM

## 2020-02-11 DIAGNOSIS — Z8546 Personal history of malignant neoplasm of prostate: Secondary | ICD-10-CM | POA: Diagnosis not present

## 2020-02-11 DIAGNOSIS — I10 Essential (primary) hypertension: Secondary | ICD-10-CM | POA: Diagnosis not present

## 2020-02-11 DIAGNOSIS — Z20822 Contact with and (suspected) exposure to covid-19: Secondary | ICD-10-CM | POA: Diagnosis not present

## 2020-02-11 DIAGNOSIS — E119 Type 2 diabetes mellitus without complications: Secondary | ICD-10-CM | POA: Diagnosis not present

## 2020-02-11 DIAGNOSIS — M1990 Unspecified osteoarthritis, unspecified site: Secondary | ICD-10-CM | POA: Diagnosis present

## 2020-02-11 DIAGNOSIS — N2889 Other specified disorders of kidney and ureter: Secondary | ICD-10-CM | POA: Diagnosis present

## 2020-02-11 HISTORY — PX: LAPAROSCOPIC NEPHRECTOMY: SHX1930

## 2020-02-11 HISTORY — PX: CYSTOSCOPY: SHX5120

## 2020-02-11 LAB — BASIC METABOLIC PANEL
Anion gap: 10 (ref 5–15)
BUN: 39 mg/dL — ABNORMAL HIGH (ref 8–23)
CO2: 24 mmol/L (ref 22–32)
Calcium: 8.7 mg/dL — ABNORMAL LOW (ref 8.9–10.3)
Chloride: 103 mmol/L (ref 98–111)
Creatinine, Ser: 2.11 mg/dL — ABNORMAL HIGH (ref 0.61–1.24)
GFR, Estimated: 31 mL/min — ABNORMAL LOW (ref >60)
Glucose, Bld: 149 mg/dL — ABNORMAL HIGH (ref 70–99)
Potassium: 4.8 mmol/L (ref 3.5–5.1)
Sodium: 137 mmol/L (ref 135–145)

## 2020-02-11 LAB — HEMOGLOBIN AND HEMATOCRIT, BLOOD
HCT: 28.1 % — ABNORMAL LOW (ref 39.0–52.0)
Hemoglobin: 8.6 g/dL — ABNORMAL LOW (ref 13.0–17.0)

## 2020-02-11 LAB — GLUCOSE, CAPILLARY
Glucose-Capillary: 116 mg/dL — ABNORMAL HIGH (ref 70–99)
Glucose-Capillary: 249 mg/dL — ABNORMAL HIGH (ref 70–99)

## 2020-02-11 LAB — TYPE AND SCREEN
ABO/RH(D): B POS
Antibody Screen: NEGATIVE

## 2020-02-11 LAB — ABO/RH: ABO/RH(D): B POS

## 2020-02-11 SURGERY — NEPHRECTOMY, RADICAL, LAPAROSCOPIC, ADULT
Anesthesia: General | Laterality: Right

## 2020-02-11 MED ORDER — CHLORHEXIDINE GLUCONATE CLOTH 2 % EX PADS
6.0000 | MEDICATED_PAD | Freq: Every day | CUTANEOUS | Status: DC
  Administered 2020-02-12 – 2020-02-13 (×2): 6 via TOPICAL

## 2020-02-11 MED ORDER — LIDOCAINE HCL (PF) 2 % IJ SOLN
INTRAMUSCULAR | Status: AC
  Filled 2020-02-11: qty 5

## 2020-02-11 MED ORDER — PROPOFOL 10 MG/ML IV BOLUS
INTRAVENOUS | Status: AC
  Filled 2020-02-11: qty 20

## 2020-02-11 MED ORDER — INSULIN ASPART 100 UNIT/ML ~~LOC~~ SOLN
0.0000 [IU] | Freq: Every day | SUBCUTANEOUS | Status: DC
  Administered 2020-02-11: 2 [IU] via SUBCUTANEOUS

## 2020-02-11 MED ORDER — OXYCODONE HCL 5 MG PO TABS: 5.0000 mg | ORAL_TABLET | Freq: Once | ORAL | Status: DC | PRN

## 2020-02-11 MED ORDER — DIPHENHYDRAMINE HCL 50 MG/ML IJ SOLN: 12.5000 mg | Freq: Four times a day (QID) | INTRAMUSCULAR | Status: DC | PRN

## 2020-02-11 MED ORDER — SODIUM CHLORIDE (PF) 0.9 % IJ SOLN
INTRAMUSCULAR | Status: AC
  Filled 2020-02-11: qty 20

## 2020-02-11 MED ORDER — ROCURONIUM BROMIDE 10 MG/ML (PF) SYRINGE
PREFILLED_SYRINGE | INTRAVENOUS | Status: DC | PRN
  Administered 2020-02-11: 80 mg via INTRAVENOUS
  Administered 2020-02-11 (×2): 20 mg via INTRAVENOUS

## 2020-02-11 MED ORDER — DIPHENHYDRAMINE HCL 12.5 MG/5ML PO ELIX: 12.5000 mg | ORAL_SOLUTION | Freq: Four times a day (QID) | ORAL | Status: DC | PRN

## 2020-02-11 MED ORDER — AMLODIPINE BESYLATE 10 MG PO TABS
10.0000 mg | ORAL_TABLET | Freq: Every day | ORAL | Status: DC
  Administered 2020-02-12 – 2020-02-13 (×2): 10 mg via ORAL
  Filled 2020-02-11 (×2): qty 1

## 2020-02-11 MED ORDER — CHLORHEXIDINE GLUCONATE 0.12 % MT SOLN
15.0000 mL | Freq: Once | OROMUCOSAL | Status: AC
  Administered 2020-02-11: 15 mL via OROMUCOSAL

## 2020-02-11 MED ORDER — INSULIN ASPART 100 UNIT/ML ~~LOC~~ SOLN
0.0000 [IU] | Freq: Three times a day (TID) | SUBCUTANEOUS | Status: DC
  Administered 2020-02-12: 3 [IU] via SUBCUTANEOUS
  Administered 2020-02-12: 1 [IU] via SUBCUTANEOUS
  Administered 2020-02-12: 7 [IU] via SUBCUTANEOUS
  Administered 2020-02-13 (×2): 1 [IU] via SUBCUTANEOUS

## 2020-02-11 MED ORDER — EPHEDRINE SULFATE-NACL 50-0.9 MG/10ML-% IV SOSY
PREFILLED_SYRINGE | INTRAVENOUS | Status: DC | PRN
  Administered 2020-02-11: 10 mg via INTRAVENOUS

## 2020-02-11 MED ORDER — OXYCODONE HCL 5 MG PO TABS
5.0000 mg | ORAL_TABLET | ORAL | Status: DC | PRN
  Administered 2020-02-12 – 2020-02-13 (×3): 5 mg via ORAL
  Filled 2020-02-11 (×3): qty 1

## 2020-02-11 MED ORDER — CEFAZOLIN SODIUM-DEXTROSE 2-4 GM/100ML-% IV SOLN
2.0000 g | Freq: Once | INTRAVENOUS | Status: AC
  Administered 2020-02-11: 2 g via INTRAVENOUS
  Filled 2020-02-11: qty 100

## 2020-02-11 MED ORDER — HYDROMORPHONE HCL 1 MG/ML IJ SOLN: 0.2500 mg | INTRAMUSCULAR | Status: DC | PRN

## 2020-02-11 MED ORDER — ONDANSETRON HCL 4 MG/2ML IJ SOLN: 4.0000 mg | INTRAMUSCULAR | Status: DC | PRN

## 2020-02-11 MED ORDER — CEFAZOLIN SODIUM-DEXTROSE 1-4 GM/50ML-% IV SOLN
1.0000 g | Freq: Three times a day (TID) | INTRAVENOUS | Status: AC
  Administered 2020-02-11 – 2020-02-12 (×2): 1 g via INTRAVENOUS
  Filled 2020-02-11 (×2): qty 50

## 2020-02-11 MED ORDER — 0.9 % SODIUM CHLORIDE (POUR BTL) OPTIME
TOPICAL | Status: DC | PRN
  Administered 2020-02-11: 1000 mL

## 2020-02-11 MED ORDER — FENTANYL CITRATE (PF) 100 MCG/2ML IJ SOLN: 25.0000 ug | INTRAMUSCULAR | Status: DC | PRN

## 2020-02-11 MED ORDER — FENTANYL CITRATE (PF) 250 MCG/5ML IJ SOLN
INTRAMUSCULAR | Status: AC
  Filled 2020-02-11: qty 5

## 2020-02-11 MED ORDER — MAGNESIUM CITRATE PO SOLN
0.5000 | Freq: Once | ORAL | Status: DC
  Filled 2020-02-11: qty 296

## 2020-02-11 MED ORDER — FENTANYL CITRATE (PF) 250 MCG/5ML IJ SOLN
INTRAMUSCULAR | Status: DC | PRN
  Administered 2020-02-11: 100 ug via INTRAVENOUS

## 2020-02-11 MED ORDER — DEXTROSE-NACL 5-0.45 % IV SOLN: INTRAVENOUS | Status: DC

## 2020-02-11 MED ORDER — PHENYLEPHRINE HCL (PRESSORS) 10 MG/ML IV SOLN
INTRAVENOUS | Status: AC
  Filled 2020-02-11: qty 1

## 2020-02-11 MED ORDER — SODIUM CHLORIDE (PF) 0.9 % IJ SOLN
INTRAMUSCULAR | Status: DC | PRN
  Administered 2020-02-11: 20 mL

## 2020-02-11 MED ORDER — PROMETHAZINE HCL 25 MG/ML IJ SOLN: 6.2500 mg | INTRAMUSCULAR | Status: DC | PRN

## 2020-02-11 MED ORDER — ONDANSETRON HCL 4 MG/2ML IJ SOLN
INTRAMUSCULAR | Status: DC | PRN
  Administered 2020-02-11: 4 mg via INTRAVENOUS

## 2020-02-11 MED ORDER — PHENYLEPHRINE HCL-NACL 10-0.9 MG/250ML-% IV SOLN
INTRAVENOUS | Status: DC | PRN
  Administered 2020-02-11: 50 ug/min via INTRAVENOUS

## 2020-02-11 MED ORDER — ONDANSETRON HCL 4 MG/2ML IJ SOLN: 4.0000 mg | Freq: Once | INTRAMUSCULAR | Status: DC | PRN

## 2020-02-11 MED ORDER — CARVEDILOL 25 MG PO TABS
25.0000 mg | ORAL_TABLET | Freq: Two times a day (BID) | ORAL | Status: DC
  Administered 2020-02-12 – 2020-02-13 (×3): 25 mg via ORAL
  Filled 2020-02-11 (×3): qty 1

## 2020-02-11 MED ORDER — SUGAMMADEX SODIUM 200 MG/2ML IV SOLN
INTRAVENOUS | Status: DC | PRN
  Administered 2020-02-11: 200 mg via INTRAVENOUS

## 2020-02-11 MED ORDER — ASPIRIN 81 MG PO CHEW
81.0000 mg | CHEWABLE_TABLET | Freq: Every day | ORAL | Status: DC
  Administered 2020-02-12 – 2020-02-13 (×2): 81 mg via ORAL
  Filled 2020-02-11 (×2): qty 1

## 2020-02-11 MED ORDER — ONDANSETRON HCL 4 MG/2ML IJ SOLN
INTRAMUSCULAR | Status: AC
  Filled 2020-02-11: qty 2

## 2020-02-11 MED ORDER — HYDROMORPHONE HCL 1 MG/ML IJ SOLN
0.5000 mg | INTRAMUSCULAR | Status: DC | PRN
  Administered 2020-02-12: 1 mg via INTRAVENOUS
  Filled 2020-02-11: qty 1

## 2020-02-11 MED ORDER — LIDOCAINE 2% (20 MG/ML) 5 ML SYRINGE
INTRAMUSCULAR | Status: DC | PRN
  Administered 2020-02-11: 80 mg via INTRAVENOUS

## 2020-02-11 MED ORDER — DEXAMETHASONE SODIUM PHOSPHATE 10 MG/ML IJ SOLN
INTRAMUSCULAR | Status: AC
  Filled 2020-02-11: qty 1

## 2020-02-11 MED ORDER — LACTATED RINGERS IV SOLN: INTRAVENOUS | Status: DC

## 2020-02-11 MED ORDER — DOCUSATE SODIUM 100 MG PO CAPS
100.0000 mg | ORAL_CAPSULE | Freq: Two times a day (BID) | ORAL | Status: DC
  Administered 2020-02-11 – 2020-02-13 (×4): 100 mg via ORAL
  Filled 2020-02-11 (×4): qty 1

## 2020-02-11 MED ORDER — LIDOCAINE HCL URETHRAL/MUCOSAL 2 % EX GEL
CUTANEOUS | Status: AC
  Filled 2020-02-11: qty 30

## 2020-02-11 MED ORDER — BUPIVACAINE LIPOSOME 1.3 % IJ SUSP
20.0000 mL | Freq: Once | INTRAMUSCULAR | Status: AC
  Administered 2020-02-11: 20 mL
  Filled 2020-02-11: qty 20

## 2020-02-11 MED ORDER — PROPOFOL 10 MG/ML IV BOLUS
INTRAVENOUS | Status: DC | PRN
  Administered 2020-02-11: 150 mg via INTRAVENOUS

## 2020-02-11 MED ORDER — LACTATED RINGERS IR SOLN
Status: DC | PRN
  Administered 2020-02-11: 1000 mL

## 2020-02-11 MED ORDER — ORAL CARE MOUTH RINSE: 15.0000 mL | Freq: Once | OROMUCOSAL | Status: AC

## 2020-02-11 MED ORDER — DEXAMETHASONE SODIUM PHOSPHATE 10 MG/ML IJ SOLN
INTRAMUSCULAR | Status: DC | PRN
  Administered 2020-02-11: 5 mg via INTRAVENOUS

## 2020-02-11 MED ORDER — ROCURONIUM BROMIDE 10 MG/ML (PF) SYRINGE
PREFILLED_SYRINGE | INTRAVENOUS | Status: AC
  Filled 2020-02-11: qty 10

## 2020-02-11 MED ORDER — ACETAMINOPHEN 500 MG PO TABS
1000.0000 mg | ORAL_TABLET | Freq: Four times a day (QID) | ORAL | Status: DC
  Administered 2020-02-11 – 2020-02-13 (×7): 1000 mg via ORAL
  Filled 2020-02-11 (×7): qty 2

## 2020-02-11 MED ORDER — OXYCODONE HCL 5 MG/5ML PO SOLN: 5.0000 mg | Freq: Once | ORAL | Status: DC | PRN

## 2020-02-11 MED ORDER — ACETAMINOPHEN 500 MG PO TABS
1000.0000 mg | ORAL_TABLET | Freq: Once | ORAL | Status: AC
  Administered 2020-02-11: 1000 mg via ORAL
  Filled 2020-02-11: qty 2

## 2020-02-11 SURGICAL SUPPLY — 46 items
BAG LAPAROSCOPIC 12 15 PORT 16 (BASKET) ×2 IMPLANT
BAG RETRIEVAL 12/15 (BASKET) ×3
BAG ZIPLOCK 12X15 (MISCELLANEOUS) ×3 IMPLANT
BALLN NEPHROSTOMY (BALLOONS) ×3
BALLOON NEPHROSTOMY (BALLOONS) ×2 IMPLANT
BLADE EXTENDED COATED 6.5IN (ELECTRODE) IMPLANT
CATH FOLEY 2W COUNCIL 5CC 16FR (CATHETERS) ×3 IMPLANT
CHLORAPREP W/TINT 26 (MISCELLANEOUS) ×3 IMPLANT
CLIP VESOLOCK LG 6/CT PURPLE (CLIP) ×3 IMPLANT
CLIP VESOLOCK MED LG 6/CT (CLIP) ×3 IMPLANT
COVER SURGICAL LIGHT HANDLE (MISCELLANEOUS) ×3 IMPLANT
COVER WAND RF STERILE (DRAPES) IMPLANT
CUTTER FLEX LINEAR 45M (STAPLE) ×3 IMPLANT
DERMABOND ADVANCED (GAUZE/BANDAGES/DRESSINGS) ×1
DERMABOND ADVANCED .7 DNX12 (GAUZE/BANDAGES/DRESSINGS) ×2 IMPLANT
DRAPE INCISE IOBAN 66X45 STRL (DRAPES) ×3 IMPLANT
DRAPE LAPAROSCOPIC ABDOMINAL (DRAPES) IMPLANT
DRAPE WARM FLUID 44X44 (DRAPES) IMPLANT
ELECT PENCIL ROCKER SW 15FT (MISCELLANEOUS) ×3 IMPLANT
ELECT REM PT RETURN 15FT ADLT (MISCELLANEOUS) ×3 IMPLANT
GLOVE SURG ENC MOIS LTX SZ6.5 (GLOVE) ×9 IMPLANT
GLOVE SURG ENC TEXT LTX SZ7.5 (GLOVE) ×6 IMPLANT
GOWN STRL REUS W/TWL LRG LVL3 (GOWN DISPOSABLE) ×12 IMPLANT
GUIDEWIRE STR DUAL SENSOR (WIRE) ×3 IMPLANT
HEMOSTAT SURGICEL 4X8 (HEMOSTASIS) IMPLANT
HOLDER FOLEY CATH W/STRAP (MISCELLANEOUS) ×3 IMPLANT
IRRIG SUCT STRYKERFLOW 2 WTIP (MISCELLANEOUS) ×3
IRRIGATION SUCT STRKRFLW 2 WTP (MISCELLANEOUS) ×2 IMPLANT
KIT BASIN OR (CUSTOM PROCEDURE TRAY) ×3 IMPLANT
KIT TURNOVER KIT A (KITS) IMPLANT
RELOAD 45 VASCULAR/THIN (ENDOMECHANICALS) ×6 IMPLANT
SET TUBE SMOKE EVAC HIGH FLOW (TUBING) ×3 IMPLANT
SHEARS HARMONIC ACE PLUS 36CM (ENDOMECHANICALS) ×3 IMPLANT
SURGIFLO W/THROMBIN 8M KIT (HEMOSTASIS) IMPLANT
SUT MNCRL AB 4-0 PS2 18 (SUTURE) ×6 IMPLANT
SUT PDS AB 1 CTX 36 (SUTURE) ×6 IMPLANT
SUT VIC AB 2-0 SH 27 (SUTURE) ×1
SUT VIC AB 2-0 SH 27X BRD (SUTURE) ×2 IMPLANT
SUT VICRYL 0 UR6 27IN ABS (SUTURE) ×3 IMPLANT
TOWEL OR 17X26 10 PK STRL BLUE (TOWEL DISPOSABLE) ×3 IMPLANT
TOWEL OR NON WOVEN STRL DISP B (DISPOSABLE) ×3 IMPLANT
TRAY FOLEY MTR SLVR 16FR STAT (SET/KITS/TRAYS/PACK) ×3 IMPLANT
TRAY LAPAROSCOPIC (CUSTOM PROCEDURE TRAY) ×3 IMPLANT
TROCAR BLADELESS OPT 5 100 (ENDOMECHANICALS) ×3 IMPLANT
TROCAR XCEL 12X100 BLDLESS (ENDOMECHANICALS) ×3 IMPLANT
TROCAR XCEL BLUNT TIP 100MML (ENDOMECHANICALS) ×3 IMPLANT

## 2020-02-11 NOTE — Anesthesia Postprocedure Evaluation (Signed)
Anesthesia Post Note  Patient: Jason Nielsen  Procedure(s) Performed: LAPAROSCOPIC RADICAL NEPHRECTOMY (Right ) FLEXIBLE CYSTOSCOPY, URETHERAL DILITATION FOR URETHERAL STRICTURE AND COMPLEX CATHETER PLACEMENT     Patient location during evaluation: PACU Anesthesia Type: General Level of consciousness: awake and alert Pain management: pain level controlled Vital Signs Assessment: post-procedure vital signs reviewed and stable Respiratory status: spontaneous breathing, nonlabored ventilation and respiratory function stable Cardiovascular status: blood pressure returned to baseline and stable Postop Assessment: no apparent nausea or vomiting Anesthetic complications: no   No complications documented.  Last Vitals:  Vitals:   02/11/20 1600 02/11/20 1615  BP: 131/66 122/75  Pulse: (!) 57 (!) 57  Resp: 17 12  Temp:    SpO2: 97% 96%    Last Pain:  Vitals:   02/11/20 1515  TempSrc:   PainSc: 2                  Lynda Rainwater

## 2020-02-11 NOTE — Interval H&P Note (Signed)
History and Physical Interval Note:  02/11/2020 10:13 AM  Jason Nielsen  has presented today for surgery, with the diagnosis of RIGHT RENAL CELL CARCINOMA.  The various methods of treatment have been discussed with the patient and family. After consideration of risks, benefits and other options for treatment, the patient has consented to  Procedure(s): LAPAROSCOPIC RADICAL NEPHRECTOMY (Right) as a surgical intervention.  The patient's history has been reviewed, patient examined, no change in status, stable for surgery.  I have reviewed the patient's chart and labs.  Questions were answered to the patient's satisfaction.     [employer]

## 2020-02-11 NOTE — Transfer of Care (Signed)
Immediate Anesthesia Transfer of Care Note  Patient: Jason Nielsen  Procedure(s) Performed: LAPAROSCOPIC RADICAL NEPHRECTOMY (Right ) FLEXIBLE CYSTOSCOPY, URETHERAL DILITATION FOR URETHERAL STRICTURE AND COMPLEX CATHETER PLACEMENT  Patient Location: PACU  Anesthesia Type:General  Level of Consciousness: awake and alert and oriented   Airway & Oxygen Therapy: Patient Spontanous Breathing and Patient connected to face mask  Post-op Assessment: Report given to RN and Post -op Vital signs reviewed and stable  Post vital signs: Reviewed and stable  Last Vitals:  Vitals Value Taken Time  BP    Temp    Pulse 55 02/11/20 1516  Resp 13 02/11/20 1517  SpO2 97 % 02/11/20 1516  Vitals shown include unvalidated device data.  Last Pain:  Vitals:   02/11/20 0941  TempSrc:   PainSc: 0-No pain         Complications: No complications documented.

## 2020-02-11 NOTE — Anesthesia Procedure Notes (Signed)
Procedure Name: Intubation Performed by: Decklyn Hornik A, CRNA Pre-anesthesia Checklist: Patient identified, Emergency Drugs available, Suction available and Patient being monitored Patient Re-evaluated:Patient Re-evaluated prior to induction Oxygen Delivery Method: Circle system utilized Preoxygenation: Pre-oxygenation with 100% oxygen Induction Type: IV induction Ventilation: Mask ventilation without difficulty Laryngoscope Size: Miller and 2 Grade View: Grade I Tube type: Oral Tube size: 7.5 mm Number of attempts: 1 Airway Equipment and Method: Stylet Placement Confirmation: ETT inserted through vocal cords under direct vision,  positive ETCO2 and breath sounds checked- equal and bilateral Secured at: 22 cm Tube secured with: Tape Dental Injury: Teeth and Oropharynx as per pre-operative assessment        

## 2020-02-11 NOTE — Op Note (Signed)
Preoperative diagnosis: Right renal cell carcinoma, urethral stricture  Postoperative diagnosis: Right renal cell carcinoma, urethral stricture  Procedure: 1.  Right laparoscopic radical nephrectomy 2. Flexible cystoscopy and balloon dilation of urethral stricture with complex catheter placement  Surgeon: Pryor Curia. M.D.  Resident: Dr. Carmie Kanner  Anesthesia: General  Complications: None  EBL: 150 mL  IVF:  2000 mL crystalloid  Specimens: 1. Right kidney  Disposition of specimens: Pathology  Indication: Jason Nielsen is a 80 y.o. patient with a right renal tumor that is biopsy proven renal cell carcinoma.  After a thorough review of the management options for their renal mass, they elected to proceed with surgical treatment and the above procedure.  We have discussed the potential benefits and risks of the procedure, side effects of the proposed treatment, the likelihood of the patient achieving the goals of the procedure, and any potential problems that might occur during the procedure or recuperation. Informed consent has been obtained.  Description of procedure:  The patient was taken to the operating room and a general anesthetic was administered. Attempts by the nursing staff to place a urethral catheter preoperatively were unsuccessful. I therefore attempted to place a 70 [place] coud and subsequently a 11 [place] coud with resistance noted in the proximal urethra. At this point, the decision was made to perform flexible cystoscopy. This was performed under sterile conditions with Betadine prep. 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 A 0.38 sensor guidewire was inserted through the flexible cystoscope and eventually the true lumen was identified and the wire was able to be manipulated through the stricture into the bladder. Using the Utraxx 24 French dilating balloon, this was placed over  the wire and across the stricture. Confirmation of the appropriate location of the balloon was noted as the flexible cystoscope was passed into the urethra next to the balloon. The balloon was then dilated to approximately 18 mmHg pressure and left inflated for a few minutes. A 16 French council tip catheter was able to be placed over the wire after the balloon was removed. This was done without difficulty and with return of grossly clear urine. The patient was given preoperative antibiotics, placed in the right modified flank position, and prepped and draped in the usual sterile fashion. Next a preoperative timeout was performed.    A site was selected near the umbilicus for placement of the camera port. This was placed using a standard open Hassan technique which allowed entry into the peritoneal cavity under direct vision and without difficulty. A 12 mm Hassan cannula was placed and a pneumoperitoneum established. The camera was then used to inspect the abdomen and there was no evidence of any intra-abdominal injuries or other abnormalities. The remaining abdominal ports were then placed. A 12 mm port was placed in the right lower quadrant and a 5 mm port was placed in the right upper quadrant.  All ports were placed under direct vision without difficulty. There were noted to be omental adhesions upon entry of the abdomen. These were carefully taken down using the harmonic scalpel thereby exposing the right colon and kidney.  Utilizing the harmonic scalpel, the white line of Toldt was incised allowing the colon to be mobilized medially and the plane between the mesocolon and the anterior layer of Gerota's fascia to be developed and the kidney exposed.  The ureter and gonadal vein were identified inferiorly and the ureter was lifted anteriorly off  the psoas muscle.  Dissection proceeded superiorly along the gonadal vein until the renal vein was identified.  The renal hilum was then carefully isolated with a  combination of blunt and sharp dissectiong allowing the renal arterial and venous structures to be separated and isolated.   The renal artery was isolated and ligated with multiple Weck clips and subsequently divided.  The renal vein was then isolated. During this dissection, there was noted to be a second more posteriorly located renal vein. After both veins were isolated, the main renal vein was ligated and divided with a 45 mm Flex ETS stapler. The more posteriorly located renal vein was also then ligated and divided with a 45 mm flex ETS stapler.  Gerota's fascia was intentionally entered superiorly and the space between the adrenal gland and the kidney was developed allowing the adrenal gland to be spared.  The hepatorenal ligaments were divided with the harmonic scalpel.  The lateral and posterior attachements to the kidney were then divided.  The ureter was ligated with Weck clips and divided allowing the specimen to be freed from all surrounding structures.  The kidney specimen was then placed into a 12 mm retrieval bag.  The renal hilum, liver, adrenal bed and gonadal vein areas were each inspected and hemostasis was ensured with the pneomperitoneal pressures lowered.  The 12 mm lower quadrant port was then closed with a 0-vicryl suture placed laparoscopically to close the fascia of this incision. All remaining ports were removed under direct vision.  The kidney specimen was removed intact within the retrieval bag via the camera port site after this incision was extended slightly. This fascial opening was then closed with two #1 PDS sutures.    All incisions were injected with local anesthetic and reapproximated at the skin with 4-0 monocryl sutures.  Dermabond was applied to the skin. The patient tolerated the procedure well and without complications and was transferred to the recovery unit in satisfactory condition.   Pryor Curia MD

## 2020-02-11 NOTE — Anesthesia Preprocedure Evaluation (Addendum)
Anesthesia Evaluation    Reviewed: Allergy & Precautions, Patient's Chart, lab work & pertinent test results, reviewed documented beta blocker date and time   Airway Mallampati: II  TM Distance: >3 FB Neck ROM: Full    Dental no notable dental hx.    Pulmonary former smoker,    Pulmonary exam normal breath sounds clear to auscultation       Cardiovascular hypertension, Pt. on medications and Pt. on home beta blockers Normal cardiovascular exam Rhythm:Regular Rate:Normal     Neuro/Psych CVA negative psych ROS   GI/Hepatic negative GI ROS, Neg liver ROS,   Endo/Other  diabetes, Poorly Controlled, Type 2Last a1c 7.9  Renal/GU Renal Insufficiency and CRFRenal diseaseCr 2.3 Right renal cell carcinoma   negative genitourinary   Musculoskeletal  (+) Arthritis , Osteoarthritis,    Abdominal   Peds  Hematology  (+) Blood dyscrasia, anemia , H/H 9.9/29   Anesthesia Other Findings Hx prostate ca  Reproductive/Obstetrics negative OB ROS                            Anesthesia Physical Anesthesia Plan  ASA: III  Anesthesia Plan: General   Post-op Pain Management:    Induction: Intravenous  PONV Risk Score and Plan: 2 and Ondansetron, Treatment may vary due to age or medical condition and Midazolam  Airway Management Planned: Oral ETT  Additional Equipment: None  Intra-op Plan:   Post-operative Plan: Extubation in OR  Informed Consent: I have reviewed the patients History and Physical, chart, labs and discussed the procedure including the risks, benefits and alternatives for the proposed anesthesia with the patient or authorized representative who has indicated his/her understanding and acceptance.     Dental advisory given  Plan Discussed with: CRNA  Anesthesia Plan Comments:        Anesthesia Quick Evaluation

## 2020-02-11 NOTE — Discharge Instructions (Addendum)
1.  Activity:  You are encouraged to ambulate frequently (about every hour during waking hours) to help prevent blood clots from forming in your legs or lungs.  However, you should not engage in any heavy lifting (> 10-15 lbs), strenuous activity, or straining. 2. Diet: You should advance your diet as instructed by your physician.  It will be normal to have some bloating, nausea, and abdominal discomfort intermittently. 3. Prescriptions:  You will be provided a prescription for pain medication to take as needed.  If your pain is not severe enough to require the prescription pain medication, you may take extra strength Tylenol instead which will have less side effects.  You should also take a prescribed stool softener to avoid straining with bowel movements as the prescription pain medication may constipate you. You may restart your plavix tomorrow. Please start taking the antibiotic cefdinir the day prior to your foley catheter removal.  4. Incisions: You may remove your dressing bandages 48 hours after surgery if not removed in the hospital.  You will either have some small staples or special tissue glue at each of the incision sites. Once the bandages are removed (if present), the incisions may stay open to air.  You may start showering (but not soaking or bathing in water) the 2nd day after surgery and the incisions simply need to be patted dry after the shower.  No additional care is needed. 5. What to call us about: You should call the office [phone]) if you develop fever > 101 or develop persistent vomiting.

## 2020-02-11 NOTE — Progress Notes (Signed)
Patient ID: Jason Nielsen, male   DOB: Mar 15, 1940, 80 y.o.   MRN: [id number]  Post-op note  Subjective: The patient is doing well.  No complaints.  Objective: Vital signs in last 24 hours: Temp:  [97 F (36.1 C)-98 F (36.7 C)] 97 F (36.1 C) (02/07 1515) Pulse Rate:  [55-80] 57 (02/07 1615) Resp:  [11-18] 14 (02/07 1700) BP: (120-155)/(65-80) 136/76 (02/07 1700) SpO2:  [92 %-100 %] 100 % (02/07 1700) Weight:  [78 kg] 78 kg (02/07 0941)  Intake/Output from previous day: No intake/output data recorded. Intake/Output this shift: Total I/O In: 1000 [I.V.:1000] Out: 300 [Urine:150; Blood:150]  Physical Exam:  General: Alert and oriented. Abdomen: Soft, Nondistended. Incisions: Clean and dry. GU: Urine clear.  Lab Results: Recent Labs    02/11/20 1529  HGB 8.6*  HCT 28.1*   Lab Results  Component Value Date   CREATININE 2.11 (H) 02/11/2020   CREATININE 2.30 (H) 02/07/2020   CREATININE 2.14 (H) 02/04/2020     Assessment/Plan: POD#0   1) Continue to monitor, ambulate, IS, monitor renal function   Jason Nielsen. MD   LOS: 0 days   Jason Nielsen 02/11/2020, 5:15 PM

## 2020-02-12 ENCOUNTER — Encounter (HOSPITAL_COMMUNITY): Payer: Self-pay | Admitting: Urology

## 2020-02-12 LAB — GLUCOSE, CAPILLARY
Glucose-Capillary: 154 mg/dL — ABNORMAL HIGH (ref 70–99)
Glucose-Capillary: 331 mg/dL — ABNORMAL HIGH (ref 70–99)
Glucose-Capillary: 205 mg/dL — ABNORMAL HIGH (ref 70–99)
Glucose-Capillary: 127 mg/dL — ABNORMAL HIGH (ref 70–99)
Glucose-Capillary: 185 mg/dL — ABNORMAL HIGH (ref 70–99)

## 2020-02-12 LAB — BASIC METABOLIC PANEL
Anion gap: 9 (ref 5–15)
BUN: 42 mg/dL — ABNORMAL HIGH (ref 8–23)
CO2: 21 mmol/L — ABNORMAL LOW (ref 22–32)
Calcium: 8.4 mg/dL — ABNORMAL LOW (ref 8.9–10.3)
Chloride: 102 mmol/L (ref 98–111)
Creatinine, Ser: 2.55 mg/dL — ABNORMAL HIGH (ref 0.61–1.24)
GFR, Estimated: 25 mL/min — ABNORMAL LOW (ref >60)
Glucose, Bld: 381 mg/dL — ABNORMAL HIGH (ref 70–99)
Potassium: 4.9 mmol/L (ref 3.5–5.1)
Sodium: 132 mmol/L — ABNORMAL LOW (ref 135–145)

## 2020-02-12 LAB — HEMOGLOBIN AND HEMATOCRIT, BLOOD
HCT: 27.5 % — ABNORMAL LOW (ref 39.0–52.0)
Hemoglobin: 8.5 g/dL — ABNORMAL LOW (ref 13.0–17.0)

## 2020-02-12 MED ORDER — BISACODYL 10 MG RE SUPP
10.0000 mg | Freq: Once | RECTAL | Status: AC
  Administered 2020-02-12: 10 mg via RECTAL
  Filled 2020-02-12: qty 1

## 2020-02-12 MED ORDER — INSULIN GLARGINE 100 UNIT/ML ~~LOC~~ SOLN
9.0000 [IU] | Freq: Every day | SUBCUTANEOUS | Status: DC
  Administered 2020-02-12 – 2020-02-13 (×2): 9 [IU] via SUBCUTANEOUS
  Filled 2020-02-12 (×2): qty 0.09

## 2020-02-12 NOTE — Progress Notes (Signed)
1 Day Post-Op Subjective: The patient is doing well.  Ambulated twice last night. No nausea or vomiting. Pain is adequately controlled. Is hungry for breakfast this morning. No flatus yet.   Objective: Vital signs in last 24 hours: Temp:  [97 F (36.1 C)-98.5 F (36.9 C)] 97.9 F (36.6 C) (02/08 0522) Pulse Rate:  [55-80] 70 (02/08 0522) Resp:  [11-20] 16 (02/08 0522) BP: (120-156)/(46-84) 136/62 (02/08 0522) SpO2:  [92 %-100 %] 98 % (02/08 0522) Weight:  [78 kg] 78 kg (02/07 0941)  Intake/Output from previous day: 02/07 0701 - 02/08 0700 In: 2845.8 [P.O.:240; I.V.:2555.8; IV Piggyback:50] Out: 800 [Urine:650; Blood:150] Intake/Output this shift: No intake/output data recorded.  Physical Exam:  General: Alert and oriented. CV: RRR Lungs: Clear bilaterally. GI: Soft, Nondistended. Incisions: Clean and dry. Urine: Clear yellow Extremities: Nontender, no erythema, no edema.  Lab Results: Recent Labs    02/11/20 1529 02/12/20 0517  HGB 8.6* 8.5*  HCT 28.1* 27.5*          Recent Labs    02/07/20 0611 02/11/20 1529 02/12/20 0517  CREATININE 2.30* 2.11* 2.55*           Results for orders placed or performed during the hospital encounter of 02/11/20 (from the past 24 hour(s))  Glucose, capillary     Status: Abnormal   Collection Time: 02/11/20  9:36 AM  Result Value Ref Range   Glucose-Capillary 116 (H) 70 - 99 mg/dL   Comment 1 Notify RN    Comment 2 Document in Chart   ABO/Rh     Status: None   Collection Time: 02/11/20  9:50 AM  Result Value Ref Range   ABO/RH(D)      B POS Performed at [facility], [place] [address], [place],  [address]   Glucose, capillary     Status: Abnormal   Collection Time: 02/11/20  3:22 PM  Result Value Ref Range   Glucose-Capillary 154 (H) 70 - 99 mg/dL  Basic metabolic panel     Status: Abnormal   Collection Time: 02/11/20  3:29 PM  Result Value Ref Range   Sodium 137 135 - 145 mmol/L   Potassium  4.8 3.5 - 5.1 mmol/L   Chloride 103 98 - 111 mmol/L   CO2 24 22 - 32 mmol/L   Glucose, Bld 149 (H) 70 - 99 mg/dL   BUN 39 (H) 8 - 23 mg/dL   Creatinine, Ser 2.11 (H) 0.61 - 1.24 mg/dL   Calcium 8.7 (L) 8.9 - 10.3 mg/dL   GFR, Estimated 31 (L) >60 mL/min   Anion gap 10 5 - 15  Hemoglobin and hematocrit, blood     Status: Abnormal   Collection Time: 02/11/20  3:29 PM  Result Value Ref Range   Hemoglobin 8.6 (L) 13.0 - 17.0 g/dL   HCT 28.1 (L) 39.0 - 52.0 %  Glucose, capillary     Status: Abnormal   Collection Time: 02/11/20  9:39 PM  Result Value Ref Range   Glucose-Capillary 249 (H) 70 - 99 mg/dL  Basic metabolic panel     Status: Abnormal   Collection Time: 02/12/20  5:17 AM  Result Value Ref Range   Sodium 132 (L) 135 - 145 mmol/L   Potassium 4.9 3.5 - 5.1 mmol/L   Chloride 102 98 - 111 mmol/L   CO2 21 (L) 22 - 32 mmol/L   Glucose, Bld 381 (H) 70 - 99 mg/dL   BUN 42 (H) 8 - 23 mg/dL  Creatinine, Ser 2.55 (H) 0.61 - 1.24 mg/dL   Calcium 8.4 (L) 8.9 - 10.3 mg/dL   GFR, Estimated 25 (L) >60 mL/min   Anion gap 9 5 - 15  Hemoglobin and hematocrit, blood     Status: Abnormal   Collection Time: 02/12/20  5:17 AM  Result Value Ref Range   Hemoglobin 8.5 (L) 13.0 - 17.0 g/dL   HCT 27.5 (L) 39.0 - 52.0 %    Assessment/Plan: POD# 1 s/p laparoscopic nephrectomy.  1) Ambulate, Incentive spirometry 2) Advance diet as tolerated 3) Transition to oral pain medication 4) Dulcolax suppository 5) Urethral catheter to stay in on discharge for 1 week 6) Repeat labs tomorrow to trend creatinine given preop renal function. Healing fistula on left arm.     LOS: 1 day   Carmie Kanner 02/12/2020, 7:24 AM

## 2020-02-12 NOTE — Progress Notes (Signed)
Patient ambulated on hall way again at this time, tolerated well.

## 2020-02-12 NOTE — Progress Notes (Signed)
Pt ambulated on hall way at this time, tolerated well.  

## 2020-02-13 LAB — BASIC METABOLIC PANEL
Anion gap: 11 (ref 5–15)
BUN: 44 mg/dL — ABNORMAL HIGH (ref 8–23)
CO2: 22 mmol/L (ref 22–32)
Calcium: 8.3 mg/dL — ABNORMAL LOW (ref 8.9–10.3)
Chloride: 102 mmol/L (ref 98–111)
Creatinine, Ser: 2.8 mg/dL — ABNORMAL HIGH (ref 0.61–1.24)
GFR, Estimated: 22 mL/min — ABNORMAL LOW (ref >60)
Glucose, Bld: 186 mg/dL — ABNORMAL HIGH (ref 70–99)
Potassium: 4.4 mmol/L (ref 3.5–5.1)
Sodium: 135 mmol/L (ref 135–145)

## 2020-02-13 LAB — CBC
HCT: 25.8 % — ABNORMAL LOW (ref 39.0–52.0)
Hemoglobin: 7.8 g/dL — ABNORMAL LOW (ref 13.0–17.0)
MCH: 30.2 pg (ref 26.0–34.0)
MCHC: 30.2 g/dL (ref 30.0–36.0)
MCV: 100 fL (ref 80.0–100.0)
Platelets: 155 10*3/uL (ref 150–400)
RBC: 2.58 MIL/uL — ABNORMAL LOW (ref 4.22–5.81)
RDW: 15 % (ref 11.5–15.5)
WBC: 8.1 10*3/uL (ref 4.0–10.5)
nRBC: 0 % (ref 0.0–0.2)

## 2020-02-13 LAB — GLUCOSE, CAPILLARY
Glucose-Capillary: 140 mg/dL — ABNORMAL HIGH (ref 70–99)
Glucose-Capillary: 136 mg/dL — ABNORMAL HIGH (ref 70–99)

## 2020-02-13 MED ORDER — OXYCODONE HCL 5 MG PO TABS: 5.0000 mg | ORAL_TABLET | Freq: Three times a day (TID) | ORAL | 0 refills | Status: DC | PRN

## 2020-02-13 MED ORDER — DOCUSATE SODIUM 100 MG PO CAPS: 100.0000 mg | ORAL_CAPSULE | Freq: Two times a day (BID) | ORAL | 2 refills | Status: AC

## 2020-02-13 MED ORDER — CEFDINIR 300 MG PO CAPS: 300.0000 mg | ORAL_CAPSULE | Freq: Every day | ORAL | 0 refills | Status: AC

## 2020-02-13 NOTE — Discharge Summary (Signed)
Date of admission: 02/11/2020  Date of discharge: 02/13/2020  Admission diagnosis: Right renal mass   Discharge diagnosis: Right renal mass   History and Physical: For full details, please see admission history and physical. Briefly, Jason Nielsen is a 80 y.o. gentleman with large right renal mass. After discussing management/treatment options, he elected to proceed with surgical treatment.  Hospital Course: Landen Knoedler was taken to the operating room on 02/11/2020 and underwent a laparoscopic right radical nephrectomy. He was also noted to have a urethral stricture and underwent balloon dilation prior to foley placement. He tolerated this procedure well and without complications. Postoperatively, he was able to be transferred to a regular hospital room following recovery from anesthesia.  He was able to begin ambulating the night of surgery. He remained hemodynamically stable overnight.  He had excellent urine output and creatinine had a slight increase post op. He remained in house until POD2 to monitor creatinine, which remained relatively stable.  He was transitioned to oral pain medication, tolerated a regular diet, and had met all discharge criteria and was able to be discharged home later on POD#2.  Laboratory values:  Recent Labs    02/11/20 1529 02/12/20 0517 02/13/20 0533  HGB 8.6* 8.5* 7.8*  HCT 28.1* 27.5* 25.8*    Disposition: Home  Discharge instruction: He was instructed to be ambulatory but to refrain from heavy lifting, strenuous activity, or driving. He was instructed on urethral catheter care.  Discharge medications:   Allergies as of 02/13/2020   No Known Allergies     Medication List    TAKE these medications   allopurinol 100 MG tablet Commonly known as: ZYLOPRIM Take 100 mg by mouth daily.   amLODipine 10 MG tablet Commonly known as: NORVASC Take 10 mg by mouth daily.   atorvastatin 40 MG tablet Commonly known as: LIPITOR Take 40 mg by mouth daily.    carvedilol 25 MG tablet Commonly known as: COREG Take 25 mg by mouth 2 (two) times daily.   cefdinir 300 MG capsule Commonly known as: OMNICEF Take 1 capsule (300 mg total) by mouth daily for 3 days. Start taking the day prior to your catheter removal   clopidogrel 75 MG tablet Commonly known as: PLAVIX Take 75 mg by mouth daily.   docusate sodium 100 MG capsule Commonly known as: Colace Take 1 capsule (100 mg total) by mouth 2 (two) times daily.   ferrous sulfate 325 (65 FE) MG tablet Take 325 mg by mouth daily.   HYDROcodone-acetaminophen 5-325 MG tablet Commonly known as: Norco Take 1 tablet by mouth every 6 (six) hours as needed for moderate pain.   Tyler Aas FlexTouch 200 UNIT/ML FlexTouch Pen Generic drug: insulin degludec Inject 18 Units into the skin daily.   valsartan 320 MG tablet Commonly known as: DIOVAN Take 320 mg by mouth daily.       Followup: He will followup in 1 week for catheter removal and to discuss his surgical pathology results.

## 2020-02-13 NOTE — Care Management Important Message (Signed)
Important Message  Patient Details IM Letter given tot the Patient. Name: Jason Nielsen MRN: [id number] Date of Birth: 1940/11/30   Medicare Important Message Given:  Yes     Kerin Salen 02/13/2020, 10:36 AM

## 2020-02-13 NOTE — Plan of Care (Signed)

## 2020-02-13 NOTE — Progress Notes (Signed)
Pt discharged home today per [name]. Pt's IV site D/C'd and WDL. Pt's VSS. Pt provided with home medication list, discharge instructions and prescriptions. Verbalized understanding. Pt and family provided with home education on foley catheter. Verbalized understanding. Pt left floor via WC in stable condition accompanied by NT.

## 2020-02-13 NOTE — Progress Notes (Signed)
Patient ID: Jason Nielsen, male   DOB: 1940/11/12, 80 y.o.   MRN: [id number]  2 Days Post-Op Subjective: Pt doing well.  Tolerating diet.  Ambulating well.  Pain controlled.  Passed flatus.  Objective: Vital signs in last 24 hours: Temp:  [97.4 F (36.3 C)-98.7 F (37.1 C)] 97.4 F (36.3 C) (02/09 0544) Pulse Rate:  [70-76] 72 (02/09 0544) Resp:  [14-20] 18 (02/09 0544) BP: (126-143)/(66-79) 128/79 (02/09 0544) SpO2:  [95 %-100 %] 100 % (02/09 0544)  Intake/Output from previous day: 02/08 0701 - 02/09 0700 In: 1405.1 [P.O.:1080; I.V.:325.1] Out: 1175 [Urine:1175] Intake/Output this shift: No intake/output data recorded.  Physical Exam:  General: Alert and oriented Abdomen: Soft, ND, positive BS Incisions: C/D/I Ext: NT, No erythema  Lab Results: Recent Labs    02/11/20 1529 02/12/20 0517 02/13/20 0533  HGB 8.6* 8.5* 7.8*  HCT 28.1* 27.5* 25.8*   BMET Recent Labs    02/12/20 0517 02/13/20 0533  NA 132* 135  K 4.9 4.4  CL 102 102  CO2 21* 22  GLUCOSE 381* 186*  BUN 42* 44*  CREATININE 2.55* 2.80*  CALCIUM 8.4* 8.3*     Studies/Results: Path pending.  Assessment/Plan: POD # 2 s/p right lap radical nephrectomy - Will plan to d/c home today as renal function is stabilizing.  Jason Nielsen has f/u appt with Dr. Candiss Norse (Nephrology) on Monday to recheck renal function. - Will keep Foley due to urethral stricture with plans for f/u voiding trial scheduled for Monday    LOS: 2 days   Jason Nielsen 02/13/2020, 7:59 AM

## 2020-02-14 LAB — SURGICAL PATHOLOGY

## 2020-02-18 DIAGNOSIS — I129 Hypertensive chronic kidney disease with stage 1 through stage 4 chronic kidney disease, or unspecified chronic kidney disease: Secondary | ICD-10-CM | POA: Diagnosis not present

## 2020-02-18 DIAGNOSIS — Z905 Acquired absence of kidney: Secondary | ICD-10-CM | POA: Diagnosis not present

## 2020-02-18 DIAGNOSIS — N2889 Other specified disorders of kidney and ureter: Secondary | ICD-10-CM | POA: Diagnosis not present

## 2020-02-18 DIAGNOSIS — N184 Chronic kidney disease, stage 4 (severe): Secondary | ICD-10-CM | POA: Diagnosis not present

## 2020-02-18 DIAGNOSIS — D49511 Neoplasm of unspecified behavior of right kidney: Secondary | ICD-10-CM | POA: Diagnosis not present

## 2020-02-18 DIAGNOSIS — I82220 Acute embolism and thrombosis of inferior vena cava: Secondary | ICD-10-CM | POA: Diagnosis not present

## 2020-02-18 DIAGNOSIS — D631 Anemia in chronic kidney disease: Secondary | ICD-10-CM | POA: Diagnosis not present

## 2020-02-18 DIAGNOSIS — I770 Arteriovenous fistula, acquired: Secondary | ICD-10-CM | POA: Diagnosis not present

## 2020-02-18 DIAGNOSIS — R609 Edema, unspecified: Secondary | ICD-10-CM | POA: Diagnosis not present

## 2020-02-27 DIAGNOSIS — C641 Malignant neoplasm of right kidney, except renal pelvis: Secondary | ICD-10-CM | POA: Diagnosis not present

## 2020-03-04 DIAGNOSIS — E119 Type 2 diabetes mellitus without complications: Secondary | ICD-10-CM | POA: Diagnosis not present

## 2020-03-04 DIAGNOSIS — H40013 Open angle with borderline findings, low risk, bilateral: Secondary | ICD-10-CM | POA: Diagnosis not present

## 2020-03-04 DIAGNOSIS — H40033 Anatomical narrow angle, bilateral: Secondary | ICD-10-CM | POA: Diagnosis not present

## 2020-03-04 DIAGNOSIS — H2513 Age-related nuclear cataract, bilateral: Secondary | ICD-10-CM | POA: Diagnosis not present

## 2020-03-14 DIAGNOSIS — N184 Chronic kidney disease, stage 4 (severe): Secondary | ICD-10-CM | POA: Diagnosis not present

## 2020-03-18 ENCOUNTER — Ambulatory Visit (HOSPITAL_COMMUNITY)
Admission: RE | Admit: 2020-03-18 | Discharge: 2020-03-18 | Disposition: A | Discharge status: Home / Self Care | Payer: Medicare Other | Source: Ambulatory Visit | Attending: Vascular Surgery | Admitting: Vascular Surgery

## 2020-03-18 ENCOUNTER — Other Ambulatory Visit: Payer: Self-pay

## 2020-03-18 ENCOUNTER — Ambulatory Visit (INDEPENDENT_AMBULATORY_CARE_PROVIDER_SITE_OTHER): Payer: Medicare Other | Admitting: Physician Assistant

## 2020-03-18 VITALS — BP 141/69 | HR 76 | Temp 98.4°F | Resp 20 | Ht 64.0 in | Wt 167.0 lb

## 2020-03-18 DIAGNOSIS — N184 Chronic kidney disease, stage 4 (severe): Secondary | ICD-10-CM

## 2020-03-18 DIAGNOSIS — Z794 Long term (current) use of insulin: Secondary | ICD-10-CM | POA: Insufficient documentation

## 2020-03-18 DIAGNOSIS — E1165 Type 2 diabetes mellitus with hyperglycemia: Secondary | ICD-10-CM | POA: Insufficient documentation

## 2020-03-18 DIAGNOSIS — Z0000 Encounter for general adult medical examination without abnormal findings: Secondary | ICD-10-CM | POA: Insufficient documentation

## 2020-03-18 NOTE — H&P (View-Only) (Signed)
    Postoperative Access Visit   History of Present Illness   Jason Nielsen is a 80 y.o. year old male who presents for postoperative follow-up for: left upper extremity brachiocephalic AV fistula by Dr. Stanford Breed on 02/07/20. The patient's wounds are well healed.  The patient notes no steal symptoms.  He reports occasional forearm numbness to the touch. He recently underwent a laparoscopic right radical nephrectomy by 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 His Nephrologist is Dr. Candiss Norse from [facility]. 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  left arm Incision is healed, 2+ radial pulse, hand grip is 5/5, sensation in digits is intact, palpable thrill, bruit can be auscultated. Fistula is deep in the left upper arm   Non Invasive Evaluation: 03/18/20 Findings:  +--------------------+----------+-----------------+--------+  AVF         PSV (cm/s)Flow Vol (mL/min)Comments  +--------------------+----------+-----------------+--------+  Native artery inflow  289     2139          +--------------------+----------+-----------------+--------+  AVF Anastomosis     650                 +--------------------+----------+-----------------+--------+     +------------+----------+-------------+----------+--------+  OUTFLOW VEINPSV (cm/s)Diameter (cm)Depth (cm)Describe  +------------+----------+-------------+----------+--------+  Prox UA     182    0.80     1.20        +------------+----------+-------------+----------+--------+  Mid UA     236    0.68     1.24        +------------+----------+-------------+----------+--------+  Dist UA      288    0.60     1.37        +------------+----------+-------------+----------+--------+  AC Fossa    344    0.60     1.21        +------------+----------+-------------+----------+--------+   Medical Decision Making    [name] is a 80 y.o. year old male who presents s/p left upper extremity brachiocephalic AV fistula by Dr. Stanford Breed on 02/07/20. His incision has healed nicely. Patent is without signs or symptoms of steal syndrome. Based on duplex today the fistula has matured nicely but is too deep in the left upper extremity. I have discussed recommendation for and need for elevation/ transposition of the fistula. He is not on HD yet and just had a radial nephrectomy. I discussed with him that the second stage can be scheduled when he has recovered further from his two recent surgeries. I explained though that it would delay the readiness of the fistula should he need to initiate dialysis. He would like to go ahead and proceed with surgery at this time.   He is on Plavix which will need to be held for 5 days  I will have him scheduled for second stage BC fistula transposition with Dr. Susanne Borders, PA-C [facility] Office: [phone]  Clinic MD: Dr. Stanford Breed Dr. Carlis Abbott

## 2020-03-18 NOTE — Progress Notes (Signed)
    Postoperative Access Visit   History of Present Illness   Jason Nielsen is a 80 y.o. year old male who presents for postoperative follow-up for: left upper extremity brachiocephalic AV fistula by Dr. Stanford Breed on 02/07/20. The patient's wounds are well healed.  The patient notes no steal symptoms.  He reports occasional forearm numbness to the touch. He recently underwent a laparoscopic right radical nephrectomy by 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 His Nephrologist is Dr. Candiss Norse from [facility]. 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  left arm Incision is healed, 2+ radial pulse, hand grip is 5/5, sensation in digits is intact, palpable thrill, bruit can be auscultated. Fistula is deep in the left upper arm   Non Invasive Evaluation: 03/18/20 Findings:  +--------------------+----------+-----------------+--------+  AVF         PSV (cm/s)Flow Vol (mL/min)Comments  +--------------------+----------+-----------------+--------+  Native artery inflow  289     2139          +--------------------+----------+-----------------+--------+  AVF Anastomosis     650                 +--------------------+----------+-----------------+--------+     +------------+----------+-------------+----------+--------+  OUTFLOW VEINPSV (cm/s)Diameter (cm)Depth (cm)Describe  +------------+----------+-------------+----------+--------+  Prox UA     182    0.80     1.20        +------------+----------+-------------+----------+--------+  Mid UA     236    0.68     1.24        +------------+----------+-------------+----------+--------+  Dist UA      288    0.60     1.37        +------------+----------+-------------+----------+--------+  AC Fossa    344    0.60     1.21        +------------+----------+-------------+----------+--------+   Medical Decision Making    Farhaan Mabee is a 80 y.o. year old male who presents s/p left upper extremity brachiocephalic AV fistula by Dr. Stanford Breed on 02/07/20. His incision has healed nicely. Patent is without signs or symptoms of steal syndrome. Based on duplex today the fistula has matured nicely but is too deep in the left upper extremity. I have discussed recommendation for and need for elevation/ transposition of the fistula. He is not on HD yet and just had a radial nephrectomy. I discussed with him that the second stage can be scheduled when he has recovered further from his two recent surgeries. I explained though that it would delay the readiness of the fistula should he need to initiate dialysis. He would like to go ahead and proceed with surgery at this time.   He is on Plavix which will need to be held for 5 days  I will have him scheduled for second stage BC fistula transposition with Dr. Susanne Borders, PA-C [facility] [place] Office: [phone]  Clinic MD: Dr. Stanford Breed Dr. Carlis Abbott

## 2020-04-03 DIAGNOSIS — M109 Gout, unspecified: Secondary | ICD-10-CM | POA: Diagnosis not present

## 2020-04-03 DIAGNOSIS — E1129 Type 2 diabetes mellitus with other diabetic kidney complication: Secondary | ICD-10-CM | POA: Diagnosis not present

## 2020-04-03 DIAGNOSIS — Z125 Encounter for screening for malignant neoplasm of prostate: Secondary | ICD-10-CM | POA: Diagnosis not present

## 2020-04-03 DIAGNOSIS — E785 Hyperlipidemia, unspecified: Secondary | ICD-10-CM | POA: Diagnosis not present

## 2020-04-04 ENCOUNTER — Encounter (HOSPITAL_COMMUNITY): Payer: Self-pay | Admitting: Vascular Surgery

## 2020-04-04 ENCOUNTER — Other Ambulatory Visit: Payer: Self-pay

## 2020-04-04 ENCOUNTER — Other Ambulatory Visit
Admission: RE | Admit: 2020-04-04 | Discharge: 2020-04-04 | Disposition: A | Discharge status: Home / Self Care | Payer: Medicare Other | Source: Ambulatory Visit | Attending: Vascular Surgery | Admitting: Vascular Surgery

## 2020-04-04 DIAGNOSIS — Z01812 Encounter for preprocedural laboratory examination: Secondary | ICD-10-CM | POA: Diagnosis not present

## 2020-04-04 DIAGNOSIS — Z20822 Contact with and (suspected) exposure to covid-19: Secondary | ICD-10-CM | POA: Insufficient documentation

## 2020-04-04 LAB — SARS CORONAVIRUS 2 (TAT 6-24 HRS): SARS Coronavirus 2: NEGATIVE

## 2020-04-04 NOTE — Progress Notes (Signed)
Patient given pre-op instructions and verbalizes understanding.  Patient states there have been no changes to his medical history since he was here for surgery last.  Patient informed nurse that he will take 9 units of Tresiba the day before and the morning of his procedure.  Patient understands to check blood sugar the morning of and if blood sugar is less than 70, to drink 1/2 cup of clear juice and recheck.  Patient stated that fasting blood sugars range from 96-120.    Patient denies chest pain and shortness of breath.    Anesthesia review: no

## 2020-04-06 NOTE — Anesthesia Preprocedure Evaluation (Addendum)
Anesthesia Evaluation  Patient identified by MRN, date of birth, ID band Patient awake    Reviewed: Allergy & Precautions, NPO status , Patient's Chart, lab work & pertinent test results, reviewed documented beta blocker date and time   History of Anesthesia Complications Negative for: history of anesthetic complications  Airway Mallampati: II  TM Distance: >3 FB Neck ROM: Full    Dental  (+) Missing,    Pulmonary former smoker,    Pulmonary exam normal        Cardiovascular hypertension, Pt. on medications and Pt. on home beta blockers Normal cardiovascular exam     Neuro/Psych CVA negative psych ROS   GI/Hepatic Neg liver ROS, GERD  Controlled,  Endo/Other  diabetes, Type 2, Insulin Dependent  Renal/GU ESRFRenal disease  negative genitourinary   Musculoskeletal  (+) Arthritis ,   Abdominal   Peds  Hematology  (+) anemia ,   Anesthesia Other Findings Day of surgery medications reviewed with patient.  Reproductive/Obstetrics negative OB ROS                           Anesthesia Physical Anesthesia Plan  ASA: III  Anesthesia Plan: General   Post-op Pain Management:    Induction: Intravenous  PONV Risk Score and Plan: 2 and Treatment may vary due to age or medical condition and Ondansetron  Airway Management Planned: LMA  Additional Equipment: None  Intra-op Plan:   Post-operative Plan: Extubation in OR  Informed Consent: I have reviewed the patients History and Physical, chart, labs and discussed the procedure including the risks, benefits and alternatives for the proposed anesthesia with the patient or authorized representative who has indicated his/her understanding and acceptance.     Dental advisory given  Plan Discussed with: CRNA  Anesthesia Plan Comments:        Anesthesia Quick Evaluation

## 2020-04-07 ENCOUNTER — Other Ambulatory Visit: Payer: Self-pay

## 2020-04-07 ENCOUNTER — Ambulatory Visit (HOSPITAL_COMMUNITY): Payer: Medicare Other | Admitting: Anesthesiology

## 2020-04-07 ENCOUNTER — Encounter (HOSPITAL_COMMUNITY)
Admission: RE | Disposition: A | Discharge status: Home / Self Care | Payer: Self-pay | Source: Home / Self Care | Attending: Vascular Surgery

## 2020-04-07 ENCOUNTER — Encounter (HOSPITAL_COMMUNITY): Payer: Self-pay | Admitting: Vascular Surgery

## 2020-04-07 ENCOUNTER — Ambulatory Visit (HOSPITAL_COMMUNITY)
Admission: RE | Admit: 2020-04-07 | Discharge: 2020-04-07 | Disposition: A | Discharge status: Home / Self Care | Payer: Medicare Other | Attending: Vascular Surgery | Admitting: Vascular Surgery

## 2020-04-07 DIAGNOSIS — N185 Chronic kidney disease, stage 5: Secondary | ICD-10-CM | POA: Diagnosis not present

## 2020-04-07 DIAGNOSIS — N186 End stage renal disease: Secondary | ICD-10-CM | POA: Diagnosis not present

## 2020-04-07 DIAGNOSIS — E1122 Type 2 diabetes mellitus with diabetic chronic kidney disease: Secondary | ICD-10-CM | POA: Diagnosis not present

## 2020-04-07 DIAGNOSIS — C649 Malignant neoplasm of unspecified kidney, except renal pelvis: Secondary | ICD-10-CM | POA: Diagnosis not present

## 2020-04-07 DIAGNOSIS — Z905 Acquired absence of kidney: Secondary | ICD-10-CM | POA: Diagnosis not present

## 2020-04-07 DIAGNOSIS — I120 Hypertensive chronic kidney disease with stage 5 chronic kidney disease or end stage renal disease: Secondary | ICD-10-CM | POA: Diagnosis not present

## 2020-04-07 DIAGNOSIS — D631 Anemia in chronic kidney disease: Secondary | ICD-10-CM | POA: Diagnosis not present

## 2020-04-07 DIAGNOSIS — N1832 Chronic kidney disease, stage 3b: Secondary | ICD-10-CM

## 2020-04-07 DIAGNOSIS — N184 Chronic kidney disease, stage 4 (severe): Secondary | ICD-10-CM

## 2020-04-07 DIAGNOSIS — Z794 Long term (current) use of insulin: Secondary | ICD-10-CM | POA: Diagnosis not present

## 2020-04-07 HISTORY — PX: BASCILIC VEIN TRANSPOSITION: SHX5742

## 2020-04-07 LAB — POCT I-STAT, CHEM 8
BUN: 47 mg/dL — ABNORMAL HIGH (ref 8–23)
Calcium, Ion: 1.16 mmol/L (ref 1.15–1.40)
Chloride: 104 mmol/L (ref 98–111)
Creatinine, Ser: 3.8 mg/dL — ABNORMAL HIGH (ref 0.61–1.24)
Glucose, Bld: 68 mg/dL — ABNORMAL LOW (ref 70–99)
HCT: 29 % — ABNORMAL LOW (ref 39.0–52.0)
Hemoglobin: 9.9 g/dL — ABNORMAL LOW (ref 13.0–17.0)
Potassium: 4.3 mmol/L (ref 3.5–5.1)
Sodium: 140 mmol/L (ref 135–145)
TCO2: 26 mmol/L (ref 22–32)

## 2020-04-07 LAB — GLUCOSE, CAPILLARY
Glucose-Capillary: 126 mg/dL — ABNORMAL HIGH (ref 70–99)
Glucose-Capillary: 60 mg/dL — ABNORMAL LOW (ref 70–99)
Glucose-Capillary: 107 mg/dL — ABNORMAL HIGH (ref 70–99)
Glucose-Capillary: 115 mg/dL — ABNORMAL HIGH (ref 70–99)

## 2020-04-07 SURGERY — TRANSPOSITION, VEIN, BASILIC
Anesthesia: General | Site: Arm Upper | Laterality: Left

## 2020-04-07 MED ORDER — PHENYLEPHRINE HCL-NACL 10-0.9 MG/250ML-% IV SOLN
INTRAVENOUS | Status: DC | PRN
  Administered 2020-04-07: 100 ug/min via INTRAVENOUS

## 2020-04-07 MED ORDER — PHENYLEPHRINE 40 MCG/ML (10ML) SYRINGE FOR IV PUSH (FOR BLOOD PRESSURE SUPPORT)
PREFILLED_SYRINGE | INTRAVENOUS | Status: DC | PRN
  Administered 2020-04-07: 80 ug via INTRAVENOUS
  Administered 2020-04-07: 40 ug via INTRAVENOUS
  Administered 2020-04-07 (×2): 80 ug via INTRAVENOUS
  Administered 2020-04-07: 40 ug via INTRAVENOUS
  Administered 2020-04-07: 80 ug via INTRAVENOUS

## 2020-04-07 MED ORDER — FENTANYL CITRATE (PF) 250 MCG/5ML IJ SOLN
INTRAMUSCULAR | Status: DC | PRN
  Administered 2020-04-07: 50 ug via INTRAVENOUS
  Administered 2020-04-07: 25 ug via INTRAVENOUS

## 2020-04-07 MED ORDER — SODIUM CHLORIDE 0.9 % IV SOLN
INTRAVENOUS | Status: DC | PRN
  Administered 2020-04-07: 500 mL

## 2020-04-07 MED ORDER — PROTAMINE SULFATE 10 MG/ML IV SOLN
INTRAVENOUS | Status: AC
  Filled 2020-04-07: qty 5

## 2020-04-07 MED ORDER — SODIUM CHLORIDE 0.9 % IV SOLN
INTRAVENOUS | Status: AC
  Filled 2020-04-07: qty 1.2

## 2020-04-07 MED ORDER — FENTANYL CITRATE (PF) 250 MCG/5ML IJ SOLN
INTRAMUSCULAR | Status: AC
  Filled 2020-04-07: qty 5

## 2020-04-07 MED ORDER — PHENYLEPHRINE 40 MCG/ML (10ML) SYRINGE FOR IV PUSH (FOR BLOOD PRESSURE SUPPORT)
PREFILLED_SYRINGE | INTRAVENOUS | Status: AC
  Filled 2020-04-07: qty 10

## 2020-04-07 MED ORDER — PROTAMINE SULFATE 10 MG/ML IV SOLN
INTRAVENOUS | Status: DC | PRN
  Administered 2020-04-07: 30 mg via INTRAVENOUS

## 2020-04-07 MED ORDER — SODIUM CHLORIDE 0.9 % IV SOLN: INTRAVENOUS | Status: DC

## 2020-04-07 MED ORDER — PROPOFOL 10 MG/ML IV BOLUS
INTRAVENOUS | Status: DC | PRN
  Administered 2020-04-07: 30 mg via INTRAVENOUS
  Administered 2020-04-07: 110 mg via INTRAVENOUS

## 2020-04-07 MED ORDER — CHLORHEXIDINE GLUCONATE 4 % EX LIQD: 60.0000 mL | Freq: Once | CUTANEOUS | Status: DC

## 2020-04-07 MED ORDER — CHLORHEXIDINE GLUCONATE 0.12 % MT SOLN: 15.0000 mL | Freq: Once | OROMUCOSAL | Status: AC

## 2020-04-07 MED ORDER — HYDROCODONE-ACETAMINOPHEN 5-325 MG PO TABS: 1.0000 | ORAL_TABLET | ORAL | 0 refills | Status: DC | PRN

## 2020-04-07 MED ORDER — ONDANSETRON HCL 4 MG/2ML IJ SOLN
INTRAMUSCULAR | Status: DC | PRN
  Administered 2020-04-07: 4 mg via INTRAVENOUS

## 2020-04-07 MED ORDER — LIDOCAINE 2% (20 MG/ML) 5 ML SYRINGE
INTRAMUSCULAR | Status: DC | PRN
  Administered 2020-04-07: 100 mg via INTRAVENOUS

## 2020-04-07 MED ORDER — CARVEDILOL 12.5 MG PO TABS
ORAL_TABLET | ORAL | Status: AC
  Filled 2020-04-07: qty 1

## 2020-04-07 MED ORDER — DEXTROSE 50 % IV SOLN
25.0000 mL | Freq: Once | INTRAVENOUS | Status: AC
  Filled 2020-04-07: qty 50

## 2020-04-07 MED ORDER — ORAL CARE MOUTH RINSE: 15.0000 mL | Freq: Once | OROMUCOSAL | Status: AC

## 2020-04-07 MED ORDER — EPHEDRINE SULFATE-NACL 50-0.9 MG/10ML-% IV SOSY
PREFILLED_SYRINGE | INTRAVENOUS | Status: DC | PRN
  Administered 2020-04-07: 5 mg via INTRAVENOUS
  Administered 2020-04-07: 10 mg via INTRAVENOUS
  Administered 2020-04-07: 5 mg via INTRAVENOUS
  Administered 2020-04-07 (×2): 10 mg via INTRAVENOUS
  Administered 2020-04-07 (×2): 5 mg via INTRAVENOUS

## 2020-04-07 MED ORDER — LIDOCAINE-EPINEPHRINE 1 %-1:100000 IJ SOLN
INTRAMUSCULAR | Status: AC
  Filled 2020-04-07: qty 1

## 2020-04-07 MED ORDER — FENTANYL CITRATE (PF) 100 MCG/2ML IJ SOLN: 25.0000 ug | INTRAMUSCULAR | Status: DC | PRN

## 2020-04-07 MED ORDER — 0.9 % SODIUM CHLORIDE (POUR BTL) OPTIME
TOPICAL | Status: DC | PRN
  Administered 2020-04-07: 1000 mL

## 2020-04-07 MED ORDER — HEPARIN SODIUM (PORCINE) 1000 UNIT/ML IJ SOLN
INTRAMUSCULAR | Status: AC
  Filled 2020-04-07: qty 1

## 2020-04-07 MED ORDER — PHENYLEPHRINE HCL (PRESSORS) 10 MG/ML IV SOLN
INTRAVENOUS | Status: AC
  Filled 2020-04-07: qty 1

## 2020-04-07 MED ORDER — ONDANSETRON HCL 4 MG/2ML IJ SOLN
INTRAMUSCULAR | Status: AC
  Filled 2020-04-07: qty 2

## 2020-04-07 MED ORDER — CARVEDILOL 25 MG PO TABS
25.0000 mg | ORAL_TABLET | Freq: Once | ORAL | Status: AC
  Administered 2020-04-07: 25 mg via ORAL
  Filled 2020-04-07: qty 1

## 2020-04-07 MED ORDER — HEPARIN SODIUM (PORCINE) 1000 UNIT/ML IJ SOLN
INTRAMUSCULAR | Status: DC | PRN
  Administered 2020-04-07: 5000 [IU] via INTRAVENOUS

## 2020-04-07 MED ORDER — CEFAZOLIN SODIUM-DEXTROSE 2-4 GM/100ML-% IV SOLN
2.0000 g | INTRAVENOUS | Status: AC
  Administered 2020-04-07: 2 g via INTRAVENOUS
  Filled 2020-04-07: qty 100

## 2020-04-07 MED ORDER — CHLORHEXIDINE GLUCONATE 0.12 % MT SOLN
OROMUCOSAL | Status: AC
  Administered 2020-04-07: 15 mL via OROMUCOSAL
  Filled 2020-04-07: qty 15

## 2020-04-07 MED ORDER — EPHEDRINE 5 MG/ML INJ
INTRAVENOUS | Status: AC
  Filled 2020-04-07: qty 10

## 2020-04-07 MED ORDER — DEXTROSE 50 % IV SOLN
INTRAVENOUS | Status: AC
  Administered 2020-04-07: 25 mL via INTRAVENOUS
  Filled 2020-04-07: qty 50

## 2020-04-07 MED ORDER — PROPOFOL 10 MG/ML IV BOLUS
INTRAVENOUS | Status: AC
  Filled 2020-04-07: qty 20

## 2020-04-07 MED ORDER — CHLORHEXIDINE GLUCONATE 4 % EX LIQD
60.0000 mL | Freq: Once | CUTANEOUS | Status: DC
Start: 2020-04-08 — End: 2020-04-07

## 2020-04-07 SURGICAL SUPPLY — 41 items
ARMBAND PINK RESTRICT EXTREMIT (MISCELLANEOUS) ×3 IMPLANT
BENZOIN TINCTURE PRP APPL 2/3 (GAUZE/BANDAGES/DRESSINGS) ×3 IMPLANT
BNDG ELASTIC 4X5.8 VLCR STR LF (GAUZE/BANDAGES/DRESSINGS) ×3 IMPLANT
CANISTER SUCT 3000ML PPV (MISCELLANEOUS) ×3 IMPLANT
CANNULA VESSEL 3MM 2 BLNT TIP (CANNULA) ×3 IMPLANT
CHLORAPREP W/TINT 26 (MISCELLANEOUS) ×3 IMPLANT
CLIP VESOCCLUDE MED 6/CT (CLIP) ×3 IMPLANT
CLIP VESOCCLUDE SM WIDE 6/CT (CLIP) ×3 IMPLANT
CLOSURE STERI-STRIP 1/4X4 (GAUZE/BANDAGES/DRESSINGS) ×3 IMPLANT
CLOSURE WOUND 1/2 X4 (GAUZE/BANDAGES/DRESSINGS) ×1
COVER PROBE W GEL 5X96 (DRAPES) ×3 IMPLANT
ELECT REM PT RETURN 9FT ADLT (ELECTROSURGICAL) ×3
ELECTRODE REM PT RTRN 9FT ADLT (ELECTROSURGICAL) ×1 IMPLANT
GAUZE 4X4 16PLY RFD (DISPOSABLE) ×9 IMPLANT
GAUZE SPONGE 4X4 12PLY STRL (GAUZE/BANDAGES/DRESSINGS) ×3 IMPLANT
GLOVE SURG POLYISO LF SZ6.5 (GLOVE) ×3 IMPLANT
GLOVE SURG SYN 8.0 (GLOVE) ×3 IMPLANT
GLOVE SURG UNDER POLY LF SZ6.5 (GLOVE) ×3 IMPLANT
GLOVE SURG UNDER POLY LF SZ7 (GLOVE) ×3 IMPLANT
GLOVE SURG UNDER POLY LF SZ7.5 (GLOVE) ×3 IMPLANT
GOWN STRL REUS W/ TWL LRG LVL3 (GOWN DISPOSABLE) ×2 IMPLANT
GOWN STRL REUS W/ TWL XL LVL3 (GOWN DISPOSABLE) ×1 IMPLANT
GOWN STRL REUS W/TWL LRG LVL3 (GOWN DISPOSABLE) ×4
GOWN STRL REUS W/TWL XL LVL3 (GOWN DISPOSABLE) ×2
HEMOSTAT SNOW SURGICEL 2X4 (HEMOSTASIS) ×3 IMPLANT
INSERT FOGARTY SM (MISCELLANEOUS) IMPLANT
KIT BASIN OR (CUSTOM PROCEDURE TRAY) ×3 IMPLANT
KIT TURNOVER KIT B (KITS) ×3 IMPLANT
NS IRRIG 1000ML POUR BTL (IV SOLUTION) ×3 IMPLANT
PACK CV ACCESS (CUSTOM PROCEDURE TRAY) ×3 IMPLANT
PAD ARMBOARD 7.5X6 YLW CONV (MISCELLANEOUS) ×6 IMPLANT
STRIP CLOSURE SKIN 1/2X4 (GAUZE/BANDAGES/DRESSINGS) ×2 IMPLANT
SUT GORETEX 6.0 TT9 (SUTURE) ×3 IMPLANT
SUT MNCRL AB 4-0 PS2 18 (SUTURE) IMPLANT
SUT PROLENE 6 0 BV (SUTURE) ×12 IMPLANT
SUT SILK 2 0 SH (SUTURE) ×6 IMPLANT
SUT VIC AB 3-0 SH 27 (SUTURE) ×6
SUT VIC AB 3-0 SH 27X BRD (SUTURE) ×3 IMPLANT
TOWEL GREEN STERILE (TOWEL DISPOSABLE) ×3 IMPLANT
UNDERPAD 30X36 HEAVY ABSORB (UNDERPADS AND DIAPERS) ×3 IMPLANT
WATER STERILE IRR 1000ML POUR (IV SOLUTION) ×3 IMPLANT

## 2020-04-07 NOTE — Discharge Instructions (Signed)
[facility]  Discharge Instructions  AV Fistula or Graft Surgery for Dialysis Access  Please refer to the following instructions for your post-procedure care. Your surgeon or physician assistant will discuss any changes with you.  Activity  You may drive the day following your surgery, if you are comfortable and no longer taking prescription pain medication. Resume full activity as the soreness in your incision resolves.  Bathing/Showering  You may shower after you go home. Keep your incision dry for 48 hours. Do not soak in a bathtub, hot tub, or swim until the incision heals completely. You may not shower if you have a hemodialysis catheter.  Incision Care  Clean your incision with mild soap and water after 48 hours. Pat the area dry with a clean towel. You do not need a bandage unless otherwise instructed. Do not apply any ointments or creams to your incision. You may have skin glue on your incision. Do not peel it off. It will come off on its own in about one week. Your arm may swell a bit after surgery. To reduce swelling use pillows to elevate your arm so it is above your heart. Your doctor will tell you if you need to lightly wrap your arm with an ACE bandage.  Diet  Resume your normal diet. There are not special food restrictions following this procedure. In order to heal from your surgery, it is CRITICAL to get adequate nutrition. Your body requires vitamins, minerals, and protein. Vegetables are the best source of vitamins and minerals. Vegetables also provide the perfect balance of protein. Processed food has little nutritional value, so try to avoid this.  Medications  Resume taking all of your medications. If your incision is causing pain, you may take over-the counter pain relievers such as acetaminophen (Tylenol). If you were prescribed a stronger pain medication, please be aware these medications can cause nausea and constipation. Prevent  nausea by taking the medication with a snack or meal. Avoid constipation by drinking plenty of fluids and eating foods with high amount of fiber, such as fruits, vegetables, and grains.  Do not take Tylenol if you are taking prescription pain medications.  Follow up Your surgeon may want to see you in the office following your access surgery. If so, this will be arranged at the time of your surgery.  Please call us immediately for any of the following conditions:  . Increased pain, redness, drainage (pus) from your incision site . Fever of 101 degrees or higher . Severe or worsening pain at your incision site . Hand pain or numbness. .  Reduce your risk of vascular disease:  . Stop smoking. If you would like help, call [facility] at 1-800-QUIT-NOW 343-078-3121) or [place] at [phone]  . Manage your cholesterol . Maintain a desired weight . Control your diabetes . Keep your blood pressure down  Dialysis  It will take several weeks to several months for your new dialysis access to be ready for use. Your surgeon will determine when it is okay to use it. Your nephrologist will continue to direct your dialysis. You can continue to use your Permcath until your new access is ready for use.   04/07/2020 Jason Nielsen [id number] 1940/08/25  Surgeon(s): Cherre Robins, MD  Procedure(s): LEFT ARM SECOND STAGE BRACHIOCEPHALIC ARTERIOVENOUS FISTULA PLACEMENT   May stick graft immediately   May stick graft on designated area only:   X Do not stick AV fistula  for 4 weeks  If you have any questions, please call the office at [phone].

## 2020-04-07 NOTE — Anesthesia Postprocedure Evaluation (Signed)
Anesthesia Post Note  Patient: Jason Nielsen  Procedure(s) Performed: LEFT ARM SECOND STAGE BRACHIOCEPHALIC ARTERIOVENOUS FISTULA PLACEMENT (Left Arm Upper)     Patient location during evaluation: PACU Anesthesia Type: General Level of consciousness: awake and alert and oriented Pain management: pain level controlled Vital Signs Assessment: post-procedure vital signs reviewed and stable Respiratory status: spontaneous breathing, nonlabored ventilation and respiratory function stable Cardiovascular status: blood pressure returned to baseline Postop Assessment: no apparent nausea or vomiting Anesthetic complications: no   No complications documented.  Last Vitals:  Vitals:   04/07/20 1040 04/07/20 1055  BP: 122/62 122/61  Pulse: (!) 49 (!) 48  Resp: 12 12  Temp:  (!) 36.3 C  SpO2: 96% 96%    Last Pain:  Vitals:   04/07/20 1055  TempSrc:   PainSc: 0-No pain                 Brennan Bailey

## 2020-04-07 NOTE — Op Note (Signed)
DATE OF SERVICE: 04/07/2020  PATIENT:  Jason Nielsen  80 y.o. 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  ANESTHESIA:   general  EBL: min  BLOOD ADMINISTERED:none  DRAINS: none   LOCAL MEDICATIONS USED:  NONE  SPECIMEN:  none  COUNTS: confirmed correct.  TOURNIQUET:  none  PATIENT DISPOSITION:  PACU - hemodynamically stable.   Delay start of Pharmacological VTE agent (>24hrs) due to surgical blood loss or risk of bleeding: no  INDICATION FOR PROCEDURE: Jason Nielsen is a 80 y.o. male with CKD nearing ESRD. He had previously undergone left first stage brachiobasilic arteriovenous fistula creation. After careful discussion of risks, benefits, and alternatives the patient was offered second stage basilic vein transposition. The patient understood and wished to proceed.  OPERATIVE FINDINGS: matured brachiobasilic AVF suitable for transposition.  During my first tunneling attempt, the tunnel was too lateral in the arm, and the fistula twisted on delivery.  We retunneled more medially.  At completion there was a strong thrill throughout the course of the fistula.  DESCRIPTION OF PROCEDURE: After identification of the patient in the pre-operative holding area, the patient was transferred to the operating room. The patient was positioned supine on the operating room table. Anesthesia was induced. The left arm was prepped and draped in standard fashion. A surgical pause was performed confirming correct patient, procedure, and operative location.  Using intraoperative ultrasound the course of the left basilic vein was marked on the skin.  3 skip incisions were made over the  course of the basilic vein fistula.  These were carried down through subcutaneous tissue until the fistula was encountered.  The fascia was skeletonized from the axilla to the anastomosis, taking care to ligate and divide sidebranches, and to protect the medial antebrachial cutaneous nerve.   A subcutaneous tunnel was created over the biceps using a sheath tunneling device.  Patient was heparinized.  The fistula near the anastomosis was clamped.  The outflow in the axilla was clamped.  The fistula was divided.  The fistula was marked to ensure no twisting or kinking while delivering the fistula through the tunnel.  The fistula was tunneled through the arm and delivered near the previous anastomosis.  The fistula was spatulated proximally and distally.  The fistula was reanastomosed end-to-end using continuous running suture of 5-0 Prolene.  A palpable thrill was felt over the subcutaneous course of the tunnel.  Doppler flow was excellent in the proximal and distal fistula.  The wounds were copiously irrigated.  Hemostasis was ensured in the surgical bed.  The wounds were closed in layers using 3-0 Vicryl and 4-0 Monocryl.  Upon completion of the case instrument and sharps counts were confirmed correct. The patient was transferred to the PACU in good condition. I was present for all portions of the procedure.  Yevonne Aline. Stanford Breed, MD [facility] Phone Number: [phone] 04/07/2020 10:09 AM

## 2020-04-07 NOTE — Transfer of Care (Signed)
Immediate Anesthesia Transfer of Care Note  Patient: Jason Nielsen  Procedure(s) Performed: LEFT ARM SECOND STAGE BRACHIOCEPHALIC ARTERIOVENOUS FISTULA PLACEMENT (Left Arm Upper)  Patient Location: PACU  Anesthesia Type:General  Level of Consciousness: awake, patient cooperative and responds to stimulation  Airway & Oxygen Therapy: Patient Spontanous Breathing and Patient connected to face mask oxygen  Post-op Assessment: Report given to RN and Post -op Vital signs reviewed and stable  Post vital signs: Reviewed and stable  Last Vitals:  Vitals Value Taken Time  BP 116/62 04/07/20 1023  Temp    Pulse 49 04/07/20 1028  Resp 16 04/07/20 1028  SpO2 100 % 04/07/20 1028  Vitals shown include unvalidated device data.  Last Pain:  Vitals:   04/07/20 0630  TempSrc:   PainSc: 0-No pain      Patients Stated Pain Goal: 3 (93/[id number])  Complications: No complications documented.

## 2020-04-07 NOTE — Anesthesia Procedure Notes (Signed)
Procedure Name: LMA Insertion Date/Time: 04/07/2020 7:35 AM Performed by: Michele Rockers, CRNA Pre-anesthesia Checklist: Patient identified, Patient being monitored, Timeout performed, Emergency Drugs available and Suction available Patient Re-evaluated:Patient Re-evaluated prior to induction Oxygen Delivery Method: Circle System Utilized Preoxygenation: Pre-oxygenation with 100% oxygen Induction Type: IV induction Ventilation: Mask ventilation without difficulty LMA Size: 5.0 Number of attempts: 1 Placement Confirmation: positive ETCO2 and breath sounds checked- equal and bilateral Tube secured with: Tape Dental Injury: Teeth and Oropharynx as per pre-operative assessment

## 2020-04-07 NOTE — Interval H&P Note (Signed)
History and Physical Interval Note:  04/07/2020 7:17 AM  Jason Nielsen  has presented today for surgery, with the diagnosis of ESRD.  The various methods of treatment have been discussed with the patient and family. After consideration of risks, benefits and other options for treatment, the patient has consented to  Procedure(s): LEFT ARM Bent (Left) as a surgical intervention.  The patient's history has been reviewed, patient examined, no change in status, stable for surgery.  I have reviewed the patient's chart and labs.  Questions were answered to the patient's satisfaction.     Cherre Robins

## 2020-04-08 ENCOUNTER — Encounter (HOSPITAL_COMMUNITY): Payer: Self-pay | Admitting: Vascular Surgery

## 2020-04-10 DIAGNOSIS — E1129 Type 2 diabetes mellitus with other diabetic kidney complication: Secondary | ICD-10-CM | POA: Diagnosis not present

## 2020-04-10 DIAGNOSIS — C641 Malignant neoplasm of right kidney, except renal pelvis: Secondary | ICD-10-CM | POA: Diagnosis not present

## 2020-04-10 DIAGNOSIS — Z794 Long term (current) use of insulin: Secondary | ICD-10-CM | POA: Diagnosis not present

## 2020-04-10 DIAGNOSIS — R82998 Other abnormal findings in urine: Secondary | ICD-10-CM | POA: Diagnosis not present

## 2020-04-10 DIAGNOSIS — I10 Essential (primary) hypertension: Secondary | ICD-10-CM | POA: Diagnosis not present

## 2020-04-10 DIAGNOSIS — E1139 Type 2 diabetes mellitus with other diabetic ophthalmic complication: Secondary | ICD-10-CM | POA: Diagnosis not present

## 2020-04-10 DIAGNOSIS — Z0000 Encounter for general adult medical examination without abnormal findings: Secondary | ICD-10-CM | POA: Diagnosis not present

## 2020-04-10 DIAGNOSIS — I69954 Hemiplegia and hemiparesis following unspecified cerebrovascular disease affecting left non-dominant side: Secondary | ICD-10-CM | POA: Diagnosis not present

## 2020-04-10 DIAGNOSIS — E785 Hyperlipidemia, unspecified: Secondary | ICD-10-CM | POA: Diagnosis not present

## 2020-04-10 DIAGNOSIS — I129 Hypertensive chronic kidney disease with stage 1 through stage 4 chronic kidney disease, or unspecified chronic kidney disease: Secondary | ICD-10-CM | POA: Diagnosis not present

## 2020-04-10 DIAGNOSIS — D649 Anemia, unspecified: Secondary | ICD-10-CM | POA: Diagnosis not present

## 2020-04-10 DIAGNOSIS — Z1339 Encounter for screening examination for other mental health and behavioral disorders: Secondary | ICD-10-CM | POA: Diagnosis not present

## 2020-04-10 DIAGNOSIS — N184 Chronic kidney disease, stage 4 (severe): Secondary | ICD-10-CM | POA: Diagnosis not present

## 2020-04-10 DIAGNOSIS — Z1331 Encounter for screening for depression: Secondary | ICD-10-CM | POA: Diagnosis not present

## 2020-04-10 DIAGNOSIS — I770 Arteriovenous fistula, acquired: Secondary | ICD-10-CM | POA: Diagnosis not present

## 2020-04-28 ENCOUNTER — Other Ambulatory Visit: Payer: Self-pay

## 2020-04-28 DIAGNOSIS — N184 Chronic kidney disease, stage 4 (severe): Secondary | ICD-10-CM

## 2020-04-30 DIAGNOSIS — N2581 Secondary hyperparathyroidism of renal origin: Secondary | ICD-10-CM | POA: Diagnosis not present

## 2020-04-30 DIAGNOSIS — I129 Hypertensive chronic kidney disease with stage 1 through stage 4 chronic kidney disease, or unspecified chronic kidney disease: Secondary | ICD-10-CM | POA: Diagnosis not present

## 2020-04-30 DIAGNOSIS — N2889 Other specified disorders of kidney and ureter: Secondary | ICD-10-CM | POA: Diagnosis not present

## 2020-04-30 DIAGNOSIS — N184 Chronic kidney disease, stage 4 (severe): Secondary | ICD-10-CM | POA: Diagnosis not present

## 2020-04-30 DIAGNOSIS — Z905 Acquired absence of kidney: Secondary | ICD-10-CM | POA: Diagnosis not present

## 2020-04-30 DIAGNOSIS — R609 Edema, unspecified: Secondary | ICD-10-CM | POA: Diagnosis not present

## 2020-04-30 DIAGNOSIS — I770 Arteriovenous fistula, acquired: Secondary | ICD-10-CM | POA: Diagnosis not present

## 2020-04-30 DIAGNOSIS — D631 Anemia in chronic kidney disease: Secondary | ICD-10-CM | POA: Diagnosis not present

## 2020-05-07 ENCOUNTER — Ambulatory Visit (INDEPENDENT_AMBULATORY_CARE_PROVIDER_SITE_OTHER): Payer: Medicare Other | Admitting: Physician Assistant

## 2020-05-07 ENCOUNTER — Other Ambulatory Visit: Payer: Self-pay

## 2020-05-07 ENCOUNTER — Ambulatory Visit (HOSPITAL_COMMUNITY)
Admission: RE | Admit: 2020-05-07 | Discharge: 2020-05-07 | Disposition: A | Discharge status: Home / Self Care | Payer: Medicare Other | Source: Ambulatory Visit | Attending: Vascular Surgery | Admitting: Vascular Surgery

## 2020-05-07 VITALS — BP 141/64 | HR 70 | Temp 98.0°F | Resp 20 | Ht 64.0 in | Wt 162.2 lb

## 2020-05-07 DIAGNOSIS — N184 Chronic kidney disease, stage 4 (severe): Secondary | ICD-10-CM

## 2020-05-07 NOTE — Progress Notes (Signed)
POST OPERATIVE OFFICE NOTE    CC:  F/u for surgery  HPI:  This is a 80 y.o. male who is s/p 2nd stage left BVT  on 04/07/2020 by Dr. Stanford Breed.   The first stage BVT was 02/07/2020 also by Dr. Stanford Breed.  Pt states he does not have pain/numbness in the left hand.   He states he has occasional numbness in the forearm but this has gotten better.   The pt is not on dialysis.  His nephrologist is Dr. Candiss Norse in [place].    Allergies  Allergen Reactions  . Ezetimibe     Other reaction(s): muscle weakness  . Metformin Hcl     Other reaction(s): diarrhea  . Pioglitazone     Other reaction(s): swelling    Current Outpatient Medications  Medication Sig Dispense Refill  . allopurinol (ZYLOPRIM) 100 MG tablet Take 100 mg by mouth daily.    Marland Kitchen amLODipine (NORVASC) 10 MG tablet Take 10 mg by mouth daily.    Marland Kitchen atorvastatin (LIPITOR) 40 MG tablet Take 40 mg by mouth daily.    . BD PEN NEEDLE NANO 2ND GEN 32G X 4 MM MISC USE WITH INSULIN DAILY    . carvedilol (COREG) 25 MG tablet Take 25 mg by mouth 2 (two) times daily.    . clopidogrel (PLAVIX) 75 MG tablet Take 75 mg by mouth daily.    Marland Kitchen docusate sodium (COLACE) 100 MG capsule Take 1 capsule (100 mg total) by mouth 2 (two) times daily. 60 capsule 2  . ferrous sulfate 325 (65 FE) MG tablet Take 325 mg by mouth daily.    Marland Kitchen HYDROcodone-acetaminophen (NORCO/VICODIN) 5-325 MG tablet Take 1 tablet by mouth every 4 (four) hours as needed for moderate pain. 20 tablet 0  . OneTouch Delica Lancets 50K MISC USE LANCET TO CHECK BLOOD GLUCOSE ONCE A DAY DX: E11.29    . ONETOUCH VERIO test strip daily.    . sodium bicarbonate 650 MG tablet Take 1,300 mg by mouth 3 (three) times daily.    Tyler Aas FLEXTOUCH 200 UNIT/ML FlexTouch Pen Inject 18 Units into the skin daily.    . valsartan (DIOVAN) 320 MG tablet Take 320 mg by mouth daily.     No current facility-administered medications for this visit.     ROS:  See HPI  Physical Exam:  Today's Vitals    05/07/20 1136  BP: (!) 141/64  Pulse: 70  Resp: 20  Temp: 98 F (36.7 C)  TempSrc: Temporal  SpO2: 100%  Weight: 162 lb 3.2 oz (73.6 kg)  Height: 5\' 4"  (1.626 m)   Body mass index is 27.84 kg/m.   Incision:  All are well healed. Extremities:   There is a palpable left radial pulse.   Motor and sensory are in tact.   There is a thrill/bruit present.  The fistula/graft is easily palpable   Dialysis Duplex on 05/07/2020: +------------+----------+-------------+----------+--------+  OUTFLOW VEINPSV (cm/s)Diameter (cm)Depth (cm)Describe  +------------+----------+-------------+----------+--------+  Prox UA     419    0.36     0.64        +------------+----------+-------------+----------+--------+  Mid UA     165    0.61     0.57        +------------+----------+-------------+----------+--------+  Dist UA     235    0.49     0.33        +------------+----------+-------------+----------+--------+  AC Fossa    632    0.77     1.03        +------------+----------+-------------+----------+--------+  Assessment/Plan:  This is a 80 y.o. male who is s/p: 2nd stage left BVT on 04/07/2020 by Dr. Stanford Breed  -the pt does not have evidence of steal. -the fistula has an excellent thrill and is easily palpable.  His study today shows a decrease in vein diameter compared to after 1st stage BVT.  He is not yet on HD.  Will bring pt back in 6 weeks to look at fistula to make sure it has matured.  Pt is in agreement with this plan.   Leontine Locket, [facility] [phone]  Clinic MD:  Scot Dock

## 2020-05-08 ENCOUNTER — Other Ambulatory Visit: Payer: Self-pay

## 2020-05-08 DIAGNOSIS — N184 Chronic kidney disease, stage 4 (severe): Secondary | ICD-10-CM

## 2020-05-12 DIAGNOSIS — N184 Chronic kidney disease, stage 4 (severe): Secondary | ICD-10-CM | POA: Diagnosis not present

## 2020-05-16 DIAGNOSIS — C641 Malignant neoplasm of right kidney, except renal pelvis: Secondary | ICD-10-CM | POA: Diagnosis not present

## 2020-05-22 DIAGNOSIS — M533 Sacrococcygeal disorders, not elsewhere classified: Secondary | ICD-10-CM | POA: Diagnosis not present

## 2020-05-22 DIAGNOSIS — M47816 Spondylosis without myelopathy or radiculopathy, lumbar region: Secondary | ICD-10-CM | POA: Diagnosis not present

## 2020-05-22 DIAGNOSIS — E278 Other specified disorders of adrenal gland: Secondary | ICD-10-CM | POA: Diagnosis not present

## 2020-05-22 DIAGNOSIS — C641 Malignant neoplasm of right kidney, except renal pelvis: Secondary | ICD-10-CM | POA: Diagnosis not present

## 2020-05-22 DIAGNOSIS — K5732 Diverticulitis of large intestine without perforation or abscess without bleeding: Secondary | ICD-10-CM | POA: Diagnosis not present

## 2020-05-22 DIAGNOSIS — J984 Other disorders of lung: Secondary | ICD-10-CM | POA: Diagnosis not present

## 2020-05-22 DIAGNOSIS — I2510 Atherosclerotic heart disease of native coronary artery without angina pectoris: Secondary | ICD-10-CM | POA: Diagnosis not present

## 2020-05-22 DIAGNOSIS — N2889 Other specified disorders of kidney and ureter: Secondary | ICD-10-CM | POA: Diagnosis not present

## 2020-05-23 DIAGNOSIS — C641 Malignant neoplasm of right kidney, except renal pelvis: Secondary | ICD-10-CM | POA: Diagnosis not present

## 2020-05-23 DIAGNOSIS — C61 Malignant neoplasm of prostate: Secondary | ICD-10-CM | POA: Diagnosis not present

## 2020-05-27 ENCOUNTER — Other Ambulatory Visit: Payer: Self-pay | Admitting: Urology

## 2020-05-27 DIAGNOSIS — C649 Malignant neoplasm of unspecified kidney, except renal pelvis: Secondary | ICD-10-CM

## 2020-06-03 ENCOUNTER — Other Ambulatory Visit (HOSPITAL_COMMUNITY): Payer: Self-pay | Admitting: *Deleted

## 2020-06-03 NOTE — Discharge Instructions (Signed)
Ferumoxytol injection What is this medicine? FERUMOXYTOL is an iron complex. Iron is used to make healthy red blood cells, which carry oxygen and nutrients throughout the body. This medicine is used to treat iron deficiency anemia. This medicine may be used for other purposes; ask your health care provider or pharmacist if you have questions. COMMON BRAND NAME(S): Feraheme What should I tell my health care provider before I take this medicine? They need to know if you have any of these conditions:  anemia not caused by low iron levels  high levels of iron in the blood  magnetic resonance imaging (MRI) test scheduled  an unusual or allergic reaction to iron, other medicines, foods, dyes, or preservatives  pregnant or trying to get pregnant  breast-feeding How should I use this medicine? This medicine is for injection into a vein. It is given by a health care professional in a hospital or clinic setting. Talk to your pediatrician regarding the use of this medicine in children. Special care may be needed. Overdosage: If you think you have taken too much of this medicine contact a poison control center or emergency room at once. NOTE: This medicine is only for you. Do not share this medicine with others. What if I miss a dose? It is important not to miss your dose. Call your doctor or health care professional if you are unable to keep an appointment. What may interact with this medicine? This medicine may interact with the following medications:  other iron products This list may not describe all possible interactions. Give your health care provider a list of all the medicines, herbs, non-prescription drugs, or dietary supplements you use. Also tell them if you smoke, drink alcohol, or use illegal drugs. Some items may interact with your medicine. What should I watch for while using this medicine? Visit your doctor or healthcare professional regularly. Tell your doctor or healthcare  professional if your symptoms do not start to get better or if they get worse. You may need blood work done while you are taking this medicine. You may need to follow a special diet. Talk to your doctor. Foods that contain iron include: whole grains/cereals, dried fruits, beans, or peas, leafy green vegetables, and organ meats (liver, kidney). What side effects may I notice from receiving this medicine? Side effects that you should report to your doctor or health care professional as soon as possible:  allergic reactions like skin rash, itching or hives, swelling of the face, lips, or tongue  breathing problems  changes in blood pressure  feeling faint or lightheaded, falls  fever or chills  flushing, sweating, or hot feelings  swelling of the ankles or feet Side effects that usually do not require medical attention (report to your doctor or health care professional if they continue or are bothersome):  diarrhea  headache  nausea, vomiting  stomach pain This list may not describe all possible side effects. Call your doctor for medical advice about side effects. You may report side effects to FDA at 1-800-FDA-1088. Where should I keep my medicine? This drug is given in a hospital or clinic and will not be stored at home. NOTE: This sheet is a summary. It may not cover all possible information. If you have questions about this medicine, talk to your doctor, pharmacist, or health care provider.  2021 Elsevier/Gold Standard (2016-02-09 20:21:10)  

## 2020-06-04 ENCOUNTER — Ambulatory Visit (HOSPITAL_COMMUNITY)
Admission: RE | Admit: 2020-06-04 | Discharge: 2020-06-04 | Disposition: A | Discharge status: Home / Self Care | Payer: Medicare Other | Source: Ambulatory Visit | Attending: Nephrology | Admitting: Nephrology

## 2020-06-04 ENCOUNTER — Other Ambulatory Visit: Payer: Self-pay

## 2020-06-04 DIAGNOSIS — D631 Anemia in chronic kidney disease: Secondary | ICD-10-CM | POA: Diagnosis not present

## 2020-06-04 MED ORDER — SODIUM CHLORIDE 0.9 % IV SOLN
510.0000 mg | INTRAVENOUS | Status: DC
  Administered 2020-06-04: 510 mg via INTRAVENOUS
  Filled 2020-06-04: qty 17

## 2020-06-11 ENCOUNTER — Encounter (HOSPITAL_COMMUNITY)
Admission: RE | Admit: 2020-06-11 | Discharge: 2020-06-11 | Disposition: A | Discharge status: Home / Self Care | Payer: Medicare Other | Source: Ambulatory Visit | Attending: Nephrology | Admitting: Nephrology

## 2020-06-11 ENCOUNTER — Other Ambulatory Visit: Payer: Self-pay

## 2020-06-11 DIAGNOSIS — D631 Anemia in chronic kidney disease: Secondary | ICD-10-CM | POA: Diagnosis not present

## 2020-06-11 MED ORDER — SODIUM CHLORIDE 0.9 % IV SOLN
510.0000 mg | INTRAVENOUS | Status: DC
  Administered 2020-06-11: 510 mg via INTRAVENOUS
  Filled 2020-06-11: qty 510

## 2020-06-13 ENCOUNTER — Ambulatory Visit
Admission: RE | Admit: 2020-06-13 | Discharge: 2020-06-13 | Disposition: A | Discharge status: Home / Self Care | Payer: Medicare Other | Source: Ambulatory Visit | Attending: Urology | Admitting: Urology

## 2020-06-13 ENCOUNTER — Other Ambulatory Visit: Payer: Self-pay

## 2020-06-13 DIAGNOSIS — D492 Neoplasm of unspecified behavior of bone, soft tissue, and skin: Secondary | ICD-10-CM | POA: Diagnosis not present

## 2020-06-13 DIAGNOSIS — C649 Malignant neoplasm of unspecified kidney, except renal pelvis: Secondary | ICD-10-CM

## 2020-06-13 DIAGNOSIS — Z905 Acquired absence of kidney: Secondary | ICD-10-CM | POA: Diagnosis not present

## 2020-06-13 DIAGNOSIS — C641 Malignant neoplasm of right kidney, except renal pelvis: Secondary | ICD-10-CM | POA: Diagnosis not present

## 2020-06-13 MED ORDER — GADOBENATE DIMEGLUMINE 529 MG/ML IV SOLN
15.0000 mL | Freq: Once | INTRAVENOUS | Status: AC | PRN
  Administered 2020-06-13: 15 mL via INTRAVENOUS

## 2020-06-24 ENCOUNTER — Encounter: Payer: Self-pay | Admitting: Vascular Surgery

## 2020-06-24 ENCOUNTER — Ambulatory Visit (HOSPITAL_COMMUNITY)
Admission: RE | Admit: 2020-06-24 | Discharge: 2020-06-24 | Disposition: A | Discharge status: Home / Self Care | Payer: Medicare Other | Source: Ambulatory Visit | Attending: Vascular Surgery | Admitting: Vascular Surgery

## 2020-06-24 ENCOUNTER — Ambulatory Visit (INDEPENDENT_AMBULATORY_CARE_PROVIDER_SITE_OTHER): Payer: Medicare Other | Admitting: Vascular Surgery

## 2020-06-24 ENCOUNTER — Other Ambulatory Visit: Payer: Self-pay

## 2020-06-24 VITALS — BP 140/70 | HR 74 | Temp 99.0°F | Resp 20 | Ht 64.0 in | Wt 161.0 lb

## 2020-06-24 DIAGNOSIS — I82220 Acute embolism and thrombosis of inferior vena cava: Secondary | ICD-10-CM

## 2020-06-24 DIAGNOSIS — N184 Chronic kidney disease, stage 4 (severe): Secondary | ICD-10-CM | POA: Diagnosis not present

## 2020-06-24 DIAGNOSIS — D49511 Neoplasm of unspecified behavior of right kidney: Secondary | ICD-10-CM

## 2020-06-24 NOTE — Progress Notes (Signed)
VASCULAR AND VEIN SPECIALISTS OF [place] PROGRESS NOTE  ASSESSMENT / PLAN: Jason Nielsen is a 80 y.o. male with: 1) CKD 4 nearing end-stage renal disease.  His left upper extremity brachiobasilic AV fistula appears healthy on clinical exam and duplex.  He may use this anytime it is needed for dialysis.    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  Unfortunately his renal vein margin was positive.  He now has MR evidence of extensive tumor thrombus extending from the pararenal inferior vena cava to the hepatic veins.  Counseled the patient extensively (greater than 20 minutes) about the nature of this finding and our options going forward.    To resect the thrombus, we would need to expose the perihepatic vena cava to allow proximal and distal control on the vein.  We would need intraoperative transesophageal echocardiogram guidance to ensure we did not embolize the thrombus.  I counseled the patient that thrombectomy could be rather simple, involving simple embolectomy of the affected segment.  If the thrombus is densely adherent to the vena cava, we would need to resect a segment of the vena cava which would likely require replacement with large Jason externally supported Gore-Tex graft.  I quoted the patient a high likelihood of complication, prolonged recovery, and need for temporary stay in a skilled care facility (see ACS NSQIP estimates below). He would likely progress to ESRD.   Patient has scheduled follow-up with [name] next week.  I will review the films with my partners and discuss further with [name]. We will see the patient back in 2-3 weeks. I encouraged him to bring a loved one to his appointment.  SUBJECTIVE: Returns for evaluation of his fistula. Incidentally was found to have extensive tumor thrombus in his IVC on recent MRI. Discussed case with [name] yesterday.   OBJECTIVE: BP 140/70 (BP Location: Right Arm, Patient Position:  Sitting, Cuff Size: Normal)   Pulse 74   Temp 99 F (37.2 C)   Resp 20   Ht 5\' 4"  (1.626 m)   Wt 161 lb (73 kg)   SpO2 99%   BMI 27.64 kg/m   Left upper extremity brachiobasilic fistula with strong thrill  CBC Latest Ref Rng & Units 04/07/2020 02/13/2020 02/12/2020  WBC 4.0 - 10.5 K/uL - 8.1 -  Hemoglobin 13.0 - 17.0 g/dL 9.9(L) 7.8(L) 8.5(L)  Hematocrit 39.0 - 52.0 % 29.0(L) 25.8(L) 27.5(L)  Platelets 150 - 400 K/uL - 155 -     CMP Latest Ref Rng & Units 04/07/2020 02/13/2020 02/12/2020  Glucose 70 - 99 mg/dL 68(L) 186(H) 381(H)  BUN 8 - 23 mg/dL 47(H) 44(H) 42(H)  Creatinine 0.61 - 1.24 mg/dL 3.80(H) 2.80(H) 2.55(H)  Sodium 135 - 145 mmol/L 140 135 132(L)  Potassium 3.5 - 5.1 mmol/L 4.3 4.4 4.9  Chloride 98 - 111 mmol/L 104 102 102  CO2 22 - 32 mmol/L - 22 21(L)  Calcium 8.9 - 10.3 mg/dL - 8.3(L) 8.4(L)  Total Protein 6.0 - 8.3 g/dL - - -  Total Bilirubin 0.3 - 1.2 mg/dL - - -  Alkaline Phos 39 - 117 U/L - - -  AST 0 - 37 U/L - - -  ALT 0 - 53 U/L - - -      [name] N. Stanford Breed, MD [facility] and [facility]in Specialists of [facility] Phone Number: [phone] 06/24/2020 1:38 PM

## 2020-06-30 DIAGNOSIS — I770 Arteriovenous fistula, acquired: Secondary | ICD-10-CM | POA: Diagnosis not present

## 2020-06-30 DIAGNOSIS — D49511 Neoplasm of unspecified behavior of right kidney: Secondary | ICD-10-CM | POA: Diagnosis not present

## 2020-06-30 DIAGNOSIS — R809 Proteinuria, unspecified: Secondary | ICD-10-CM | POA: Diagnosis not present

## 2020-06-30 DIAGNOSIS — N184 Chronic kidney disease, stage 4 (severe): Secondary | ICD-10-CM | POA: Diagnosis not present

## 2020-06-30 DIAGNOSIS — R609 Edema, unspecified: Secondary | ICD-10-CM | POA: Diagnosis not present

## 2020-06-30 DIAGNOSIS — I82220 Acute embolism and thrombosis of inferior vena cava: Secondary | ICD-10-CM | POA: Diagnosis not present

## 2020-06-30 DIAGNOSIS — N2581 Secondary hyperparathyroidism of renal origin: Secondary | ICD-10-CM | POA: Diagnosis not present

## 2020-06-30 DIAGNOSIS — Z905 Acquired absence of kidney: Secondary | ICD-10-CM | POA: Diagnosis not present

## 2020-06-30 DIAGNOSIS — I129 Hypertensive chronic kidney disease with stage 1 through stage 4 chronic kidney disease, or unspecified chronic kidney disease: Secondary | ICD-10-CM | POA: Diagnosis not present

## 2020-06-30 DIAGNOSIS — E1122 Type 2 diabetes mellitus with diabetic chronic kidney disease: Secondary | ICD-10-CM | POA: Diagnosis not present

## 2020-06-30 DIAGNOSIS — N2889 Other specified disorders of kidney and ureter: Secondary | ICD-10-CM | POA: Diagnosis not present

## 2020-07-02 DIAGNOSIS — C641 Malignant neoplasm of right kidney, except renal pelvis: Secondary | ICD-10-CM | POA: Diagnosis not present

## 2020-07-08 ENCOUNTER — Ambulatory Visit (INDEPENDENT_AMBULATORY_CARE_PROVIDER_SITE_OTHER): Payer: Medicare Other | Admitting: Vascular Surgery

## 2020-07-08 ENCOUNTER — Encounter: Payer: Self-pay | Admitting: Vascular Surgery

## 2020-07-08 ENCOUNTER — Other Ambulatory Visit: Payer: Self-pay

## 2020-07-08 VITALS — BP 145/68 | HR 85 | Temp 98.4°F | Resp 20 | Ht 64.0 in | Wt 161.0 lb

## 2020-07-08 DIAGNOSIS — C641 Malignant neoplasm of right kidney, except renal pelvis: Secondary | ICD-10-CM

## 2020-07-08 DIAGNOSIS — D49511 Neoplasm of unspecified behavior of right kidney: Secondary | ICD-10-CM | POA: Diagnosis not present

## 2020-07-08 DIAGNOSIS — I82220 Acute embolism and thrombosis of inferior vena cava: Secondary | ICD-10-CM | POA: Diagnosis not present

## 2020-07-08 NOTE — H&P (View-Only) (Signed)
VASCULAR AND VEIN SPECIALISTS OF [place] PROGRESS NOTE  ASSESSMENT / PLAN: Jason Nielsen is a 80 y.o. male with: 1) CKD 4 nearing end-stage renal disease.  His left upper extremity brachiobasilic AV fistula appears healthy on clinical exam and duplex.  He may use this anytime it is needed for dialysis.    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  Unfortunately his renal vein margin was positive.  He now has MR evidence of extensive tumor thrombus extending from the pararenal inferior vena cava to the hepatic veins.   I offered percutaneous thrombectomy to the patient as an option to avoid a high risk open IVC thrombectomy. He is interested in this approach and understands it is not the standard of care, but that it may clear the tumor thrombus. We will plan to do this under general anesthesia in our hybrid operating room. We will use intraoperative transesophageal echocardiogram and intravascular ultrasound to monitor the thrombus. He prefers 07/18/20.  SUBJECTIVE: No interval complaints. Presents to clinic with his wife to discuss options. I again reviewed the open approach to this thrombectomy. I also offered a minimally invasive approach. He is interested in the latter.   OBJECTIVE: BP (!) 145/68 (BP Location: Right Arm, Patient Position: Sitting, Cuff Size: Normal)   Pulse 85   Temp 98.4 F (36.9 C)   Resp 20   Ht 5\' 4"  (1.626 m)   Wt 161 lb (73 kg)   SpO2 98%   BMI 27.64 kg/m   Left upper extremity brachiobasilic fistula with strong thrill  CBC Latest Ref Rng & Units 04/07/2020 02/13/2020 02/12/2020  WBC 4.0 - 10.5 K/uL - 8.1 -  Hemoglobin 13.0 - 17.0 g/dL 9.9(L) 7.8(L) 8.5(L)  Hematocrit 39.0 - 52.0 % 29.0(L) 25.8(L) 27.5(L)  Platelets 150 - 400 K/uL - 155 -     CMP Latest Ref Rng & Units 04/07/2020 02/13/2020 02/12/2020  Glucose 70 - 99 mg/dL 68(L) 186(H) 381(H)  BUN 8 - 23 mg/dL 47(H) 44(H) 42(H)  Creatinine 0.61 - 1.24 mg/dL 3.80(H) 2.80(H)  2.55(H)  Sodium 135 - 145 mmol/L 140 135 132(L)  Potassium 3.5 - 5.1 mmol/L 4.3 4.4 4.9  Chloride 98 - 111 mmol/L 104 102 102  CO2 22 - 32 mmol/L - 22 21(L)  Calcium 8.9 - 10.3 mg/dL - 8.3(L) 8.4(L)  Total Protein 6.0 - 8.3 g/dL - - -  Total Bilirubin 0.3 - 1.2 mg/dL - - -  Alkaline Phos 39 - 117 U/L - - -  AST 0 - 37 U/L - - -  ALT 0 - 53 U/L - - -   Alassane Kalafut N. Stanford Breed, MD Vascular and Vein Specialists of [facility] Phone Number: [phone] 07/08/2020 8:30 AM

## 2020-07-08 NOTE — Progress Notes (Signed)
VASCULAR AND VEIN SPECIALISTS OF [place] PROGRESS NOTE  ASSESSMENT / PLAN: Jason Nielsen is a 80 y.o. male with: 1) CKD 4 nearing end-stage renal disease.  His left upper extremity brachiobasilic AV fistula appears healthy on clinical exam and duplex.  He may use this anytime it is needed for dialysis.    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  Unfortunately his renal vein margin was positive.  He now has MR evidence of extensive tumor thrombus extending from the pararenal inferior vena cava to the hepatic veins.   I offered percutaneous thrombectomy to the patient as an option to avoid a high risk open IVC thrombectomy. He is interested in this approach and understands it is not the standard of care, but that it may clear the tumor thrombus. We will plan to do this under general anesthesia in our hybrid operating room. We will use intraoperative transesophageal echocardiogram and intravascular ultrasound to monitor the thrombus. He prefers 07/18/20.  SUBJECTIVE: No interval complaints. Presents to clinic with his wife to discuss options. I again reviewed the open approach to this thrombectomy. I also offered a minimally invasive approach. He is interested in the latter.   OBJECTIVE: BP (!) 145/68 (BP Location: Right Arm, Patient Position: Sitting, Cuff Size: Normal)   Pulse 85   Temp 98.4 F (36.9 C)   Resp 20   Ht 5\' 4"  (1.626 m)   Wt 161 lb (73 kg)   SpO2 98%   BMI 27.64 kg/m   Left upper extremity brachiobasilic fistula with strong thrill  CBC Latest Ref Rng & Units 04/07/2020 02/13/2020 02/12/2020  WBC 4.0 - 10.5 K/uL - 8.1 -  Hemoglobin 13.0 - 17.0 g/dL 9.9(L) 7.8(L) 8.5(L)  Hematocrit 39.0 - 52.0 % 29.0(L) 25.8(L) 27.5(L)  Platelets 150 - 400 K/uL - 155 -     CMP Latest Ref Rng & Units 04/07/2020 02/13/2020 02/12/2020  Glucose 70 - 99 mg/dL 68(L) 186(H) 381(H)  BUN 8 - 23 mg/dL 47(H) 44(H) 42(H)  Creatinine 0.61 - 1.24 mg/dL 3.80(H) 2.80(H)  2.55(H)  Sodium 135 - 145 mmol/L 140 135 132(L)  Potassium 3.5 - 5.1 mmol/L 4.3 4.4 4.9  Chloride 98 - 111 mmol/L 104 102 102  CO2 22 - 32 mmol/L - 22 21(L)  Calcium 8.9 - 10.3 mg/dL - 8.3(L) 8.4(L)  Total Protein 6.0 - 8.3 g/dL - - -  Total Bilirubin 0.3 - 1.2 mg/dL - - -  Alkaline Phos 39 - 117 U/L - - -  AST 0 - 37 U/L - - -  ALT 0 - 53 U/L - - -   Tigerlily Christine N. Stanford Breed, MD Vascular and Vein Specialists of [facility] Phone Number: [phone] 07/08/2020 8:30 AM

## 2020-07-14 NOTE — Pre-Procedure Instructions (Addendum)
Surgical Instructions    Your procedure is scheduled on Friday July 15th.  Report to [facility] Main Entrance "A" at 05:30 A.M., then check in with the Admitting office.  Call this number if you have problems the morning of surgery:  [phone]   If you have any questions prior to your surgery date call [phone]: Open Monday-Friday 8am-4pm    Remember:  Do not eat or drink after midnight the night before your surgery     Take these medicines the morning of surgery with A SIP OF WATER   allopurinol (ZYLOPRIM)  amLODipine (NORVASC)   atorvastatin (LIPITOR)  carvedilol (COREG)  docusate sodium (COLACE)   HYDROcodone-acetaminophen (NORCO/VICODIN)- If needed  Please follow your surgeon's instructions on when to stop taking clopidogrel (PLAVIX). If you have not received instructions then please contact your surgeon's office.    WHAT DO I DO ABOUT MY DIABETES MEDICATION?   Do not take oral diabetes medicines (pills) the morning of surgery.      THE MORNING OF SURGERY, DO NOT TAKE TRESIBA FLEXTOUCH 200 UNIT/ML FlexTouch Pen  The day of surgery, do not take other diabetes injectables, including Byetta (exenatide), Bydureon (exenatide ER), Victoza (liraglutide), or Trulicity (dulaglutide).   HOW TO MANAGE YOUR DIABETES BEFORE AND AFTER SURGERY  Why is it important to control my blood sugar before and after surgery? Improving blood sugar levels before and after surgery helps healing and can limit problems. A way of improving blood sugar control is eating a healthy diet by:  Eating less sugar and carbohydrates  Increasing activity/exercise  Talking with your doctor about reaching your blood sugar goals High blood sugars (greater than 180 mg/dL) can raise your risk of infections and slow your recovery, so you will need to focus on controlling your diabetes during the weeks before surgery. Make sure that the doctor who takes care of your diabetes knows about your planned  surgery including the date and location.  How do I manage my blood sugar before surgery? Check your blood sugar at least 4 times a day, starting 2 days before surgery, to make sure that the level is not too high or low.  Check your blood sugar the morning of your surgery when you wake up and every 2 hours until you get to the Short Stay unit.  If your blood sugar is less than 70 mg/dL, you will need to treat for low blood sugar: Do not take insulin. Treat a low blood sugar (less than 70 mg/dL) with  cup of clear juice (cranberry or apple), 4 glucose tablets, OR glucose gel. Recheck blood sugar in 15 minutes after treatment (to make sure it is greater than 70 mg/dL). If your blood sugar is not greater than 70 mg/dL on recheck, call [phone] for further instructions. Report your blood sugar to the short stay nurse when you get to Short Stay.  If you are admitted to the hospital after surgery: Your blood sugar will be checked by the staff and you will probably be given insulin after surgery (instead of oral diabetes medicines) to make sure you have good blood sugar levels. The goal for blood sugar control after surgery is 80-180 mg/dL.   As of today, STOP taking any Aspirin (unless otherwise instructed by your surgeon) Aleve, Naproxen, Ibuprofen, Motrin, Advil, Goody's, BC's, all herbal medications, fish oil, and all vitamins.                     Do NOT Smoke (Tobacco/Vaping)  or drink Alcohol 24 hours prior to your procedure.  If you use a CPAP at night, you may bring all equipment for your overnight stay.   Contacts, glasses, piercing's, hearing aid's, dentures or partials may not be worn into surgery, please bring cases for these belongings.    For patients admitted to the hospital, discharge time will be determined by your treatment team.   Patients discharged the day of surgery will not be allowed to drive home, and someone needs to stay with them for 24 hours.  ONLY 1 SUPPORT  PERSON MAY BE PRESENT WHILE YOU ARE IN SURGERY. IF YOU ARE TO BE ADMITTED ONCE YOU ARE IN YOUR ROOM YOU WILL BE ALLOWED TWO (2) VISITORS.  Minor children may have two parents present. Special consideration for safety and communication needs will be reviewed on a case by case basis.   Special instructions:   [place]- Preparing For Surgery  Before surgery, you can play an important role. Because skin is not sterile, your skin needs to be as free of germs as possible. You can reduce the number of germs on your skin by washing with CHG (chlorahexidine gluconate) Soap before surgery.  CHG is an antiseptic cleaner which kills germs and bonds with the skin to continue killing germs even after washing.    Oral Hygiene is also important to reduce your risk of infection.  Remember - BRUSH YOUR TEETH THE MORNING OF SURGERY WITH YOUR REGULAR TOOTHPASTE  Please do not use if you have an allergy to CHG or antibacterial soaps. If your skin becomes reddened/irritated stop using the CHG.  Do not shave (including legs and underarms) for at least 48 hours prior to first CHG shower. It is OK to shave your face.  Please follow these instructions carefully.   Shower the NIGHT BEFORE SURGERY and the MORNING OF SURGERY  If you chose to wash your hair, wash your hair first as usual with your normal shampoo.  After you shampoo, rinse your hair and body thoroughly to remove the shampoo.  Use CHG Soap as you would any other liquid soap. You can apply CHG directly to the skin and wash gently with a scrungie or a clean washcloth.   Apply the CHG Soap to your body ONLY FROM THE NECK DOWN.  Do not use on open wounds or open sores. Avoid contact with your eyes, ears, mouth and genitals (private parts). Wash Face and genitals (private parts)  with your normal soap.   Wash thoroughly, paying special attention to the area where your surgery will be performed.  Thoroughly rinse your body with warm water from the neck  down.  DO NOT shower/wash with your normal soap after using and rinsing off the CHG Soap.  Pat yourself dry with a CLEAN TOWEL.  Wear CLEAN PAJAMAS to bed the night before surgery  Place CLEAN SHEETS on your bed the night before your surgery  DO NOT SLEEP WITH PETS.   Day of Surgery: Shower with CHG soap. Do not wear jewelry, make up, nail polish, gel polish, artificial nails, or any other type of covering on natural nails including finger and toenails. If patients have artificial nails, gel coating, etc. that need to be removed by a nail salon please have this removed prior to surgery. Surgery may need to be canceled/delayed if the surgeon/ anesthesia feels like the patient is unable to be adequately monitored. Do not wear lotions, powders, perfumes/colognes, or deodorant. Do not shave 48 hours prior to  surgery.  Men may shave face and neck. Do not bring valuables to the hospital. [facility] is not responsible for any belongings or valuables. Wear Clean/Comfortable clothing the morning of surgery Remember to brush your teeth WITH YOUR REGULAR TOOTHPASTE.   Please read over the following fact sheets that you were given.

## 2020-07-15 ENCOUNTER — Other Ambulatory Visit: Payer: Self-pay

## 2020-07-15 ENCOUNTER — Encounter (HOSPITAL_COMMUNITY): Payer: Self-pay

## 2020-07-15 ENCOUNTER — Encounter (HOSPITAL_COMMUNITY)
Admission: RE | Admit: 2020-07-15 | Discharge: 2020-07-15 | Disposition: A | Discharge status: Home / Self Care | Payer: Medicare Other | Source: Ambulatory Visit | Attending: Vascular Surgery | Admitting: Vascular Surgery

## 2020-07-15 DIAGNOSIS — Z01812 Encounter for preprocedural laboratory examination: Secondary | ICD-10-CM | POA: Diagnosis not present

## 2020-07-15 LAB — CBC
HCT: 35.3 % — ABNORMAL LOW (ref 39.0–52.0)
Hemoglobin: 11.3 g/dL — ABNORMAL LOW (ref 13.0–17.0)
MCH: 33.1 pg (ref 26.0–34.0)
MCHC: 32 g/dL (ref 30.0–36.0)
MCV: 103.5 fL — ABNORMAL HIGH (ref 80.0–100.0)
Platelets: 194 10*3/uL (ref 150–400)
RBC: 3.41 MIL/uL — ABNORMAL LOW (ref 4.22–5.81)
RDW: 15.9 % — ABNORMAL HIGH (ref 11.5–15.5)
WBC: 5.6 10*3/uL (ref 4.0–10.5)
nRBC: 0 % (ref 0.0–0.2)

## 2020-07-15 LAB — SURGICAL PCR SCREEN
MRSA, PCR: NEGATIVE
Staphylococcus aureus: NEGATIVE

## 2020-07-15 LAB — BASIC METABOLIC PANEL
Anion gap: 13 (ref 5–15)
BUN: 45 mg/dL — ABNORMAL HIGH (ref 8–23)
CO2: 26 mmol/L (ref 22–32)
Calcium: 9.4 mg/dL (ref 8.9–10.3)
Chloride: 98 mmol/L (ref 98–111)
Creatinine, Ser: 3.61 mg/dL — ABNORMAL HIGH (ref 0.61–1.24)
GFR, Estimated: 16 mL/min — ABNORMAL LOW (ref >60)
Glucose, Bld: 127 mg/dL — ABNORMAL HIGH (ref 70–99)
Potassium: 5.1 mmol/L (ref 3.5–5.1)
Sodium: 137 mmol/L (ref 135–145)

## 2020-07-15 LAB — HEMOGLOBIN A1C
Hgb A1c MFr Bld: 6.5 % — ABNORMAL HIGH (ref 4.8–5.6)
Mean Plasma Glucose: 139.85 mg/dL

## 2020-07-15 LAB — GLUCOSE, CAPILLARY: Glucose-Capillary: 150 mg/dL — ABNORMAL HIGH (ref 70–99)

## 2020-07-15 NOTE — Progress Notes (Addendum)
PCP - Dr. Brigitte Pulse Cardiologist - Dr. Harrell Gave- [facility]  PPM/ICD - n/a Device Orders - n/a Rep Notified - n/a  Chest x-ray - n/a EKG - 01/10/20 Stress Test - denies ECHO - denies Cardiac Cath - denies  Sleep Study - denies CPAP - n/a  Fasting Blood Sugar - 150 Checks Blood Sugar once a day  Blood Thinner Instructions: Patient states he was instructed by his surgeon to stop Plavix on Saturday 07/12/20. Last dose was taken on 07/12/20. Aspirin Instructions: n/a  ERAS Protcol - n/a PRE-SURGERY Ensure or G2- n/a Nothing to eat or drink after midnight  COVID TEST- No. Posted As Ambulatory Surgery   Anesthesia review: Yes. Per anesthesia review values. Elevated creatinine.   Patient denies shortness of breath, fever, cough and chest pain at PAT appointment   All instructions explained to the patient, with a verbal understanding of the material. Patient agrees to go over the instructions while at home for a better understanding. Patient also instructed to self quarantine after being tested for COVID-19. The opportunity to ask questions was provided.

## 2020-07-16 NOTE — Progress Notes (Addendum)
Anesthesia Chart Review:  Case: [id number] Date/Time: 07/18/20 0715   Procedure: INFERIOR VENA CAVA TUMOR THROMBECTOMY - Need anesthesiologist to do TEE   Anesthesia type: General   Pre-op diagnosis: RENAL CELL CARCINOMA, IVC THROMBUS   Location: [facility] OR ROOM 16 / [place] OR   Surgeons: Cherre Robins, MD       DISCUSSION: Patient is an 80 year old male scheduled for the above procedure. He is s/p right nephrectomy for [facility] 02/11/20. Preoperative MRI showed non-occlusive thrombus in the IVC. 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 Per 07/08/20 note by Dr. Stanford Breed, "He now has MR evidence of extensive tumor thrombus extending from the pararenal inferior vena cava to the hepatic veins.   I offered percutaneous thrombectomy to the patient as an option to avoid a high risk open IVC thrombectomy. He is interested in this approach and understands it is not the standard of care, but that it may clear the tumor thrombus. We will plan to do this under general anesthesia in our hybrid operating room. We will use intraoperative transesophageal echocardiogram and intravascular ultrasound to monitor the thrombus."   Other history includes former smoker (quit 01/04/78), DM2, CKD (stage IV; s/p LUE brachiobasilic AVF, 2nd stage 01/09/05), HTN, prostate cancer (s/p radiation seed 04/22/08), HLD, anemia, CVA, renal cancer (s/p right laparoscopic radical nephrectomy, balloon dilation or urethral stricture 02/11/20).    He is not followed routinely by cardiology, but had a preoperative evaluation by Dr. Harrell Gave on 01/10/20 prior to nephrectomy. At that time she wrote, "Based on available date, patient's RCRI score = 4, which carries a 15% 30-day risk of death, MI, or cardiac arrest. However, these risk factors are non-cardiac (type of surgery, history of CVA, diabetes on insulin, Cr of 2). Therefore,  he is at elevated risk of complications due to non-cardiac conditions.    The patient is not currently having active cardiac symptoms, and they can achieve >4 METs of activity. He is very active at baseline, with his stationary bike routine very strenuous without any cardiac symptoms.   According to ACC/AHA Guidelines, no further testing is needed." She also added that he has a "very quiet" murmur, "low risk, no indication for echo based on guidelines". As need cardiology follow-up recommended.   By notes, he is on Plavix for history of CVA. Per [facility], patient to hold 5 days prior. Last 07/12/20.  Anesthesia team to evaluate on the day of surgery.  ADDENDUM 07/17/20 10:36 AM:  Mora Bellman 06/30/20 office note from [facility]. He had visit with Anice Paganini, PA-C. She notes his Creatinine has been trending up post nephrectomy and has been up to the 4 range, but at 3.54 that day. His swelling had significantly improved, on Lasix. His AVF was ready to use if needed, and "Patient is accepting of needing dialysis if required". At that time, patient was still debating options for management of IVC thrombus. Two month nephrology follow-up planned.    I notified Rich in Echo scheduling that TEE planned intraoperatively, and he will make the appropriate arrangements.     VS: BP (!) 144/65   Pulse 76   Temp 36.8 C   Resp 18   Ht 5\' 4"  (1.626 m)   Wt 74 kg   SpO2 100%   BMI 28.01 kg/m    PROVIDERS: Ginger Organ., MD  is PCP  Raynelle Bring, MD is urologist Gean Quint, MD is nephrologist. Last office note requested, but is pending. Buford Dresser, MD is cardiologist.  He was seen on 01/10/2020 for preoperative evaluation prior to nephrectomy.  As needed follow-up recommended.   LABS: Preoperative labs noted. Cr 3.61, consistent with results from 04/07/20. He has a AVF ready for use if needed, and is followed by nephrology and urology.  (all labs ordered are listed, but  only abnormal results are displayed)  Labs Reviewed  GLUCOSE, CAPILLARY - Abnormal; Notable for the following components:      Result Value   Glucose-Capillary 150 (*)    All other components within normal limits  CBC - Abnormal; Notable for the following components:   RBC 3.41 (*)    Hemoglobin 11.3 (*)    HCT 35.3 (*)    MCV 103.5 (*)    RDW 15.9 (*)    All other components within normal limits  BASIC METABOLIC PANEL - Abnormal; Notable for the following components:   Glucose, Bld 127 (*)    BUN 45 (*)    Creatinine, Ser 3.61 (*)    GFR, Estimated 16 (*)    All other components within normal limits  HEMOGLOBIN A1C - Abnormal; Notable for the following components:   Hgb A1c MFr Bld 6.5 (*)    All other components within normal limits  SURGICAL PCR SCREEN     IMAGES: MR Abd 06/13/20: IMPRESSION: 1. 9.6 x 3.0 x 2.9 cm enhancing tumor in the IVC. I do not see any definite extension up above the right hemidiaphragm or into the right atrium. 2. Status post right nephrectomy.  No abdominal lymphadenopathy.   EKG: 01/10/20: NSR    CV: BLE Venous US 01/01/20: Summary:  RIGHT:  - There is no evidence of deep vein thrombosis in the lower extremity.  - No cystic structure found in the popliteal fossa.     LEFT:  - There is no evidence of deep vein thrombosis in the lower extremity.  - No cystic structure found in the popliteal fossa.    Past Medical History:  Diagnosis Date   Anemia    Arthritis    Chronic kidney disease    Diabetes mellitus without complication (HCC)    HLD (hyperlipidemia)    Hypertension    Prostate cancer ([place])    Renal cancer ([place])    Right   Stroke [facility])     Past Surgical History:  Procedure Laterality Date   AV FISTULA PLACEMENT Left 02/07/2020   Procedure: LEFT UPPER EXTREMITY FIRST STAGE BRACHIO-BASILIC ARTERIOVENOUS (AV) FISTULA CREATION;  Surgeon: Cherre Robins, MD;  Location: [place];  Service: Vascular;  Laterality: Left;  PERIPHERAL  NERVE BLOCK   [place] Left 04/07/2020   Procedure: LEFT ARM SECOND STAGE [place];  Surgeon: Cherre Robins, MD;  Location: [place];  Service: Vascular;  Laterality: Left;   CYSTOSCOPY  02/11/2020   Procedure: FLEXIBLE CYSTOSCOPY, URETHERAL DILITATION FOR URETHERAL [place];  Surgeon: Raynelle Bring, MD;  Location: [facility];  Service: Urology;;   LAPAROSCOPIC NEPHRECTOMY Right 02/11/2020   Procedure: LAPAROSCOPIC RADICAL NEPHRECTOMY;  Surgeon: Raynelle Bring, MD;  Location: [facility];  Service: Urology;  Laterality: Right;   PROSTATE BIOPSY     RENAL BIOPSY     TRANSPERINEAL IMPLANT OF RADIATION SEEDS W/ ULTRASOUND      MEDICATIONS:  allopurinol (ZYLOPRIM) 100 MG tablet   amLODipine (NORVASC) 10 MG  tablet   atorvastatin (LIPITOR) 40 MG tablet   BD PEN NEEDLE NANO 2ND GEN 32G X 4 MM MISC   carvedilol (COREG) 25 MG tablet   clopidogrel (PLAVIX) 75 MG tablet   docusate sodium (COLACE) 100 MG capsule   ferrous sulfate 325 (65 FE) MG tablet   HYDROcodone-acetaminophen (NORCO/VICODIN) 5-325 MG tablet   OneTouch Delica Lancets 38Z MISC   ONETOUCH VERIO test strip   sodium bicarbonate 650 MG tablet   TRESIBA FLEXTOUCH 200 UNIT/ML FlexTouch Pen   No current facility-administered medications for this encounter.    Myra Gianotti, PA-C Surgical Short Stay/Anesthesiology [facility] Phone [phone] [facility] Phone [phone] 07/16/2020 6:29 PM

## 2020-07-16 NOTE — Anesthesia Preprocedure Evaluation (Addendum)
Anesthesia Evaluation  Patient identified by MRN, date of birth, ID band Patient awake    Reviewed: Allergy & Precautions, NPO status , Patient's Chart, lab work & pertinent test results  Airway Mallampati: II  TM Distance: >3 FB Neck ROM: Full    Dental no notable dental hx.    Pulmonary neg pulmonary ROS, former smoker,    Pulmonary exam normal breath sounds clear to auscultation       Cardiovascular hypertension,  Rhythm:Regular Rate:Normal + Systolic murmurs 9.6 x 3.0 x 2.9 cm enhancing tumor in the IVC. I do not see any definite extension up above the right hemidiaphragm or into the right atrium. 2. Status post right nephrectomy.  No abdominal lymphadenopathy   Neuro/Psych CVA, Residual Symptoms negative psych ROS   GI/Hepatic Neg liver ROS, GERD  ,  Endo/Other  diabetes, Insulin Dependent  Renal/GU Renal InsufficiencyRenal disease  negative genitourinary   Musculoskeletal negative musculoskeletal ROS (+)   Abdominal   Peds negative pediatric ROS (+)  Hematology  (+) anemia ,   Anesthesia Other Findings   Reproductive/Obstetrics negative OB ROS                           Anesthesia Physical Anesthesia Plan  ASA: 4  Anesthesia Plan: General   Post-op Pain Management:    Induction: Intravenous  PONV Risk Score and Plan: 2 and Ondansetron, Dexamethasone and Treatment may vary due to age or medical condition  Airway Management Planned: Oral ETT  Additional Equipment: Arterial line  Intra-op Plan:   Post-operative Plan: Extubation in OR  Informed Consent: I have reviewed the patients History and Physical, chart, labs and discussed the procedure including the risks, benefits and alternatives for the proposed anesthesia with the patient or authorized representative who has indicated his/her understanding and acceptance.     Dental advisory given  Plan Discussed with: CRNA  and Surgeon  Anesthesia Plan Comments: (PAT note written 07/16/2020 by Myra Gianotti, PA-C.  Echo Scheduling notified of plans for intraoperative TEE. )     Anesthesia Quick Evaluation

## 2020-07-18 ENCOUNTER — Ambulatory Visit (HOSPITAL_COMMUNITY): Payer: Medicare Other

## 2020-07-18 ENCOUNTER — Other Ambulatory Visit: Payer: Self-pay

## 2020-07-18 ENCOUNTER — Encounter (HOSPITAL_COMMUNITY)
Admission: RE | Disposition: A | Discharge status: Home / Self Care | Payer: Self-pay | Source: Home / Self Care | Attending: Vascular Surgery

## 2020-07-18 ENCOUNTER — Ambulatory Visit (HOSPITAL_COMMUNITY): Payer: Medicare Other | Admitting: Vascular Surgery

## 2020-07-18 ENCOUNTER — Ambulatory Visit (HOSPITAL_COMMUNITY)
Admission: RE | Admit: 2020-07-18 | Discharge: 2020-07-18 | Disposition: A | Discharge status: Home / Self Care | Payer: Medicare Other | Attending: Vascular Surgery | Admitting: Vascular Surgery

## 2020-07-18 ENCOUNTER — Encounter (HOSPITAL_COMMUNITY): Payer: Self-pay | Admitting: Vascular Surgery

## 2020-07-18 DIAGNOSIS — N1832 Chronic kidney disease, stage 3b: Secondary | ICD-10-CM | POA: Diagnosis not present

## 2020-07-18 DIAGNOSIS — D631 Anemia in chronic kidney disease: Secondary | ICD-10-CM | POA: Diagnosis not present

## 2020-07-18 DIAGNOSIS — I82220 Acute embolism and thrombosis of inferior vena cava: Secondary | ICD-10-CM | POA: Diagnosis not present

## 2020-07-18 DIAGNOSIS — I129 Hypertensive chronic kidney disease with stage 1 through stage 4 chronic kidney disease, or unspecified chronic kidney disease: Secondary | ICD-10-CM | POA: Diagnosis not present

## 2020-07-18 DIAGNOSIS — C649 Malignant neoplasm of unspecified kidney, except renal pelvis: Secondary | ICD-10-CM | POA: Diagnosis not present

## 2020-07-18 DIAGNOSIS — E1122 Type 2 diabetes mellitus with diabetic chronic kidney disease: Secondary | ICD-10-CM | POA: Diagnosis not present

## 2020-07-18 DIAGNOSIS — Z905 Acquired absence of kidney: Secondary | ICD-10-CM | POA: Diagnosis not present

## 2020-07-18 DIAGNOSIS — N184 Chronic kidney disease, stage 4 (severe): Secondary | ICD-10-CM | POA: Diagnosis not present

## 2020-07-18 LAB — POCT I-STAT, CHEM 8
BUN: 43 mg/dL — ABNORMAL HIGH (ref 8–23)
Calcium, Ion: 0.94 mmol/L — ABNORMAL LOW (ref 1.15–1.40)
Chloride: 102 mmol/L (ref 98–111)
Creatinine, Ser: 3.9 mg/dL — ABNORMAL HIGH (ref 0.61–1.24)
Glucose, Bld: 132 mg/dL — ABNORMAL HIGH (ref 70–99)
HCT: 31 % — ABNORMAL LOW (ref 39.0–52.0)
Hemoglobin: 10.5 g/dL — ABNORMAL LOW (ref 13.0–17.0)
Potassium: 3.5 mmol/L (ref 3.5–5.1)
Sodium: 138 mmol/L (ref 135–145)
TCO2: 26 mmol/L (ref 22–32)

## 2020-07-18 LAB — TYPE AND SCREEN
ABO/RH(D): B POS
Antibody Screen: NEGATIVE

## 2020-07-18 LAB — GLUCOSE, CAPILLARY
Glucose-Capillary: 142 mg/dL — ABNORMAL HIGH (ref 70–99)
Glucose-Capillary: 111 mg/dL — ABNORMAL HIGH (ref 70–99)

## 2020-07-18 LAB — POCT ACTIVATED CLOTTING TIME: Activated Clotting Time: 237 seconds

## 2020-07-18 SURGERY — THROMBECTOMY, PERCUTANEOUS, TRANSLUMINAL
Anesthesia: General | Site: Groin | Laterality: Right

## 2020-07-18 MED ORDER — HEPARIN SODIUM (PORCINE) 1000 UNIT/ML IJ SOLN
INTRAMUSCULAR | Status: DC | PRN
  Administered 2020-07-18: 8000 [IU] via INTRAVENOUS

## 2020-07-18 MED ORDER — PROPOFOL 10 MG/ML IV BOLUS
INTRAVENOUS | Status: DC | PRN
  Administered 2020-07-18: 100 mg via INTRAVENOUS

## 2020-07-18 MED ORDER — ROCURONIUM BROMIDE 10 MG/ML (PF) SYRINGE
PREFILLED_SYRINGE | INTRAVENOUS | Status: AC
  Filled 2020-07-18: qty 10

## 2020-07-18 MED ORDER — CHLORHEXIDINE GLUCONATE 0.12 % MT SOLN
15.0000 mL | Freq: Once | OROMUCOSAL | Status: AC
  Administered 2020-07-18: 15 mL via OROMUCOSAL
  Filled 2020-07-18: qty 15

## 2020-07-18 MED ORDER — ETOMIDATE 2 MG/ML IV SOLN
INTRAVENOUS | Status: AC
  Filled 2020-07-18: qty 10

## 2020-07-18 MED ORDER — APIXABAN 2.5 MG PO TABS: 2.5000 mg | ORAL_TABLET | Freq: Two times a day (BID) | ORAL | 3 refills | Status: DC

## 2020-07-18 MED ORDER — ONDANSETRON HCL 4 MG/2ML IJ SOLN
INTRAMUSCULAR | Status: AC
  Filled 2020-07-18: qty 2

## 2020-07-18 MED ORDER — CHLORHEXIDINE GLUCONATE 4 % EX LIQD: 60.0000 mL | Freq: Once | CUTANEOUS | Status: DC

## 2020-07-18 MED ORDER — LIDOCAINE 2% (20 MG/ML) 5 ML SYRINGE
INTRAMUSCULAR | Status: AC
  Filled 2020-07-18: qty 5

## 2020-07-18 MED ORDER — HEPARIN 6000 UNIT IRRIGATION SOLUTION
Status: AC
  Filled 2020-07-18: qty 500

## 2020-07-18 MED ORDER — FENTANYL CITRATE (PF) 250 MCG/5ML IJ SOLN
INTRAMUSCULAR | Status: AC
  Filled 2020-07-18: qty 5

## 2020-07-18 MED ORDER — ONDANSETRON HCL 4 MG/2ML IJ SOLN
INTRAMUSCULAR | Status: DC | PRN
  Administered 2020-07-18: 4 mg via INTRAVENOUS

## 2020-07-18 MED ORDER — PHENYLEPHRINE HCL-NACL 10-0.9 MG/250ML-% IV SOLN
INTRAVENOUS | Status: DC | PRN
  Administered 2020-07-18: 50 ug/min via INTRAVENOUS

## 2020-07-18 MED ORDER — PHENYLEPHRINE HCL-NACL 10-0.9 MG/250ML-% IV SOLN
INTRAVENOUS | Status: AC
  Filled 2020-07-18: qty 500

## 2020-07-18 MED ORDER — SODIUM CHLORIDE (PF) 0.9 % IJ SOLN
INTRAVENOUS | Status: DC | PRN
  Administered 2020-07-18: 30 mL via INTRAMUSCULAR

## 2020-07-18 MED ORDER — PROPOFOL 10 MG/ML IV BOLUS
INTRAVENOUS | Status: AC
  Filled 2020-07-18: qty 20

## 2020-07-18 MED ORDER — DEXAMETHASONE SODIUM PHOSPHATE 10 MG/ML IJ SOLN
INTRAMUSCULAR | Status: DC | PRN
  Administered 2020-07-18: 5 mg via INTRAVENOUS

## 2020-07-18 MED ORDER — ORAL CARE MOUTH RINSE: 15.0000 mL | Freq: Once | OROMUCOSAL | Status: AC

## 2020-07-18 MED ORDER — FENTANYL CITRATE (PF) 250 MCG/5ML IJ SOLN
INTRAMUSCULAR | Status: DC | PRN
  Administered 2020-07-18 (×2): 50 ug via INTRAVENOUS

## 2020-07-18 MED ORDER — LACTATED RINGERS IV SOLN: INTRAVENOUS | Status: DC

## 2020-07-18 MED ORDER — SODIUM CHLORIDE 0.9 % IV SOLN: INTRAVENOUS | Status: DC

## 2020-07-18 MED ORDER — DEXAMETHASONE SODIUM PHOSPHATE 10 MG/ML IJ SOLN
INTRAMUSCULAR | Status: AC
  Filled 2020-07-18: qty 1

## 2020-07-18 MED ORDER — SUGAMMADEX SODIUM 200 MG/2ML IV SOLN
INTRAVENOUS | Status: DC | PRN
  Administered 2020-07-18: 200 mg via INTRAVENOUS

## 2020-07-18 MED ORDER — ROCURONIUM BROMIDE 10 MG/ML (PF) SYRINGE
PREFILLED_SYRINGE | INTRAVENOUS | Status: DC | PRN
  Administered 2020-07-18: 60 mg via INTRAVENOUS

## 2020-07-18 MED ORDER — CEFAZOLIN SODIUM-DEXTROSE 2-4 GM/100ML-% IV SOLN
2.0000 g | INTRAVENOUS | Status: AC
  Administered 2020-07-18: 2 g via INTRAVENOUS
  Filled 2020-07-18: qty 100

## 2020-07-18 MED ORDER — HEPARIN 6000 UNIT IRRIGATION SOLUTION
Status: DC | PRN
  Administered 2020-07-18: 1

## 2020-07-18 MED ORDER — LIDOCAINE 2% (20 MG/ML) 5 ML SYRINGE
INTRAMUSCULAR | Status: DC | PRN
  Administered 2020-07-18: 100 mg via INTRAVENOUS

## 2020-07-18 SURGICAL SUPPLY — 48 items
BAG COUNTER SPONGE SURGICOUNT (BAG) IMPLANT
CANISTER SUCT 3000ML PPV (MISCELLANEOUS) IMPLANT
CATH BEACON 5 .035 65 KMP TIP (CATHETERS) IMPLANT
CATH CLOT TRIEVER BOLD (CATHETERS) ×1 IMPLANT
CATH ROBINSON RED A/P 18FR (CATHETERS) ×1 IMPLANT
CATH VISIONS PV .035 IVUS (CATHETERS) ×1 IMPLANT
CATHETER TRIEVER24 ASPIRATION (MISCELLANEOUS) ×1 IMPLANT
CHLORAPREP W/TINT 26 (MISCELLANEOUS) ×2 IMPLANT
CLIP LIGATING EXTRA MED SLVR (CLIP) ×2 IMPLANT
CLIP LIGATING EXTRA SM BLUE (MISCELLANEOUS) ×2 IMPLANT
COVER MAYO STAND STRL (DRAPES) ×2 IMPLANT
DERMABOND ADVANCED (GAUZE/BANDAGES/DRESSINGS) ×1
DERMABOND ADVANCED .7 DNX12 (GAUZE/BANDAGES/DRESSINGS) IMPLANT
DRYSEAL FLEXSHEATH 26FR 33CM (SHEATH) ×1
FILTER FLOWSAVER DEVICE (MISCELLANEOUS) ×1 IMPLANT
GAUZE 4X4 16PLY ~~LOC~~+RFID DBL (SPONGE) ×1 IMPLANT
GLIDEWIRE ADV .035X260CM (WIRE) ×1 IMPLANT
GLOVE SURG ENC MOIS LTX SZ7.5 (GLOVE) ×2 IMPLANT
GLOVE SURG SYN 7.5  E (GLOVE) ×3
GLOVE SURG SYN 7.5 E (GLOVE) ×3 IMPLANT
GLOVE SURG SYN 7.5 PF PI (GLOVE) IMPLANT
GOWN STRL REUS W/ TWL LRG LVL3 (GOWN DISPOSABLE) ×2 IMPLANT
GOWN STRL REUS W/ TWL XL LVL3 (GOWN DISPOSABLE) ×1 IMPLANT
GOWN STRL REUS W/TWL LRG LVL3 (GOWN DISPOSABLE) ×2
GOWN STRL REUS W/TWL XL LVL3 (GOWN DISPOSABLE) ×1
KIT BASIN OR (CUSTOM PROCEDURE TRAY) ×2 IMPLANT
KIT MICROPUNCTURE NIT STIFF (SHEATH) ×1 IMPLANT
NDL HYPO 25GX1X1/2 BEV (NEEDLE) IMPLANT
NEEDLE HYPO 25GX1X1/2 BEV (NEEDLE) IMPLANT
NS IRRIG 1000ML POUR BTL (IV SOLUTION) ×2 IMPLANT
PACK ENDO MINOR (CUSTOM PROCEDURE TRAY) ×2 IMPLANT
PAD ARMBOARD 7.5X6 YLW CONV (MISCELLANEOUS) ×4 IMPLANT
SET MICROPUNCTURE 5F STIFF (MISCELLANEOUS) ×2 IMPLANT
SHEATH CLOT RETRIEVER 16FR (SHEATH) ×1 IMPLANT
SHEATH DRYSEAL FLEX 26FR 33CM (SHEATH) IMPLANT
SHEATH PINNACLE 5F 10CM (SHEATH) ×2 IMPLANT
SHEATH PINNACLE 8F 10CM (SHEATH) ×1 IMPLANT
SLEEVE ISOL F/PACE RF HD COVER (MISCELLANEOUS) ×1 IMPLANT
SPONGE T-LAP 18X18 ~~LOC~~+RFID (SPONGE) ×1 IMPLANT
STOPCOCK MORSE 400PSI 3WAY (MISCELLANEOUS) ×1 IMPLANT
SUT ETHILON 3 0 PS 1 (SUTURE) ×1 IMPLANT
SYR CONTROL 10ML LL (SYRINGE) IMPLANT
TOWEL GREEN STERILE (TOWEL DISPOSABLE) ×2 IMPLANT
TOWEL GREEN STERILE FF (TOWEL DISPOSABLE) IMPLANT
WATER STERILE IRR 1000ML POUR (IV SOLUTION) IMPLANT
WIRE BENTSON .035X145CM (WIRE) ×2 IMPLANT
WIRE ROSEN-J .035X260CM (WIRE) ×1 IMPLANT
WIRE STARTER BENTSON 035X150 (WIRE) ×1 IMPLANT

## 2020-07-18 NOTE — Interval H&P Note (Signed)
History and Physical Interval Note:  07/18/2020 7:13 AM  Jason Nielsen  has presented today for surgery, with the diagnosis of RENAL CELL CARCINOMA, IVC THROMBUS.  The various methods of treatment have been discussed with the patient and family. After consideration of risks, benefits and other options for treatment, the patient has consented to  Procedure(s) with comments: [place] (N/A) - Need anesthesiologist to do TEE as a surgical intervention.  The patient's history has been reviewed, patient examined, no change in status, stable for surgery.  I have reviewed the patient's chart and labs.  Questions were answered to the patient's satisfaction.     Cherre Robins

## 2020-07-18 NOTE — Anesthesia Postprocedure Evaluation (Signed)
Anesthesia Post Note  Patient: Jason Nielsen  Procedure(s) Performed: INFERIOR VENA CAVA TUMOR THROMBECTOMY (Right: Groin)     Patient location during evaluation: PACU Anesthesia Type: General Level of consciousness: awake and alert Pain management: pain level controlled Vital Signs Assessment: post-procedure vital signs reviewed and stable Respiratory status: spontaneous breathing, nonlabored ventilation, respiratory function stable and patient connected to nasal cannula oxygen Cardiovascular status: blood pressure returned to baseline and stable Postop Assessment: no apparent nausea or vomiting Anesthetic complications: no   No notable events documented.  Last Vitals:  Vitals:   07/18/20 0938 07/18/20 0953  BP: 123/69 126/66  Pulse: 70 68  Resp: 11 17  Temp:    SpO2: 96% 95%    Last Pain:  Vitals:   07/18/20 0953  TempSrc:   PainSc: 0-No pain                 Contina Strain S

## 2020-07-18 NOTE — Transfer of Care (Signed)
Immediate Anesthesia Transfer of Care Note  Patient: Jason Nielsen  Procedure(s) Performed: INFERIOR VENA CAVA TUMOR THROMBECTOMY (Right: Groin)  Patient Location: PACU  Anesthesia Type:General  Level of Consciousness: awake, alert  and oriented  Airway & Oxygen Therapy: Patient Spontanous Breathing and Patient connected to nasal cannula oxygen  Post-op Assessment: Report given to RN and Post -op Vital signs reviewed and stable  Post vital signs: Reviewed and stable  Last Vitals:  Vitals Value Taken Time  BP 125/70 07/18/20 0923  Temp    Pulse 71 07/18/20 0924  Resp 11 07/18/20 0924  SpO2 96 % 07/18/20 0924  Vitals shown include unvalidated device data.  Last Pain:  Vitals:   07/18/20 0600  TempSrc:   PainSc: 0-No pain         Complications: No notable events documented.

## 2020-07-18 NOTE — Anesthesia Procedure Notes (Signed)
Procedure Name: Intubation Date/Time: 07/18/2020 7:48 AM Performed by: Merryl Hacker, RN Pre-anesthesia Checklist: Patient identified, Emergency Drugs available, Suction available and Patient being monitored Patient Re-evaluated:Patient Re-evaluated prior to induction Oxygen Delivery Method: Circle system utilized Preoxygenation: Pre-oxygenation with 100% oxygen Induction Type: IV induction Ventilation: Mask ventilation without difficulty and Oral airway inserted - appropriate to patient size Laryngoscope Size: Mac and 4 Grade View: Grade I Tube type: Oral Tube size: 7.5 mm Number of attempts: 1 Airway Equipment and Method: Stylet and Oral airway Placement Confirmation: ETT inserted through vocal cords under direct vision, positive ETCO2 and breath sounds checked- equal and bilateral Secured at: 22 cm Tube secured with: Tape Dental Injury: Teeth and Oropharynx as per pre-operative assessment  Comments: Performed by Heide Scales, SRNA under direct supervision by Dr. Iona Beard and Dewitt Hoes, CRNA

## 2020-07-18 NOTE — Discharge Instructions (Signed)
ELIQUIS starts tonight.  Keep rubber catheter on right groin until office visit.

## 2020-07-18 NOTE — Anesthesia Procedure Notes (Signed)
Arterial Line Insertion Start/End7/15/2022 7:15 AM Performed by: Trinna Post., CRNA, CRNA  Patient location: Pre-op. Preanesthetic checklist: patient identified, IV checked, site marked, risks and benefits discussed, surgical consent, monitors and equipment checked, pre-op evaluation, timeout performed and anesthesia consent Lidocaine 1% used for infiltration Right, radial was placed Catheter size: 20 G Hand hygiene performed  and maximum sterile barriers used   Attempts: 2 Procedure performed without using ultrasound guided technique. Following insertion, dressing applied and Biopatch. Post procedure assessment: normal  Patient tolerated the procedure well with no immediate complications.

## 2020-07-18 NOTE — Progress Notes (Signed)
  Echocardiogram Echocardiogram Transesophageal has been performed.  Jason Nielsen 07/18/2020, 8:39 AM

## 2020-07-18 NOTE — Op Note (Signed)
DATE OF SERVICE: 07/18/2020  PATIENT:  Jason Nielsen  80 y.o. 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  TOURNIQUET:  none  PATIENT DISPOSITION:  PACU - hemodynamically stable.   Delay start of Pharmacological VTE agent (>24hrs) due to surgical blood loss or risk of bleeding: no  INDICATION FOR PROCEDURE: Jason Nielsen is a 80 y.o. 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  The thrombus is in difficult position to access surgically.  Complicating the matter is the scar tissue from the right nephrectomy.  In an effort to try to reduce the thrombus burden minimally invasively, I offered the patient a attempt at endovascular thrombectomy. The patient understood and wished to proceed.  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  Inari FlowTriever and Geneticist, molecular devices were not able to engage the thrombus or perform adequate thrombectomy.  DESCRIPTION OF PROCEDURE: After identification of the patient in the pre-operative holding area, the patient was transferred to the operating room. The patient was positioned supine on the operating room table. Anesthesia was induced. The groins was prepped and draped in standard fashion. A surgical pause was performed confirming correct patient,  procedure, and operative location.  Using intraoperative ultrasound the vascular structures of the right groin were imaged.  Using micropuncture technique I accessed the common femoral vein above the femoral head.  The micro sheath was introduced into the vein and a Bentson wire advanced into the superior vena cava.  Over the wire the venotomy was dilated using a 8 [place] sheath.  I then introduced a 24 French dry seal sheath into the peripheral vena cava.  A intravascular ultrasound catheter was advanced into the vein and intravascular ultrasound was performed confirming the span of the thrombus from about T10 to about L1.  The patient was systemically heparinized.  The Inari FlowTreiver system was prepped and brought on the field.  I engaged the device into the clot and tried multiple aspiration attempts.  No clot returned.  We swapped systems for the Inari clot Treiber system.  A 035 Glidewire advantage was advanced into the jugular vein.  The basket of the device was advanced above the level of thrombus and slowly withdrawn using standard technique to try to engage the thrombus.  I was not able to retrieve any thrombus despite multiple passes.  We elected to end the case here.  Endovascular equipment was removed.  I put a U stitch of 3-0 nylon about the access site and placed a red rubber catheter as a bolster to achieve hemostasis in the groin.  This was secured down and hemostasis was achieved.  Upon completion of the case instrument and sharps counts were confirmed correct. The patient was transferred to the PACU in good condition. I was present for all portions of the procedure.  Yevonne Aline. Stanford Breed, MD [facility] Phone Number: [phone] 07/18/2020 9:26 AM

## 2020-07-22 ENCOUNTER — Ambulatory Visit (INDEPENDENT_AMBULATORY_CARE_PROVIDER_SITE_OTHER): Payer: Medicare Other | Admitting: Vascular Surgery

## 2020-07-22 ENCOUNTER — Other Ambulatory Visit: Payer: Self-pay

## 2020-07-22 ENCOUNTER — Encounter: Payer: Self-pay | Admitting: Vascular Surgery

## 2020-07-22 VITALS — BP 157/68 | HR 80 | Temp 98.1°F | Resp 16 | Ht 62.5 in | Wt 162.0 lb

## 2020-07-22 DIAGNOSIS — M7989 Other specified soft tissue disorders: Secondary | ICD-10-CM

## 2020-07-22 DIAGNOSIS — C641 Malignant neoplasm of right kidney, except renal pelvis: Secondary | ICD-10-CM

## 2020-07-22 NOTE — Progress Notes (Signed)
[facility] OF [place] PROGRESS NOTE  ASSESSMENT / PLAN: Devonne Lalani is a 80 y.o. 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  A minimally invasive attempt at thrombectomy was not successful.  I counseled the patient that removing the tumor thrombus in its entirety would be extremely morbid if it is densely adherent to the vena cava.  I will plan to refer the patient to [facility] for a second opinion to ensure there are no other options for treating this difficult problem.  Look forward to hearing their opinion and will continue to care for him locally if he chooses to pursue surgical therapy at [facility].  SUBJECTIVE: No complaints.  I reviewed the operative findings in detail with the patient.  Briefly, suction and mechanical thrombectomy of this tumor thrombus was not possible using endovascular methods.  He is here with his family today.    OBJECTIVE: BP (!) 157/68 (BP Location: Right Arm, Patient Position: Sitting, Cuff Size: Normal)   Pulse 80   Temp 98.1 F (36.7 C) (Temporal)   Resp 16   Ht 5' 2.5" (1.588 m)   Wt 162 lb (73.5 kg)   SpO2 96%   BMI 29.16 kg/m   No acute distress Regular rate and rhythm Unlabored Soft Right groin access site with some ecchymosis.  Bolster removed.  2+ edema to the lower extremities with some new rash and a distribution typical of venous insufficiency.  CBC Latest Ref Rng & Units 07/18/2020 07/15/2020 04/07/2020  WBC 4.0 - 10.5 K/uL - 5.6 -  Hemoglobin 13.0 - 17.0 g/dL 10.5(L) 11.3(L) 9.9(L)  Hematocrit 39.0 - 52.0 % 31.0(L) 35.3(L) 29.0(L)  Platelets 150 - 400 K/uL - 194 -     CMP Latest Ref Rng & Units 07/18/2020 07/15/2020 04/07/2020  Glucose 70 - 99 mg/dL 132(H) 127(H) 68(L)  BUN 8 - 23 mg/dL 43(H) 45(H) 47(H)  Creatinine 0.61 - 1.24 mg/dL 3.90(H) 3.61(H) 3.80(H)  Sodium 135 - 145 mmol/L 138 137 140  Potassium 3.5 - 5.1 mmol/L 3.5 5.1 4.3  Chloride 98 - 111 mmol/L 102 98  104  CO2 22 - 32 mmol/L - 26 -  Calcium 8.9 - 10.3 mg/dL - 9.4 -  Total Protein 6.0 - 8.3 g/dL - - -  Total Bilirubin 0.3 - 1.2 mg/dL - - -  Alkaline Phos 39 - 117 U/L - - -  AST 0 - 37 U/L - - -  ALT 0 - 53 U/L - - -    Estimated Creatinine Clearance: 13.2 mL/min (A) (by C-G formula based on SCr of 3.9 mg/dL (H)).  Yevonne Aline. Stanford Breed, MD [facility] Phone Number: [phone] 07/22/2020 4:31 PM

## 2020-07-25 LAB — ECHO INTRAOPERATIVE TEE
Height: 64 in
Weight: 2611.2 oz

## 2020-07-31 DIAGNOSIS — C641 Malignant neoplasm of right kidney, except renal pelvis: Secondary | ICD-10-CM | POA: Diagnosis not present

## 2020-08-01 ENCOUNTER — Telehealth: Payer: Self-pay

## 2020-08-01 NOTE — Telephone Encounter (Signed)
Call from pt/family. Reports went for second opinion at [facility] to discuss surgical options- stated they were told no other options other than what you advised. Pt stated he would rather have treatment locally at our office and are ready to proceed with next steps. Message sent to Dr. Stanford Breed to advise.

## 2020-08-07 NOTE — Telephone Encounter (Signed)
Pt scheduled for office visit on Aug 9 per Dr. Stanford Breed. Verbal permission given per pt to speak with daughter Tammy H. Who voiced understanding.

## 2020-08-11 NOTE — Progress Notes (Signed)
[facility] OF  PROGRESS NOTE  ASSESSMENT / PLAN: Jason Nielsen is a 80 y.o. 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  A minimally invasive attempt at thrombectomy was not successful.  I counseled the patient that removing the tumor thrombus in its entirety would be extremely morbid if it is densely adherent to the vena cava.  The patient is not interested in going to [facility] for further care.  I counseled the patient again about high risk of operation including death, disability, serious complication.  He understands these risks and wants to proceed with therapy at [facility].   My tentative operative plan is to do a right retroperitoneal incision to expose the vena cava from above the hepatic veins to the residual right renal vein.  I would then sequentially clamp the vena cava just before the right atrium and above the left renal vein.  We would need to totally isolate the liver's flow at the porta hepatis.  He would likely need venovenous bypass.  We then proceeded with caval replacement with a 20 mm externally supported PTFE graft.  To do this, we will need hepatobiliary and cardiothoracic assistance.  I will refer him to Dr. Zenia Resides and Dr. Kipp Brood.  I discussed the case briefly with Dr. Zenia Resides.  I share her reservations.  He would probably be best cared for at a teaching facility, but has so far only wanted his care done at [facility].  SUBJECTIVE: No complaints.  I reviewed the operative findings in detail with the patient.  Briefly, suction and mechanical thrombectomy of this tumor thrombus was not possible using endovascular methods.  He is here with his family today.    OBJECTIVE: BP 137/63 (BP Location: Right Arm, Patient Position: Sitting, Cuff Size: Normal)   Pulse 78   Temp 99.1 F (37.3 C)   Resp 20   Ht 5' 2.5" (1.588 m)   Wt 167 lb 3.2 oz (75.8 kg)   SpO2 99%   BMI 30.09 kg/m   No acute  distress Regular rate and rhythm Unlabored Soft Right groin access site with some ecchymosis.  Bolster removed.  2+ edema to the lower extremities with some new rash and a distribution typical of venous insufficiency.  CBC Latest Ref Rng & Units 07/18/2020 07/15/2020 04/07/2020  WBC 4.0 - 10.5 K/uL - 5.6 -  Hemoglobin 13.0 - 17.0 g/dL 10.5(L) 11.3(L) 9.9(L)  Hematocrit 39.0 - 52.0 % 31.0(L) 35.3(L) 29.0(L)  Platelets 150 - 400 K/uL - 194 -     CMP Latest Ref Rng & Units 07/18/2020 07/15/2020 04/07/2020  Glucose 70 - 99 mg/dL 132(H) 127(H) 68(L)  BUN 8 - 23 mg/dL 43(H) 45(H) 47(H)  Creatinine 0.61 - 1.24 mg/dL 3.90(H) 3.61(H) 3.80(H)  Sodium 135 - 145 mmol/L 138 137 140  Potassium 3.5 - 5.1 mmol/L 3.5 5.1 4.3  Chloride 98 - 111 mmol/L 102 98 104  CO2 22 - 32 mmol/L - 26 -  Calcium 8.9 - 10.3 mg/dL - 9.4 -  Total Protein 6.0 - 8.3 g/dL - - -  Total Bilirubin 0.3 - 1.2 mg/dL - - -  Alkaline Phos 39 - 117 U/L - - -  AST 0 - 37 U/L - - -  ALT 0 - 53 U/L - - -    CrCl cannot be calculated (Patient's most recent lab result is older than the maximum 21 days allowed.).  Yevonne Aline. Stanford Breed, MD [facility]  [facility] Office Phone Number: [phone] 08/12/2020 11:47 AM

## 2020-08-12 ENCOUNTER — Other Ambulatory Visit: Payer: Self-pay

## 2020-08-12 ENCOUNTER — Encounter: Payer: Self-pay | Admitting: Vascular Surgery

## 2020-08-12 ENCOUNTER — Ambulatory Visit (INDEPENDENT_AMBULATORY_CARE_PROVIDER_SITE_OTHER): Payer: Medicare Other | Admitting: Vascular Surgery

## 2020-08-12 VITALS — BP 137/63 | HR 78 | Temp 99.1°F | Resp 20 | Ht 62.5 in | Wt 167.2 lb

## 2020-08-12 DIAGNOSIS — I82220 Acute embolism and thrombosis of inferior vena cava: Secondary | ICD-10-CM

## 2020-08-12 DIAGNOSIS — D49511 Neoplasm of unspecified behavior of right kidney: Secondary | ICD-10-CM

## 2020-08-21 ENCOUNTER — Other Ambulatory Visit: Payer: Self-pay | Admitting: Urology

## 2020-08-21 DIAGNOSIS — C641 Malignant neoplasm of right kidney, except renal pelvis: Secondary | ICD-10-CM

## 2020-08-21 DIAGNOSIS — C649 Malignant neoplasm of unspecified kidney, except renal pelvis: Secondary | ICD-10-CM

## 2020-08-27 DIAGNOSIS — D49511 Neoplasm of unspecified behavior of right kidney: Secondary | ICD-10-CM | POA: Diagnosis not present

## 2020-08-27 DIAGNOSIS — C641 Malignant neoplasm of right kidney, except renal pelvis: Secondary | ICD-10-CM | POA: Diagnosis not present

## 2020-08-27 DIAGNOSIS — I82220 Acute embolism and thrombosis of inferior vena cava: Secondary | ICD-10-CM | POA: Diagnosis not present

## 2020-08-28 DIAGNOSIS — E872 Acidosis: Secondary | ICD-10-CM | POA: Diagnosis not present

## 2020-08-28 DIAGNOSIS — I82220 Acute embolism and thrombosis of inferior vena cava: Secondary | ICD-10-CM | POA: Diagnosis not present

## 2020-08-28 DIAGNOSIS — D49511 Neoplasm of unspecified behavior of right kidney: Secondary | ICD-10-CM | POA: Diagnosis not present

## 2020-08-28 DIAGNOSIS — E1122 Type 2 diabetes mellitus with diabetic chronic kidney disease: Secondary | ICD-10-CM | POA: Diagnosis not present

## 2020-08-28 DIAGNOSIS — I770 Arteriovenous fistula, acquired: Secondary | ICD-10-CM | POA: Diagnosis not present

## 2020-08-28 DIAGNOSIS — N189 Chronic kidney disease, unspecified: Secondary | ICD-10-CM | POA: Diagnosis not present

## 2020-08-28 DIAGNOSIS — I129 Hypertensive chronic kidney disease with stage 1 through stage 4 chronic kidney disease, or unspecified chronic kidney disease: Secondary | ICD-10-CM | POA: Diagnosis not present

## 2020-08-28 DIAGNOSIS — N2581 Secondary hyperparathyroidism of renal origin: Secondary | ICD-10-CM | POA: Diagnosis not present

## 2020-08-28 DIAGNOSIS — N184 Chronic kidney disease, stage 4 (severe): Secondary | ICD-10-CM | POA: Diagnosis not present

## 2020-08-28 DIAGNOSIS — N2889 Other specified disorders of kidney and ureter: Secondary | ICD-10-CM | POA: Diagnosis not present

## 2020-08-28 DIAGNOSIS — D631 Anemia in chronic kidney disease: Secondary | ICD-10-CM | POA: Diagnosis not present

## 2020-08-28 DIAGNOSIS — Z905 Acquired absence of kidney: Secondary | ICD-10-CM | POA: Diagnosis not present

## 2020-09-04 ENCOUNTER — Telehealth: Payer: Self-pay | Admitting: Oncology

## 2020-09-04 DIAGNOSIS — H40013 Open angle with borderline findings, low risk, bilateral: Secondary | ICD-10-CM | POA: Diagnosis not present

## 2020-09-04 DIAGNOSIS — H40033 Anatomical narrow angle, bilateral: Secondary | ICD-10-CM | POA: Diagnosis not present

## 2020-09-04 NOTE — Telephone Encounter (Signed)
Scheduled appt per 8/26 referral. Pt and pt's daughter are aware of appt date and time.

## 2020-09-10 NOTE — Progress Notes (Signed)
[address]       [place] [address]             [phone]                    Prakash Wren Barry Medical Record #[id number] Date of Birth: January 10, 1940  Referring: Cherre Robins, MD Primary Care: Ginger Organ., MD Primary Cardiologist: Buford Dresser, MD  Chief Complaint:    Chief Complaint  Patient presents with   Consult    History of Present Illness:    Jason Nielsen 80 y.o. male presents for surgical evaluation of IVC tumor thrombus.  In February of this year he underwent a laparoscopic nephrectomy but unfortunately had residual tumor thrombus in the IVC.  Dr. Stanford Breed attempted an endovascular mechanical thrombectomy, but this was unsuccessful.  The tumor thrombus is located in the retrohepatic cava.  The patient states that overall he is doing well.  He denies any symptoms.  There is no lower extremity swelling, abdominal pain or shortness of breath.  Past Medical History:  Diagnosis Date   Anemia    Arthritis    Chronic kidney disease    Diabetes mellitus without complication (HCC)    HLD (hyperlipidemia)    Hypertension    Prostate cancer ([place])    Renal cancer ([place])    Right   Stroke [facility])     Past Surgical History:  Procedure Laterality Date   AV FISTULA PLACEMENT Left 02/07/2020   Procedure: LEFT UPPER EXTREMITY FIRST STAGE BRACHIO-BASILIC ARTERIOVENOUS (AV) FISTULA CREATION;  Surgeon: Cherre Robins, MD;  Location: [place];  Service: Vascular;  Laterality: Left;  PERIPHERAL NERVE BLOCK   [place] Left 04/07/2020   Procedure: LEFT ARM SECOND STAGE [place];  Surgeon: Cherre Robins, MD;  Location: [place];  Service: Vascular;  Laterality: Left;   CYSTOSCOPY  02/11/2020   Procedure: FLEXIBLE CYSTOSCOPY, URETHERAL DILITATION FOR URETHERAL [place];  Surgeon: Raynelle Bring, MD;  Location: [facility];  Service: Urology;;   LAPAROSCOPIC  NEPHRECTOMY Right 02/11/2020   Procedure: LAPAROSCOPIC RADICAL NEPHRECTOMY;  Surgeon: Raynelle Bring, MD;  Location: [facility];  Service: Urology;  Laterality: Right;   PROSTATE BIOPSY     RENAL BIOPSY     TRANSPERINEAL IMPLANT OF RADIATION SEEDS W/ ULTRASOUND      Family History  Problem Relation Age of Onset   Heart attack Brother    Stroke Brother    Stroke Brother      Social History   Tobacco Use  Smoking Status Former   Types: Cigars   Quit date: 1980   Years since quitting: 42.7  Smokeless Tobacco Never  Tobacco Comments   Quit 40 years ago    Social History   Substance and Sexual Activity  Alcohol Use Not Currently   Comment: none >25 years     Allergies  Allergen Reactions   Ezetimibe     Other reaction(s): muscle weakness   Metformin Hcl Diarrhea   Pioglitazone Swelling    Current Outpatient Medications  Medication Sig Dispense Refill   allopurinol (ZYLOPRIM) 100 MG tablet Take 100 mg by mouth daily.     amLODipine (NORVASC) 10 MG tablet Take 10 mg by mouth daily.     apixaban (ELIQUIS) 2.5 MG TABS tablet Take 1 tablet (2.5 mg total) by mouth 2 (two) times daily. 60 tablet 3   atorvastatin (LIPITOR) 40 MG tablet  Take 40 mg by mouth daily.     BD PEN NEEDLE NANO 2ND GEN 32G X 4 MM MISC USE WITH INSULIN DAILY     carvedilol (COREG) 25 MG tablet Take 25 mg by mouth 2 (two) times daily.     docusate sodium (COLACE) 100 MG capsule Take 1 capsule (100 mg total) by mouth 2 (two) times daily. 60 capsule 2   ferrous sulfate 325 (65 FE) MG tablet Take 325 mg by mouth daily.     furosemide (LASIX) 40 MG tablet Take 40 mg by mouth 2 (two) times daily.     OneTouch Delica Lancets 96V MISC USE LANCET TO CHECK BLOOD GLUCOSE ONCE A DAY DX: E11.29     ONETOUCH VERIO test strip daily.     sodium bicarbonate 650 MG tablet Take 1,300 mg by mouth 3 (three) times daily.     TRESIBA FLEXTOUCH 200 UNIT/ML FlexTouch Pen Inject 18 Units into the skin daily.     No current  facility-administered medications for this visit.    Review of Systems  Constitutional: Negative.   Respiratory: Negative.    Cardiovascular: Negative.   Gastrointestinal: Negative.    PHYSICAL EXAMINATION: BP (!) 153/62 (BP Location: Right Arm, Patient Position: Sitting)   Pulse 76   Resp 20   Ht 5\' 2"  (1.575 m)   Wt 170 lb (77.1 kg)   SpO2 93% Comment: RA  BMI 31.09 kg/m   Physical Exam Constitutional:      General: He is not in acute distress.    Appearance: Normal appearance. He is not ill-appearing.  HENT:     Head: Normocephalic and atraumatic.  Cardiovascular:     Rate and Rhythm: Normal rate.  Pulmonary:     Effort: Pulmonary effort is normal. No respiratory distress.  Musculoskeletal:        General: Normal range of motion.     Cervical back: Normal range of motion.  Skin:    General: Skin is warm and dry.  Neurological:     General: No focal deficit present.     Mental Status: He is alert and oriented to person, place, and time.     Diagnostic Studies & Laboratory data:     Recent Radiology Findings:   No results found.     I have independently reviewed the above radiology studies  and reviewed the findings with the patient.   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  His case has been discussed at length with Dr. Stanford Breed, and I agree that he would be extremely high risk given  his age and the complexity of the operation.  Additionally the most part is asymptomatic and doing well.  I recommended that he obtain a second opinion, likely at [facility] or [facility], I stressed again that  this would be a operation and his risk of mortality will be exceedingly high.  The family was appreciative of the consultation will take some time to think about this information.      Lajuana Matte 09/12/2020 5:23 PM

## 2020-09-12 ENCOUNTER — Other Ambulatory Visit: Payer: Self-pay | Admitting: Vascular Surgery

## 2020-09-12 ENCOUNTER — Encounter: Payer: Self-pay | Admitting: Thoracic Surgery (Cardiothoracic Vascular Surgery)

## 2020-09-12 ENCOUNTER — Inpatient Hospital Stay: Payer: PRIVATE HEALTH INSURANCE | Admitting: Oncology

## 2020-09-12 ENCOUNTER — Other Ambulatory Visit: Payer: Self-pay

## 2020-09-12 ENCOUNTER — Telehealth: Payer: Self-pay | Admitting: *Deleted

## 2020-09-12 ENCOUNTER — Institutional Professional Consult (permissible substitution) (INDEPENDENT_AMBULATORY_CARE_PROVIDER_SITE_OTHER): Payer: Medicare Other | Admitting: Thoracic Surgery (Cardiothoracic Vascular Surgery)

## 2020-09-12 VITALS — BP 153/62 | HR 76 | Resp 20 | Ht 62.0 in | Wt 170.0 lb

## 2020-09-12 DIAGNOSIS — C641 Malignant neoplasm of right kidney, except renal pelvis: Secondary | ICD-10-CM | POA: Diagnosis not present

## 2020-09-12 DIAGNOSIS — I82220 Acute embolism and thrombosis of inferior vena cava: Secondary | ICD-10-CM

## 2020-09-12 DIAGNOSIS — D49511 Neoplasm of unspecified behavior of right kidney: Secondary | ICD-10-CM | POA: Diagnosis not present

## 2020-09-12 NOTE — Telephone Encounter (Signed)
Patient did not come to New Patient appt today. Contacted patient, spoke with patient/wife and daughter. Per daughter, patient did not want to come to today's appt and will call to reschedule when he has some other things settled. Daughter said she had called earlier today and left cancellation message on scheduling voice mail. Dr. Alen Blew informed

## 2020-09-13 ENCOUNTER — Ambulatory Visit
Admission: RE | Admit: 2020-09-13 | Discharge: 2020-09-13 | Disposition: A | Discharge status: Home / Self Care | Payer: Medicare Other | Source: Ambulatory Visit | Attending: Urology | Admitting: Urology

## 2020-09-13 DIAGNOSIS — K5730 Diverticulosis of large intestine without perforation or abscess without bleeding: Secondary | ICD-10-CM | POA: Diagnosis not present

## 2020-09-13 DIAGNOSIS — C641 Malignant neoplasm of right kidney, except renal pelvis: Secondary | ICD-10-CM

## 2020-09-13 DIAGNOSIS — K7689 Other specified diseases of liver: Secondary | ICD-10-CM | POA: Diagnosis not present

## 2020-09-13 DIAGNOSIS — D490 Neoplasm of unspecified behavior of digestive system: Secondary | ICD-10-CM | POA: Diagnosis not present

## 2020-09-13 DIAGNOSIS — D3500 Benign neoplasm of unspecified adrenal gland: Secondary | ICD-10-CM | POA: Diagnosis not present

## 2020-09-13 MED ORDER — GADOBENATE DIMEGLUMINE 529 MG/ML IV SOLN
15.0000 mL | Freq: Once | INTRAVENOUS | Status: AC | PRN
  Administered 2020-09-13: 15 mL via INTRAVENOUS

## 2020-09-16 ENCOUNTER — Ambulatory Visit
Admission: RE | Admit: 2020-09-16 | Discharge: 2020-09-16 | Disposition: A | Discharge status: Home / Self Care | Payer: Medicare Other | Source: Ambulatory Visit | Attending: Urology | Admitting: Urology

## 2020-09-16 ENCOUNTER — Other Ambulatory Visit: Payer: Self-pay

## 2020-09-16 DIAGNOSIS — I700 Atherosclerosis of aorta: Secondary | ICD-10-CM | POA: Diagnosis not present

## 2020-09-16 DIAGNOSIS — R911 Solitary pulmonary nodule: Secondary | ICD-10-CM | POA: Diagnosis not present

## 2020-09-16 DIAGNOSIS — C641 Malignant neoplasm of right kidney, except renal pelvis: Secondary | ICD-10-CM

## 2020-09-17 DIAGNOSIS — I2510 Atherosclerotic heart disease of native coronary artery without angina pectoris: Secondary | ICD-10-CM | POA: Diagnosis not present

## 2020-09-17 DIAGNOSIS — I82220 Acute embolism and thrombosis of inferior vena cava: Secondary | ICD-10-CM | POA: Diagnosis not present

## 2020-09-17 DIAGNOSIS — Z8673 Personal history of transient ischemic attack (TIA), and cerebral infarction without residual deficits: Secondary | ICD-10-CM | POA: Diagnosis not present

## 2020-09-17 DIAGNOSIS — C641 Malignant neoplasm of right kidney, except renal pelvis: Secondary | ICD-10-CM | POA: Diagnosis not present

## 2020-09-17 DIAGNOSIS — I10 Essential (primary) hypertension: Secondary | ICD-10-CM | POA: Diagnosis not present

## 2020-09-26 DIAGNOSIS — I82220 Acute embolism and thrombosis of inferior vena cava: Secondary | ICD-10-CM | POA: Diagnosis not present

## 2020-09-26 DIAGNOSIS — I10 Essential (primary) hypertension: Secondary | ICD-10-CM | POA: Diagnosis not present

## 2020-09-26 DIAGNOSIS — I2510 Atherosclerotic heart disease of native coronary artery without angina pectoris: Secondary | ICD-10-CM | POA: Diagnosis not present

## 2020-09-26 DIAGNOSIS — Z8673 Personal history of transient ischemic attack (TIA), and cerebral infarction without residual deficits: Secondary | ICD-10-CM | POA: Diagnosis not present

## 2020-09-26 DIAGNOSIS — Z01818 Encounter for other preprocedural examination: Secondary | ICD-10-CM | POA: Diagnosis not present

## 2020-09-26 DIAGNOSIS — C641 Malignant neoplasm of right kidney, except renal pelvis: Secondary | ICD-10-CM | POA: Diagnosis not present

## 2020-09-28 DIAGNOSIS — C641 Malignant neoplasm of right kidney, except renal pelvis: Secondary | ICD-10-CM | POA: Diagnosis not present

## 2020-10-01 DIAGNOSIS — I82220 Acute embolism and thrombosis of inferior vena cava: Secondary | ICD-10-CM | POA: Diagnosis not present

## 2020-10-01 DIAGNOSIS — R748 Abnormal levels of other serum enzymes: Secondary | ICD-10-CM | POA: Diagnosis not present

## 2020-10-01 DIAGNOSIS — Z87891 Personal history of nicotine dependence: Secondary | ICD-10-CM | POA: Diagnosis not present

## 2020-10-01 DIAGNOSIS — C641 Malignant neoplasm of right kidney, except renal pelvis: Secondary | ICD-10-CM | POA: Diagnosis not present

## 2020-10-01 DIAGNOSIS — Z905 Acquired absence of kidney: Secondary | ICD-10-CM | POA: Diagnosis not present

## 2020-10-01 DIAGNOSIS — Z85528 Personal history of other malignant neoplasm of kidney: Secondary | ICD-10-CM | POA: Diagnosis not present

## 2020-10-01 DIAGNOSIS — I120 Hypertensive chronic kidney disease with stage 5 chronic kidney disease or end stage renal disease: Secondary | ICD-10-CM | POA: Diagnosis not present

## 2020-10-01 DIAGNOSIS — N186 End stage renal disease: Secondary | ICD-10-CM | POA: Diagnosis not present

## 2020-10-01 DIAGNOSIS — E1122 Type 2 diabetes mellitus with diabetic chronic kidney disease: Secondary | ICD-10-CM | POA: Diagnosis not present

## 2020-10-10 DIAGNOSIS — I82220 Acute embolism and thrombosis of inferior vena cava: Secondary | ICD-10-CM | POA: Diagnosis not present

## 2020-10-10 DIAGNOSIS — Z794 Long term (current) use of insulin: Secondary | ICD-10-CM | POA: Diagnosis not present

## 2020-10-10 DIAGNOSIS — C7989 Secondary malignant neoplasm of other specified sites: Secondary | ICD-10-CM | POA: Diagnosis not present

## 2020-10-10 DIAGNOSIS — Z23 Encounter for immunization: Secondary | ICD-10-CM | POA: Diagnosis not present

## 2020-10-10 DIAGNOSIS — I770 Arteriovenous fistula, acquired: Secondary | ICD-10-CM | POA: Diagnosis not present

## 2020-10-10 DIAGNOSIS — N184 Chronic kidney disease, stage 4 (severe): Secondary | ICD-10-CM | POA: Diagnosis not present

## 2020-10-10 DIAGNOSIS — I129 Hypertensive chronic kidney disease with stage 1 through stage 4 chronic kidney disease, or unspecified chronic kidney disease: Secondary | ICD-10-CM | POA: Diagnosis not present

## 2020-10-10 DIAGNOSIS — D649 Anemia, unspecified: Secondary | ICD-10-CM | POA: Diagnosis not present

## 2020-10-10 DIAGNOSIS — E1139 Type 2 diabetes mellitus with other diabetic ophthalmic complication: Secondary | ICD-10-CM | POA: Diagnosis not present

## 2020-10-10 DIAGNOSIS — I69954 Hemiplegia and hemiparesis following unspecified cerebrovascular disease affecting left non-dominant side: Secondary | ICD-10-CM | POA: Diagnosis not present

## 2020-10-10 DIAGNOSIS — C641 Malignant neoplasm of right kidney, except renal pelvis: Secondary | ICD-10-CM | POA: Diagnosis not present

## 2020-10-10 DIAGNOSIS — H3500 Unspecified background retinopathy: Secondary | ICD-10-CM | POA: Diagnosis not present

## 2020-10-10 DIAGNOSIS — E785 Hyperlipidemia, unspecified: Secondary | ICD-10-CM | POA: Diagnosis not present

## 2020-10-29 DIAGNOSIS — Z87891 Personal history of nicotine dependence: Secondary | ICD-10-CM | POA: Diagnosis not present

## 2020-10-29 DIAGNOSIS — R600 Localized edema: Secondary | ICD-10-CM | POA: Diagnosis not present

## 2020-10-29 DIAGNOSIS — Z79899 Other long term (current) drug therapy: Secondary | ICD-10-CM | POA: Diagnosis not present

## 2020-10-29 DIAGNOSIS — Z905 Acquired absence of kidney: Secondary | ICD-10-CM | POA: Diagnosis not present

## 2020-10-29 DIAGNOSIS — Z5111 Encounter for antineoplastic chemotherapy: Secondary | ICD-10-CM | POA: Diagnosis not present

## 2020-10-29 DIAGNOSIS — Z5112 Encounter for antineoplastic immunotherapy: Secondary | ICD-10-CM | POA: Diagnosis not present

## 2020-10-29 DIAGNOSIS — I82220 Acute embolism and thrombosis of inferior vena cava: Secondary | ICD-10-CM | POA: Diagnosis not present

## 2020-10-29 DIAGNOSIS — C641 Malignant neoplasm of right kidney, except renal pelvis: Secondary | ICD-10-CM | POA: Diagnosis not present

## 2020-11-06 DIAGNOSIS — Z23 Encounter for immunization: Secondary | ICD-10-CM | POA: Diagnosis not present

## 2020-11-19 DIAGNOSIS — R5383 Other fatigue: Secondary | ICD-10-CM | POA: Diagnosis not present

## 2020-11-19 DIAGNOSIS — Z905 Acquired absence of kidney: Secondary | ICD-10-CM | POA: Diagnosis not present

## 2020-11-19 DIAGNOSIS — Z87891 Personal history of nicotine dependence: Secondary | ICD-10-CM | POA: Diagnosis not present

## 2020-11-19 DIAGNOSIS — C641 Malignant neoplasm of right kidney, except renal pelvis: Secondary | ICD-10-CM | POA: Diagnosis not present

## 2020-11-19 DIAGNOSIS — R739 Hyperglycemia, unspecified: Secondary | ICD-10-CM | POA: Diagnosis not present

## 2020-11-19 DIAGNOSIS — R351 Nocturia: Secondary | ICD-10-CM | POA: Diagnosis not present

## 2020-11-19 DIAGNOSIS — R600 Localized edema: Secondary | ICD-10-CM | POA: Diagnosis not present

## 2020-11-19 DIAGNOSIS — Z5112 Encounter for antineoplastic immunotherapy: Secondary | ICD-10-CM | POA: Diagnosis not present

## 2020-11-19 DIAGNOSIS — Z79899 Other long term (current) drug therapy: Secondary | ICD-10-CM | POA: Diagnosis not present

## 2020-11-19 DIAGNOSIS — I82220 Acute embolism and thrombosis of inferior vena cava: Secondary | ICD-10-CM | POA: Diagnosis not present

## 2020-12-03 DIAGNOSIS — D631 Anemia in chronic kidney disease: Secondary | ICD-10-CM | POA: Diagnosis not present

## 2020-12-03 DIAGNOSIS — D49511 Neoplasm of unspecified behavior of right kidney: Secondary | ICD-10-CM | POA: Diagnosis not present

## 2020-12-03 DIAGNOSIS — R609 Edema, unspecified: Secondary | ICD-10-CM | POA: Diagnosis not present

## 2020-12-03 DIAGNOSIS — N185 Chronic kidney disease, stage 5: Secondary | ICD-10-CM | POA: Diagnosis not present

## 2020-12-03 DIAGNOSIS — I120 Hypertensive chronic kidney disease with stage 5 chronic kidney disease or end stage renal disease: Secondary | ICD-10-CM | POA: Diagnosis not present

## 2020-12-03 DIAGNOSIS — N2581 Secondary hyperparathyroidism of renal origin: Secondary | ICD-10-CM | POA: Diagnosis not present

## 2020-12-03 DIAGNOSIS — I82220 Acute embolism and thrombosis of inferior vena cava: Secondary | ICD-10-CM | POA: Diagnosis not present

## 2020-12-03 DIAGNOSIS — N184 Chronic kidney disease, stage 4 (severe): Secondary | ICD-10-CM | POA: Diagnosis not present

## 2020-12-03 DIAGNOSIS — E872 Acidosis, unspecified: Secondary | ICD-10-CM | POA: Diagnosis not present

## 2020-12-04 DIAGNOSIS — C649 Malignant neoplasm of unspecified kidney, except renal pelvis: Secondary | ICD-10-CM | POA: Diagnosis not present

## 2020-12-04 DIAGNOSIS — N186 End stage renal disease: Secondary | ICD-10-CM | POA: Diagnosis not present

## 2020-12-04 DIAGNOSIS — Z992 Dependence on renal dialysis: Secondary | ICD-10-CM | POA: Diagnosis not present

## 2020-12-10 DIAGNOSIS — Z905 Acquired absence of kidney: Secondary | ICD-10-CM | POA: Diagnosis not present

## 2020-12-10 DIAGNOSIS — R609 Edema, unspecified: Secondary | ICD-10-CM | POA: Diagnosis not present

## 2020-12-10 DIAGNOSIS — R21 Rash and other nonspecific skin eruption: Secondary | ICD-10-CM | POA: Diagnosis not present

## 2020-12-10 DIAGNOSIS — Z5112 Encounter for antineoplastic immunotherapy: Secondary | ICD-10-CM | POA: Diagnosis not present

## 2020-12-10 DIAGNOSIS — R530 Neoplastic (malignant) related fatigue: Secondary | ICD-10-CM | POA: Diagnosis not present

## 2020-12-10 DIAGNOSIS — C641 Malignant neoplasm of right kidney, except renal pelvis: Secondary | ICD-10-CM | POA: Diagnosis not present

## 2020-12-10 DIAGNOSIS — Z87891 Personal history of nicotine dependence: Secondary | ICD-10-CM | POA: Diagnosis not present

## 2020-12-10 DIAGNOSIS — R739 Hyperglycemia, unspecified: Secondary | ICD-10-CM | POA: Diagnosis not present

## 2020-12-10 DIAGNOSIS — I82220 Acute embolism and thrombosis of inferior vena cava: Secondary | ICD-10-CM | POA: Diagnosis not present

## 2020-12-10 DIAGNOSIS — R197 Diarrhea, unspecified: Secondary | ICD-10-CM | POA: Diagnosis not present

## 2020-12-15 DIAGNOSIS — E8779 Other fluid overload: Secondary | ICD-10-CM | POA: Diagnosis not present

## 2020-12-15 DIAGNOSIS — D631 Anemia in chronic kidney disease: Secondary | ICD-10-CM | POA: Diagnosis not present

## 2020-12-15 DIAGNOSIS — Z992 Dependence on renal dialysis: Secondary | ICD-10-CM | POA: Diagnosis not present

## 2020-12-15 DIAGNOSIS — D509 Iron deficiency anemia, unspecified: Secondary | ICD-10-CM | POA: Diagnosis not present

## 2020-12-15 DIAGNOSIS — N2581 Secondary hyperparathyroidism of renal origin: Secondary | ICD-10-CM | POA: Diagnosis not present

## 2020-12-15 DIAGNOSIS — N186 End stage renal disease: Secondary | ICD-10-CM | POA: Diagnosis not present

## 2020-12-15 DIAGNOSIS — E875 Hyperkalemia: Secondary | ICD-10-CM | POA: Diagnosis not present

## 2020-12-16 DIAGNOSIS — N186 End stage renal disease: Secondary | ICD-10-CM | POA: Diagnosis not present

## 2020-12-16 DIAGNOSIS — N2581 Secondary hyperparathyroidism of renal origin: Secondary | ICD-10-CM | POA: Diagnosis not present

## 2020-12-16 DIAGNOSIS — D509 Iron deficiency anemia, unspecified: Secondary | ICD-10-CM | POA: Diagnosis not present

## 2020-12-16 DIAGNOSIS — E8779 Other fluid overload: Secondary | ICD-10-CM | POA: Diagnosis not present

## 2020-12-16 DIAGNOSIS — D631 Anemia in chronic kidney disease: Secondary | ICD-10-CM | POA: Diagnosis not present

## 2020-12-16 DIAGNOSIS — Z992 Dependence on renal dialysis: Secondary | ICD-10-CM | POA: Diagnosis not present

## 2020-12-18 DIAGNOSIS — N2581 Secondary hyperparathyroidism of renal origin: Secondary | ICD-10-CM | POA: Diagnosis not present

## 2020-12-18 DIAGNOSIS — N186 End stage renal disease: Secondary | ICD-10-CM | POA: Diagnosis not present

## 2020-12-18 DIAGNOSIS — Z992 Dependence on renal dialysis: Secondary | ICD-10-CM | POA: Diagnosis not present

## 2020-12-18 DIAGNOSIS — E8779 Other fluid overload: Secondary | ICD-10-CM | POA: Diagnosis not present

## 2020-12-18 DIAGNOSIS — D631 Anemia in chronic kidney disease: Secondary | ICD-10-CM | POA: Diagnosis not present

## 2020-12-18 DIAGNOSIS — D509 Iron deficiency anemia, unspecified: Secondary | ICD-10-CM | POA: Diagnosis not present

## 2020-12-19 DIAGNOSIS — N186 End stage renal disease: Secondary | ICD-10-CM | POA: Diagnosis not present

## 2020-12-19 DIAGNOSIS — E8779 Other fluid overload: Secondary | ICD-10-CM | POA: Diagnosis not present

## 2020-12-19 DIAGNOSIS — Z992 Dependence on renal dialysis: Secondary | ICD-10-CM | POA: Diagnosis not present

## 2020-12-19 DIAGNOSIS — D631 Anemia in chronic kidney disease: Secondary | ICD-10-CM | POA: Diagnosis not present

## 2020-12-19 DIAGNOSIS — D509 Iron deficiency anemia, unspecified: Secondary | ICD-10-CM | POA: Diagnosis not present

## 2020-12-19 DIAGNOSIS — N2581 Secondary hyperparathyroidism of renal origin: Secondary | ICD-10-CM | POA: Diagnosis not present

## 2020-12-22 DIAGNOSIS — Z992 Dependence on renal dialysis: Secondary | ICD-10-CM | POA: Diagnosis not present

## 2020-12-22 DIAGNOSIS — N186 End stage renal disease: Secondary | ICD-10-CM | POA: Diagnosis not present

## 2020-12-22 DIAGNOSIS — N2581 Secondary hyperparathyroidism of renal origin: Secondary | ICD-10-CM | POA: Diagnosis not present

## 2020-12-22 DIAGNOSIS — D509 Iron deficiency anemia, unspecified: Secondary | ICD-10-CM | POA: Diagnosis not present

## 2020-12-22 DIAGNOSIS — E8779 Other fluid overload: Secondary | ICD-10-CM | POA: Diagnosis not present

## 2020-12-22 DIAGNOSIS — D631 Anemia in chronic kidney disease: Secondary | ICD-10-CM | POA: Diagnosis not present

## 2020-12-23 DIAGNOSIS — N2581 Secondary hyperparathyroidism of renal origin: Secondary | ICD-10-CM | POA: Diagnosis not present

## 2020-12-23 DIAGNOSIS — D509 Iron deficiency anemia, unspecified: Secondary | ICD-10-CM | POA: Diagnosis not present

## 2020-12-23 DIAGNOSIS — N186 End stage renal disease: Secondary | ICD-10-CM | POA: Diagnosis not present

## 2020-12-23 DIAGNOSIS — Z992 Dependence on renal dialysis: Secondary | ICD-10-CM | POA: Diagnosis not present

## 2020-12-23 DIAGNOSIS — E8779 Other fluid overload: Secondary | ICD-10-CM | POA: Diagnosis not present

## 2020-12-23 DIAGNOSIS — D631 Anemia in chronic kidney disease: Secondary | ICD-10-CM | POA: Diagnosis not present

## 2020-12-24 DIAGNOSIS — Z992 Dependence on renal dialysis: Secondary | ICD-10-CM | POA: Diagnosis not present

## 2020-12-24 DIAGNOSIS — N186 End stage renal disease: Secondary | ICD-10-CM | POA: Diagnosis not present

## 2020-12-24 DIAGNOSIS — T82898A Other specified complication of vascular prosthetic devices, implants and grafts, initial encounter: Secondary | ICD-10-CM | POA: Diagnosis not present

## 2020-12-25 DIAGNOSIS — D509 Iron deficiency anemia, unspecified: Secondary | ICD-10-CM | POA: Diagnosis not present

## 2020-12-25 DIAGNOSIS — Z992 Dependence on renal dialysis: Secondary | ICD-10-CM | POA: Diagnosis not present

## 2020-12-25 DIAGNOSIS — N2581 Secondary hyperparathyroidism of renal origin: Secondary | ICD-10-CM | POA: Diagnosis not present

## 2020-12-25 DIAGNOSIS — N186 End stage renal disease: Secondary | ICD-10-CM | POA: Diagnosis not present

## 2020-12-25 DIAGNOSIS — D631 Anemia in chronic kidney disease: Secondary | ICD-10-CM | POA: Diagnosis not present

## 2020-12-25 DIAGNOSIS — E8779 Other fluid overload: Secondary | ICD-10-CM | POA: Diagnosis not present

## 2020-12-26 DIAGNOSIS — N2581 Secondary hyperparathyroidism of renal origin: Secondary | ICD-10-CM | POA: Diagnosis not present

## 2020-12-26 DIAGNOSIS — Z992 Dependence on renal dialysis: Secondary | ICD-10-CM | POA: Diagnosis not present

## 2020-12-26 DIAGNOSIS — D631 Anemia in chronic kidney disease: Secondary | ICD-10-CM | POA: Diagnosis not present

## 2020-12-26 DIAGNOSIS — D509 Iron deficiency anemia, unspecified: Secondary | ICD-10-CM | POA: Diagnosis not present

## 2020-12-26 DIAGNOSIS — E8779 Other fluid overload: Secondary | ICD-10-CM | POA: Diagnosis not present

## 2020-12-26 DIAGNOSIS — N186 End stage renal disease: Secondary | ICD-10-CM | POA: Diagnosis not present

## 2020-12-29 DIAGNOSIS — Z992 Dependence on renal dialysis: Secondary | ICD-10-CM | POA: Diagnosis not present

## 2020-12-29 DIAGNOSIS — N2581 Secondary hyperparathyroidism of renal origin: Secondary | ICD-10-CM | POA: Diagnosis not present

## 2020-12-29 DIAGNOSIS — N186 End stage renal disease: Secondary | ICD-10-CM | POA: Diagnosis not present

## 2020-12-29 DIAGNOSIS — D631 Anemia in chronic kidney disease: Secondary | ICD-10-CM | POA: Diagnosis not present

## 2020-12-29 DIAGNOSIS — E8779 Other fluid overload: Secondary | ICD-10-CM | POA: Diagnosis not present

## 2020-12-29 DIAGNOSIS — D509 Iron deficiency anemia, unspecified: Secondary | ICD-10-CM | POA: Diagnosis not present

## 2020-12-30 DIAGNOSIS — D631 Anemia in chronic kidney disease: Secondary | ICD-10-CM | POA: Diagnosis not present

## 2020-12-30 DIAGNOSIS — Z992 Dependence on renal dialysis: Secondary | ICD-10-CM | POA: Diagnosis not present

## 2020-12-30 DIAGNOSIS — N2581 Secondary hyperparathyroidism of renal origin: Secondary | ICD-10-CM | POA: Diagnosis not present

## 2020-12-30 DIAGNOSIS — E8779 Other fluid overload: Secondary | ICD-10-CM | POA: Diagnosis not present

## 2020-12-30 DIAGNOSIS — N186 End stage renal disease: Secondary | ICD-10-CM | POA: Diagnosis not present

## 2020-12-30 DIAGNOSIS — D509 Iron deficiency anemia, unspecified: Secondary | ICD-10-CM | POA: Diagnosis not present

## 2021-01-01 DIAGNOSIS — D631 Anemia in chronic kidney disease: Secondary | ICD-10-CM | POA: Diagnosis not present

## 2021-01-01 DIAGNOSIS — Z992 Dependence on renal dialysis: Secondary | ICD-10-CM | POA: Diagnosis not present

## 2021-01-01 DIAGNOSIS — N186 End stage renal disease: Secondary | ICD-10-CM | POA: Diagnosis not present

## 2021-01-01 DIAGNOSIS — D509 Iron deficiency anemia, unspecified: Secondary | ICD-10-CM | POA: Diagnosis not present

## 2021-01-01 DIAGNOSIS — N2581 Secondary hyperparathyroidism of renal origin: Secondary | ICD-10-CM | POA: Diagnosis not present

## 2021-01-01 DIAGNOSIS — E8779 Other fluid overload: Secondary | ICD-10-CM | POA: Diagnosis not present

## 2021-01-02 DIAGNOSIS — D509 Iron deficiency anemia, unspecified: Secondary | ICD-10-CM | POA: Diagnosis not present

## 2021-01-02 DIAGNOSIS — D631 Anemia in chronic kidney disease: Secondary | ICD-10-CM | POA: Diagnosis not present

## 2021-01-02 DIAGNOSIS — Z992 Dependence on renal dialysis: Secondary | ICD-10-CM | POA: Diagnosis not present

## 2021-01-02 DIAGNOSIS — N186 End stage renal disease: Secondary | ICD-10-CM | POA: Diagnosis not present

## 2021-01-02 DIAGNOSIS — E8779 Other fluid overload: Secondary | ICD-10-CM | POA: Diagnosis not present

## 2021-01-02 DIAGNOSIS — N2581 Secondary hyperparathyroidism of renal origin: Secondary | ICD-10-CM | POA: Diagnosis not present

## 2021-01-05 DIAGNOSIS — D631 Anemia in chronic kidney disease: Secondary | ICD-10-CM | POA: Diagnosis not present

## 2021-01-05 DIAGNOSIS — E8779 Other fluid overload: Secondary | ICD-10-CM | POA: Diagnosis not present

## 2021-01-05 DIAGNOSIS — N2581 Secondary hyperparathyroidism of renal origin: Secondary | ICD-10-CM | POA: Diagnosis not present

## 2021-01-05 DIAGNOSIS — E875 Hyperkalemia: Secondary | ICD-10-CM | POA: Diagnosis not present

## 2021-01-05 DIAGNOSIS — D509 Iron deficiency anemia, unspecified: Secondary | ICD-10-CM | POA: Diagnosis not present

## 2021-01-05 DIAGNOSIS — Z992 Dependence on renal dialysis: Secondary | ICD-10-CM | POA: Diagnosis not present

## 2021-01-05 DIAGNOSIS — N186 End stage renal disease: Secondary | ICD-10-CM | POA: Diagnosis not present

## 2021-01-06 DIAGNOSIS — N2581 Secondary hyperparathyroidism of renal origin: Secondary | ICD-10-CM | POA: Diagnosis not present

## 2021-01-06 DIAGNOSIS — D509 Iron deficiency anemia, unspecified: Secondary | ICD-10-CM | POA: Diagnosis not present

## 2021-01-06 DIAGNOSIS — D631 Anemia in chronic kidney disease: Secondary | ICD-10-CM | POA: Diagnosis not present

## 2021-01-06 DIAGNOSIS — E8779 Other fluid overload: Secondary | ICD-10-CM | POA: Diagnosis not present

## 2021-01-06 DIAGNOSIS — Z992 Dependence on renal dialysis: Secondary | ICD-10-CM | POA: Diagnosis not present

## 2021-01-06 DIAGNOSIS — N186 End stage renal disease: Secondary | ICD-10-CM | POA: Diagnosis not present

## 2021-01-08 DIAGNOSIS — N186 End stage renal disease: Secondary | ICD-10-CM | POA: Diagnosis not present

## 2021-01-08 DIAGNOSIS — D509 Iron deficiency anemia, unspecified: Secondary | ICD-10-CM | POA: Diagnosis not present

## 2021-01-08 DIAGNOSIS — Z992 Dependence on renal dialysis: Secondary | ICD-10-CM | POA: Diagnosis not present

## 2021-01-08 DIAGNOSIS — E8779 Other fluid overload: Secondary | ICD-10-CM | POA: Diagnosis not present

## 2021-01-08 DIAGNOSIS — N2581 Secondary hyperparathyroidism of renal origin: Secondary | ICD-10-CM | POA: Diagnosis not present

## 2021-01-08 DIAGNOSIS — D631 Anemia in chronic kidney disease: Secondary | ICD-10-CM | POA: Diagnosis not present

## 2021-01-09 DIAGNOSIS — D631 Anemia in chronic kidney disease: Secondary | ICD-10-CM | POA: Diagnosis not present

## 2021-01-09 DIAGNOSIS — D509 Iron deficiency anemia, unspecified: Secondary | ICD-10-CM | POA: Diagnosis not present

## 2021-01-09 DIAGNOSIS — N2581 Secondary hyperparathyroidism of renal origin: Secondary | ICD-10-CM | POA: Diagnosis not present

## 2021-01-09 DIAGNOSIS — N186 End stage renal disease: Secondary | ICD-10-CM | POA: Diagnosis not present

## 2021-01-09 DIAGNOSIS — Z992 Dependence on renal dialysis: Secondary | ICD-10-CM | POA: Diagnosis not present

## 2021-01-09 DIAGNOSIS — E8779 Other fluid overload: Secondary | ICD-10-CM | POA: Diagnosis not present

## 2021-01-13 DIAGNOSIS — Z992 Dependence on renal dialysis: Secondary | ICD-10-CM | POA: Diagnosis not present

## 2021-01-13 DIAGNOSIS — D631 Anemia in chronic kidney disease: Secondary | ICD-10-CM | POA: Diagnosis not present

## 2021-01-13 DIAGNOSIS — E8779 Other fluid overload: Secondary | ICD-10-CM | POA: Diagnosis not present

## 2021-01-13 DIAGNOSIS — D509 Iron deficiency anemia, unspecified: Secondary | ICD-10-CM | POA: Diagnosis not present

## 2021-01-13 DIAGNOSIS — N186 End stage renal disease: Secondary | ICD-10-CM | POA: Diagnosis not present

## 2021-01-13 DIAGNOSIS — N2581 Secondary hyperparathyroidism of renal origin: Secondary | ICD-10-CM | POA: Diagnosis not present

## 2021-01-14 ENCOUNTER — Encounter (HOSPITAL_COMMUNITY): Payer: Medicare Other

## 2021-01-14 DIAGNOSIS — Z905 Acquired absence of kidney: Secondary | ICD-10-CM | POA: Diagnosis not present

## 2021-01-14 DIAGNOSIS — Z79899 Other long term (current) drug therapy: Secondary | ICD-10-CM | POA: Diagnosis not present

## 2021-01-14 DIAGNOSIS — E119 Type 2 diabetes mellitus without complications: Secondary | ICD-10-CM | POA: Diagnosis not present

## 2021-01-14 DIAGNOSIS — C641 Malignant neoplasm of right kidney, except renal pelvis: Secondary | ICD-10-CM | POA: Diagnosis not present

## 2021-01-14 DIAGNOSIS — Z5112 Encounter for antineoplastic immunotherapy: Secondary | ICD-10-CM | POA: Diagnosis not present

## 2021-01-14 DIAGNOSIS — Z8673 Personal history of transient ischemic attack (TIA), and cerebral infarction without residual deficits: Secondary | ICD-10-CM | POA: Diagnosis not present

## 2021-01-14 DIAGNOSIS — R5383 Other fatigue: Secondary | ICD-10-CM | POA: Diagnosis not present

## 2021-01-14 DIAGNOSIS — I10 Essential (primary) hypertension: Secondary | ICD-10-CM | POA: Diagnosis not present

## 2021-01-15 DIAGNOSIS — E8779 Other fluid overload: Secondary | ICD-10-CM | POA: Diagnosis not present

## 2021-01-15 DIAGNOSIS — Z992 Dependence on renal dialysis: Secondary | ICD-10-CM | POA: Diagnosis not present

## 2021-01-15 DIAGNOSIS — D631 Anemia in chronic kidney disease: Secondary | ICD-10-CM | POA: Diagnosis not present

## 2021-01-15 DIAGNOSIS — N186 End stage renal disease: Secondary | ICD-10-CM | POA: Diagnosis not present

## 2021-01-15 DIAGNOSIS — N2581 Secondary hyperparathyroidism of renal origin: Secondary | ICD-10-CM | POA: Diagnosis not present

## 2021-01-15 DIAGNOSIS — D509 Iron deficiency anemia, unspecified: Secondary | ICD-10-CM | POA: Diagnosis not present

## 2021-01-17 DIAGNOSIS — Z992 Dependence on renal dialysis: Secondary | ICD-10-CM | POA: Diagnosis not present

## 2021-01-17 DIAGNOSIS — N2581 Secondary hyperparathyroidism of renal origin: Secondary | ICD-10-CM | POA: Diagnosis not present

## 2021-01-17 DIAGNOSIS — E8779 Other fluid overload: Secondary | ICD-10-CM | POA: Diagnosis not present

## 2021-01-17 DIAGNOSIS — D509 Iron deficiency anemia, unspecified: Secondary | ICD-10-CM | POA: Diagnosis not present

## 2021-01-17 DIAGNOSIS — D631 Anemia in chronic kidney disease: Secondary | ICD-10-CM | POA: Diagnosis not present

## 2021-01-17 DIAGNOSIS — N186 End stage renal disease: Secondary | ICD-10-CM | POA: Diagnosis not present

## 2021-01-20 DIAGNOSIS — E8779 Other fluid overload: Secondary | ICD-10-CM | POA: Diagnosis not present

## 2021-01-20 DIAGNOSIS — D631 Anemia in chronic kidney disease: Secondary | ICD-10-CM | POA: Diagnosis not present

## 2021-01-20 DIAGNOSIS — Z992 Dependence on renal dialysis: Secondary | ICD-10-CM | POA: Diagnosis not present

## 2021-01-20 DIAGNOSIS — D509 Iron deficiency anemia, unspecified: Secondary | ICD-10-CM | POA: Diagnosis not present

## 2021-01-20 DIAGNOSIS — N186 End stage renal disease: Secondary | ICD-10-CM | POA: Diagnosis not present

## 2021-01-20 DIAGNOSIS — N2581 Secondary hyperparathyroidism of renal origin: Secondary | ICD-10-CM | POA: Diagnosis not present

## 2021-01-22 DIAGNOSIS — Z992 Dependence on renal dialysis: Secondary | ICD-10-CM | POA: Diagnosis not present

## 2021-01-22 DIAGNOSIS — N186 End stage renal disease: Secondary | ICD-10-CM | POA: Diagnosis not present

## 2021-01-22 DIAGNOSIS — D631 Anemia in chronic kidney disease: Secondary | ICD-10-CM | POA: Diagnosis not present

## 2021-01-22 DIAGNOSIS — N2581 Secondary hyperparathyroidism of renal origin: Secondary | ICD-10-CM | POA: Diagnosis not present

## 2021-01-22 DIAGNOSIS — E8779 Other fluid overload: Secondary | ICD-10-CM | POA: Diagnosis not present

## 2021-01-22 DIAGNOSIS — D509 Iron deficiency anemia, unspecified: Secondary | ICD-10-CM | POA: Diagnosis not present

## 2021-01-24 DIAGNOSIS — E8779 Other fluid overload: Secondary | ICD-10-CM | POA: Diagnosis not present

## 2021-01-24 DIAGNOSIS — Z992 Dependence on renal dialysis: Secondary | ICD-10-CM | POA: Diagnosis not present

## 2021-01-24 DIAGNOSIS — D509 Iron deficiency anemia, unspecified: Secondary | ICD-10-CM | POA: Diagnosis not present

## 2021-01-24 DIAGNOSIS — N186 End stage renal disease: Secondary | ICD-10-CM | POA: Diagnosis not present

## 2021-01-24 DIAGNOSIS — N2581 Secondary hyperparathyroidism of renal origin: Secondary | ICD-10-CM | POA: Diagnosis not present

## 2021-01-24 DIAGNOSIS — D631 Anemia in chronic kidney disease: Secondary | ICD-10-CM | POA: Diagnosis not present

## 2021-01-27 DIAGNOSIS — D509 Iron deficiency anemia, unspecified: Secondary | ICD-10-CM | POA: Diagnosis not present

## 2021-01-27 DIAGNOSIS — N186 End stage renal disease: Secondary | ICD-10-CM | POA: Diagnosis not present

## 2021-01-27 DIAGNOSIS — D631 Anemia in chronic kidney disease: Secondary | ICD-10-CM | POA: Diagnosis not present

## 2021-01-27 DIAGNOSIS — E8779 Other fluid overload: Secondary | ICD-10-CM | POA: Diagnosis not present

## 2021-01-27 DIAGNOSIS — N2581 Secondary hyperparathyroidism of renal origin: Secondary | ICD-10-CM | POA: Diagnosis not present

## 2021-01-27 DIAGNOSIS — Z992 Dependence on renal dialysis: Secondary | ICD-10-CM | POA: Diagnosis not present

## 2021-01-29 DIAGNOSIS — Z992 Dependence on renal dialysis: Secondary | ICD-10-CM | POA: Diagnosis not present

## 2021-01-29 DIAGNOSIS — E8779 Other fluid overload: Secondary | ICD-10-CM | POA: Diagnosis not present

## 2021-01-29 DIAGNOSIS — N186 End stage renal disease: Secondary | ICD-10-CM | POA: Diagnosis not present

## 2021-01-29 DIAGNOSIS — N2581 Secondary hyperparathyroidism of renal origin: Secondary | ICD-10-CM | POA: Diagnosis not present

## 2021-01-29 DIAGNOSIS — D631 Anemia in chronic kidney disease: Secondary | ICD-10-CM | POA: Diagnosis not present

## 2021-01-29 DIAGNOSIS — D509 Iron deficiency anemia, unspecified: Secondary | ICD-10-CM | POA: Diagnosis not present

## 2021-01-31 DIAGNOSIS — N2581 Secondary hyperparathyroidism of renal origin: Secondary | ICD-10-CM | POA: Diagnosis not present

## 2021-01-31 DIAGNOSIS — N186 End stage renal disease: Secondary | ICD-10-CM | POA: Diagnosis not present

## 2021-01-31 DIAGNOSIS — D509 Iron deficiency anemia, unspecified: Secondary | ICD-10-CM | POA: Diagnosis not present

## 2021-01-31 DIAGNOSIS — E8779 Other fluid overload: Secondary | ICD-10-CM | POA: Diagnosis not present

## 2021-01-31 DIAGNOSIS — D631 Anemia in chronic kidney disease: Secondary | ICD-10-CM | POA: Diagnosis not present

## 2021-01-31 DIAGNOSIS — Z992 Dependence on renal dialysis: Secondary | ICD-10-CM | POA: Diagnosis not present

## 2021-02-03 DIAGNOSIS — N186 End stage renal disease: Secondary | ICD-10-CM | POA: Diagnosis not present

## 2021-02-03 DIAGNOSIS — D509 Iron deficiency anemia, unspecified: Secondary | ICD-10-CM | POA: Diagnosis not present

## 2021-02-03 DIAGNOSIS — Z992 Dependence on renal dialysis: Secondary | ICD-10-CM | POA: Diagnosis not present

## 2021-02-03 DIAGNOSIS — D631 Anemia in chronic kidney disease: Secondary | ICD-10-CM | POA: Diagnosis not present

## 2021-02-03 DIAGNOSIS — N2581 Secondary hyperparathyroidism of renal origin: Secondary | ICD-10-CM | POA: Diagnosis not present

## 2021-02-03 DIAGNOSIS — C649 Malignant neoplasm of unspecified kidney, except renal pelvis: Secondary | ICD-10-CM | POA: Diagnosis not present

## 2021-02-03 DIAGNOSIS — E8779 Other fluid overload: Secondary | ICD-10-CM | POA: Diagnosis not present

## 2021-02-04 DIAGNOSIS — I82220 Acute embolism and thrombosis of inferior vena cava: Secondary | ICD-10-CM | POA: Diagnosis not present

## 2021-02-04 DIAGNOSIS — Z5112 Encounter for antineoplastic immunotherapy: Secondary | ICD-10-CM | POA: Diagnosis not present

## 2021-02-04 DIAGNOSIS — C641 Malignant neoplasm of right kidney, except renal pelvis: Secondary | ICD-10-CM | POA: Diagnosis not present

## 2021-02-04 DIAGNOSIS — Z923 Personal history of irradiation: Secondary | ICD-10-CM | POA: Diagnosis not present

## 2021-02-04 DIAGNOSIS — E1122 Type 2 diabetes mellitus with diabetic chronic kidney disease: Secondary | ICD-10-CM | POA: Diagnosis not present

## 2021-02-04 DIAGNOSIS — N2581 Secondary hyperparathyroidism of renal origin: Secondary | ICD-10-CM | POA: Diagnosis not present

## 2021-02-04 DIAGNOSIS — N186 End stage renal disease: Secondary | ICD-10-CM | POA: Diagnosis not present

## 2021-02-04 DIAGNOSIS — Z8546 Personal history of malignant neoplasm of prostate: Secondary | ICD-10-CM | POA: Diagnosis not present

## 2021-02-04 DIAGNOSIS — R600 Localized edema: Secondary | ICD-10-CM | POA: Diagnosis not present

## 2021-02-04 DIAGNOSIS — Z905 Acquired absence of kidney: Secondary | ICD-10-CM | POA: Diagnosis not present

## 2021-02-04 DIAGNOSIS — D509 Iron deficiency anemia, unspecified: Secondary | ICD-10-CM | POA: Diagnosis not present

## 2021-02-04 DIAGNOSIS — R5383 Other fatigue: Secondary | ICD-10-CM | POA: Diagnosis not present

## 2021-02-04 DIAGNOSIS — C649 Malignant neoplasm of unspecified kidney, except renal pelvis: Secondary | ICD-10-CM | POA: Diagnosis not present

## 2021-02-04 DIAGNOSIS — D649 Anemia, unspecified: Secondary | ICD-10-CM | POA: Diagnosis not present

## 2021-02-04 DIAGNOSIS — Z79899 Other long term (current) drug therapy: Secondary | ICD-10-CM | POA: Diagnosis not present

## 2021-02-04 DIAGNOSIS — I120 Hypertensive chronic kidney disease with stage 5 chronic kidney disease or end stage renal disease: Secondary | ICD-10-CM | POA: Diagnosis not present

## 2021-02-04 DIAGNOSIS — I10 Essential (primary) hypertension: Secondary | ICD-10-CM | POA: Diagnosis not present

## 2021-02-04 DIAGNOSIS — D631 Anemia in chronic kidney disease: Secondary | ICD-10-CM | POA: Diagnosis not present

## 2021-02-04 DIAGNOSIS — Z992 Dependence on renal dialysis: Secondary | ICD-10-CM | POA: Diagnosis not present

## 2021-02-05 DIAGNOSIS — D509 Iron deficiency anemia, unspecified: Secondary | ICD-10-CM | POA: Diagnosis not present

## 2021-02-05 DIAGNOSIS — D631 Anemia in chronic kidney disease: Secondary | ICD-10-CM | POA: Diagnosis not present

## 2021-02-05 DIAGNOSIS — Z992 Dependence on renal dialysis: Secondary | ICD-10-CM | POA: Diagnosis not present

## 2021-02-05 DIAGNOSIS — N186 End stage renal disease: Secondary | ICD-10-CM | POA: Diagnosis not present

## 2021-02-05 DIAGNOSIS — N2581 Secondary hyperparathyroidism of renal origin: Secondary | ICD-10-CM | POA: Diagnosis not present

## 2021-02-07 DIAGNOSIS — N2581 Secondary hyperparathyroidism of renal origin: Secondary | ICD-10-CM | POA: Diagnosis not present

## 2021-02-07 DIAGNOSIS — N186 End stage renal disease: Secondary | ICD-10-CM | POA: Diagnosis not present

## 2021-02-07 DIAGNOSIS — Z992 Dependence on renal dialysis: Secondary | ICD-10-CM | POA: Diagnosis not present

## 2021-02-07 DIAGNOSIS — D631 Anemia in chronic kidney disease: Secondary | ICD-10-CM | POA: Diagnosis not present

## 2021-02-07 DIAGNOSIS — D509 Iron deficiency anemia, unspecified: Secondary | ICD-10-CM | POA: Diagnosis not present

## 2021-02-10 DIAGNOSIS — N2581 Secondary hyperparathyroidism of renal origin: Secondary | ICD-10-CM | POA: Diagnosis not present

## 2021-02-10 DIAGNOSIS — D509 Iron deficiency anemia, unspecified: Secondary | ICD-10-CM | POA: Diagnosis not present

## 2021-02-10 DIAGNOSIS — D631 Anemia in chronic kidney disease: Secondary | ICD-10-CM | POA: Diagnosis not present

## 2021-02-10 DIAGNOSIS — N186 End stage renal disease: Secondary | ICD-10-CM | POA: Diagnosis not present

## 2021-02-10 DIAGNOSIS — Z992 Dependence on renal dialysis: Secondary | ICD-10-CM | POA: Diagnosis not present

## 2021-02-12 DIAGNOSIS — N186 End stage renal disease: Secondary | ICD-10-CM | POA: Diagnosis not present

## 2021-02-12 DIAGNOSIS — D631 Anemia in chronic kidney disease: Secondary | ICD-10-CM | POA: Diagnosis not present

## 2021-02-12 DIAGNOSIS — N2581 Secondary hyperparathyroidism of renal origin: Secondary | ICD-10-CM | POA: Diagnosis not present

## 2021-02-12 DIAGNOSIS — D509 Iron deficiency anemia, unspecified: Secondary | ICD-10-CM | POA: Diagnosis not present

## 2021-02-12 DIAGNOSIS — Z992 Dependence on renal dialysis: Secondary | ICD-10-CM | POA: Diagnosis not present

## 2021-02-14 DIAGNOSIS — D509 Iron deficiency anemia, unspecified: Secondary | ICD-10-CM | POA: Diagnosis not present

## 2021-02-14 DIAGNOSIS — N2581 Secondary hyperparathyroidism of renal origin: Secondary | ICD-10-CM | POA: Diagnosis not present

## 2021-02-14 DIAGNOSIS — Z992 Dependence on renal dialysis: Secondary | ICD-10-CM | POA: Diagnosis not present

## 2021-02-14 DIAGNOSIS — N186 End stage renal disease: Secondary | ICD-10-CM | POA: Diagnosis not present

## 2021-02-14 DIAGNOSIS — D631 Anemia in chronic kidney disease: Secondary | ICD-10-CM | POA: Diagnosis not present

## 2021-02-17 DIAGNOSIS — Z992 Dependence on renal dialysis: Secondary | ICD-10-CM | POA: Diagnosis not present

## 2021-02-17 DIAGNOSIS — N186 End stage renal disease: Secondary | ICD-10-CM | POA: Diagnosis not present

## 2021-02-17 DIAGNOSIS — D631 Anemia in chronic kidney disease: Secondary | ICD-10-CM | POA: Diagnosis not present

## 2021-02-17 DIAGNOSIS — N2581 Secondary hyperparathyroidism of renal origin: Secondary | ICD-10-CM | POA: Diagnosis not present

## 2021-02-17 DIAGNOSIS — D509 Iron deficiency anemia, unspecified: Secondary | ICD-10-CM | POA: Diagnosis not present

## 2021-02-19 DIAGNOSIS — Z992 Dependence on renal dialysis: Secondary | ICD-10-CM | POA: Diagnosis not present

## 2021-02-19 DIAGNOSIS — D509 Iron deficiency anemia, unspecified: Secondary | ICD-10-CM | POA: Diagnosis not present

## 2021-02-19 DIAGNOSIS — D631 Anemia in chronic kidney disease: Secondary | ICD-10-CM | POA: Diagnosis not present

## 2021-02-19 DIAGNOSIS — N2581 Secondary hyperparathyroidism of renal origin: Secondary | ICD-10-CM | POA: Diagnosis not present

## 2021-02-19 DIAGNOSIS — N186 End stage renal disease: Secondary | ICD-10-CM | POA: Diagnosis not present

## 2021-02-21 DIAGNOSIS — D631 Anemia in chronic kidney disease: Secondary | ICD-10-CM | POA: Diagnosis not present

## 2021-02-21 DIAGNOSIS — N186 End stage renal disease: Secondary | ICD-10-CM | POA: Diagnosis not present

## 2021-02-21 DIAGNOSIS — Z992 Dependence on renal dialysis: Secondary | ICD-10-CM | POA: Diagnosis not present

## 2021-02-21 DIAGNOSIS — N2581 Secondary hyperparathyroidism of renal origin: Secondary | ICD-10-CM | POA: Diagnosis not present

## 2021-02-21 DIAGNOSIS — D509 Iron deficiency anemia, unspecified: Secondary | ICD-10-CM | POA: Diagnosis not present

## 2021-02-24 DIAGNOSIS — Z992 Dependence on renal dialysis: Secondary | ICD-10-CM | POA: Diagnosis not present

## 2021-02-24 DIAGNOSIS — N2581 Secondary hyperparathyroidism of renal origin: Secondary | ICD-10-CM | POA: Diagnosis not present

## 2021-02-24 DIAGNOSIS — N186 End stage renal disease: Secondary | ICD-10-CM | POA: Diagnosis not present

## 2021-02-24 DIAGNOSIS — D509 Iron deficiency anemia, unspecified: Secondary | ICD-10-CM | POA: Diagnosis not present

## 2021-02-24 DIAGNOSIS — D631 Anemia in chronic kidney disease: Secondary | ICD-10-CM | POA: Diagnosis not present

## 2021-02-26 DIAGNOSIS — Z992 Dependence on renal dialysis: Secondary | ICD-10-CM | POA: Diagnosis not present

## 2021-02-26 DIAGNOSIS — N186 End stage renal disease: Secondary | ICD-10-CM | POA: Diagnosis not present

## 2021-02-26 DIAGNOSIS — N2581 Secondary hyperparathyroidism of renal origin: Secondary | ICD-10-CM | POA: Diagnosis not present

## 2021-02-26 DIAGNOSIS — D631 Anemia in chronic kidney disease: Secondary | ICD-10-CM | POA: Diagnosis not present

## 2021-02-26 DIAGNOSIS — D509 Iron deficiency anemia, unspecified: Secondary | ICD-10-CM | POA: Diagnosis not present

## 2021-02-28 DIAGNOSIS — Z992 Dependence on renal dialysis: Secondary | ICD-10-CM | POA: Diagnosis not present

## 2021-02-28 DIAGNOSIS — N2581 Secondary hyperparathyroidism of renal origin: Secondary | ICD-10-CM | POA: Diagnosis not present

## 2021-02-28 DIAGNOSIS — D631 Anemia in chronic kidney disease: Secondary | ICD-10-CM | POA: Diagnosis not present

## 2021-02-28 DIAGNOSIS — N186 End stage renal disease: Secondary | ICD-10-CM | POA: Diagnosis not present

## 2021-02-28 DIAGNOSIS — D509 Iron deficiency anemia, unspecified: Secondary | ICD-10-CM | POA: Diagnosis not present

## 2021-03-02 DIAGNOSIS — I120 Hypertensive chronic kidney disease with stage 5 chronic kidney disease or end stage renal disease: Secondary | ICD-10-CM | POA: Diagnosis not present

## 2021-03-02 DIAGNOSIS — R600 Localized edema: Secondary | ICD-10-CM | POA: Diagnosis not present

## 2021-03-02 DIAGNOSIS — L309 Dermatitis, unspecified: Secondary | ICD-10-CM | POA: Diagnosis not present

## 2021-03-02 DIAGNOSIS — I82220 Acute embolism and thrombosis of inferior vena cava: Secondary | ICD-10-CM | POA: Diagnosis not present

## 2021-03-02 DIAGNOSIS — Z5112 Encounter for antineoplastic immunotherapy: Secondary | ICD-10-CM | POA: Diagnosis not present

## 2021-03-02 DIAGNOSIS — Z992 Dependence on renal dialysis: Secondary | ICD-10-CM | POA: Diagnosis not present

## 2021-03-02 DIAGNOSIS — R918 Other nonspecific abnormal finding of lung field: Secondary | ICD-10-CM | POA: Diagnosis not present

## 2021-03-02 DIAGNOSIS — Z87891 Personal history of nicotine dependence: Secondary | ICD-10-CM | POA: Diagnosis not present

## 2021-03-02 DIAGNOSIS — Z905 Acquired absence of kidney: Secondary | ICD-10-CM | POA: Diagnosis not present

## 2021-03-02 DIAGNOSIS — N186 End stage renal disease: Secondary | ICD-10-CM | POA: Diagnosis not present

## 2021-03-02 DIAGNOSIS — C641 Malignant neoplasm of right kidney, except renal pelvis: Secondary | ICD-10-CM | POA: Diagnosis not present

## 2021-03-03 DIAGNOSIS — D631 Anemia in chronic kidney disease: Secondary | ICD-10-CM | POA: Diagnosis not present

## 2021-03-03 DIAGNOSIS — N2581 Secondary hyperparathyroidism of renal origin: Secondary | ICD-10-CM | POA: Diagnosis not present

## 2021-03-03 DIAGNOSIS — N186 End stage renal disease: Secondary | ICD-10-CM | POA: Diagnosis not present

## 2021-03-03 DIAGNOSIS — Z992 Dependence on renal dialysis: Secondary | ICD-10-CM | POA: Diagnosis not present

## 2021-03-03 DIAGNOSIS — D509 Iron deficiency anemia, unspecified: Secondary | ICD-10-CM | POA: Diagnosis not present

## 2021-03-04 DIAGNOSIS — C649 Malignant neoplasm of unspecified kidney, except renal pelvis: Secondary | ICD-10-CM | POA: Diagnosis not present

## 2021-03-04 DIAGNOSIS — T82858A Stenosis of vascular prosthetic devices, implants and grafts, initial encounter: Secondary | ICD-10-CM | POA: Diagnosis not present

## 2021-03-04 DIAGNOSIS — Z992 Dependence on renal dialysis: Secondary | ICD-10-CM | POA: Diagnosis not present

## 2021-03-04 DIAGNOSIS — N186 End stage renal disease: Secondary | ICD-10-CM | POA: Diagnosis not present

## 2021-03-04 DIAGNOSIS — I871 Compression of vein: Secondary | ICD-10-CM | POA: Diagnosis not present

## 2021-03-05 DIAGNOSIS — D631 Anemia in chronic kidney disease: Secondary | ICD-10-CM | POA: Diagnosis not present

## 2021-03-05 DIAGNOSIS — N186 End stage renal disease: Secondary | ICD-10-CM | POA: Diagnosis not present

## 2021-03-05 DIAGNOSIS — Z992 Dependence on renal dialysis: Secondary | ICD-10-CM | POA: Diagnosis not present

## 2021-03-05 DIAGNOSIS — D509 Iron deficiency anemia, unspecified: Secondary | ICD-10-CM | POA: Diagnosis not present

## 2021-03-05 DIAGNOSIS — N2581 Secondary hyperparathyroidism of renal origin: Secondary | ICD-10-CM | POA: Diagnosis not present

## 2021-03-06 DIAGNOSIS — E1121 Type 2 diabetes mellitus with diabetic nephropathy: Secondary | ICD-10-CM | POA: Diagnosis not present

## 2021-03-07 DIAGNOSIS — N186 End stage renal disease: Secondary | ICD-10-CM | POA: Diagnosis not present

## 2021-03-07 DIAGNOSIS — D509 Iron deficiency anemia, unspecified: Secondary | ICD-10-CM | POA: Diagnosis not present

## 2021-03-07 DIAGNOSIS — D631 Anemia in chronic kidney disease: Secondary | ICD-10-CM | POA: Diagnosis not present

## 2021-03-07 DIAGNOSIS — N2581 Secondary hyperparathyroidism of renal origin: Secondary | ICD-10-CM | POA: Diagnosis not present

## 2021-03-07 DIAGNOSIS — Z992 Dependence on renal dialysis: Secondary | ICD-10-CM | POA: Diagnosis not present

## 2021-03-10 DIAGNOSIS — Z992 Dependence on renal dialysis: Secondary | ICD-10-CM | POA: Diagnosis not present

## 2021-03-10 DIAGNOSIS — N2581 Secondary hyperparathyroidism of renal origin: Secondary | ICD-10-CM | POA: Diagnosis not present

## 2021-03-10 DIAGNOSIS — D509 Iron deficiency anemia, unspecified: Secondary | ICD-10-CM | POA: Diagnosis not present

## 2021-03-10 DIAGNOSIS — D631 Anemia in chronic kidney disease: Secondary | ICD-10-CM | POA: Diagnosis not present

## 2021-03-10 DIAGNOSIS — N186 End stage renal disease: Secondary | ICD-10-CM | POA: Diagnosis not present

## 2021-03-12 DIAGNOSIS — N186 End stage renal disease: Secondary | ICD-10-CM | POA: Diagnosis not present

## 2021-03-12 DIAGNOSIS — N2581 Secondary hyperparathyroidism of renal origin: Secondary | ICD-10-CM | POA: Diagnosis not present

## 2021-03-12 DIAGNOSIS — D631 Anemia in chronic kidney disease: Secondary | ICD-10-CM | POA: Diagnosis not present

## 2021-03-12 DIAGNOSIS — D509 Iron deficiency anemia, unspecified: Secondary | ICD-10-CM | POA: Diagnosis not present

## 2021-03-12 DIAGNOSIS — Z992 Dependence on renal dialysis: Secondary | ICD-10-CM | POA: Diagnosis not present

## 2021-03-14 DIAGNOSIS — N186 End stage renal disease: Secondary | ICD-10-CM | POA: Diagnosis not present

## 2021-03-14 DIAGNOSIS — D631 Anemia in chronic kidney disease: Secondary | ICD-10-CM | POA: Diagnosis not present

## 2021-03-14 DIAGNOSIS — Z992 Dependence on renal dialysis: Secondary | ICD-10-CM | POA: Diagnosis not present

## 2021-03-14 DIAGNOSIS — N2581 Secondary hyperparathyroidism of renal origin: Secondary | ICD-10-CM | POA: Diagnosis not present

## 2021-03-14 DIAGNOSIS — D509 Iron deficiency anemia, unspecified: Secondary | ICD-10-CM | POA: Diagnosis not present

## 2021-03-17 DIAGNOSIS — N2581 Secondary hyperparathyroidism of renal origin: Secondary | ICD-10-CM | POA: Diagnosis not present

## 2021-03-17 DIAGNOSIS — N186 End stage renal disease: Secondary | ICD-10-CM | POA: Diagnosis not present

## 2021-03-17 DIAGNOSIS — D631 Anemia in chronic kidney disease: Secondary | ICD-10-CM | POA: Diagnosis not present

## 2021-03-17 DIAGNOSIS — Z992 Dependence on renal dialysis: Secondary | ICD-10-CM | POA: Diagnosis not present

## 2021-03-17 DIAGNOSIS — D509 Iron deficiency anemia, unspecified: Secondary | ICD-10-CM | POA: Diagnosis not present

## 2021-03-18 DIAGNOSIS — N186 End stage renal disease: Secondary | ICD-10-CM | POA: Diagnosis not present

## 2021-03-18 DIAGNOSIS — Z992 Dependence on renal dialysis: Secondary | ICD-10-CM | POA: Diagnosis not present

## 2021-03-18 DIAGNOSIS — Z452 Encounter for adjustment and management of vascular access device: Secondary | ICD-10-CM | POA: Diagnosis not present

## 2021-03-19 DIAGNOSIS — Z992 Dependence on renal dialysis: Secondary | ICD-10-CM | POA: Diagnosis not present

## 2021-03-19 DIAGNOSIS — N2581 Secondary hyperparathyroidism of renal origin: Secondary | ICD-10-CM | POA: Diagnosis not present

## 2021-03-19 DIAGNOSIS — N186 End stage renal disease: Secondary | ICD-10-CM | POA: Diagnosis not present

## 2021-03-19 DIAGNOSIS — D631 Anemia in chronic kidney disease: Secondary | ICD-10-CM | POA: Diagnosis not present

## 2021-03-19 DIAGNOSIS — D509 Iron deficiency anemia, unspecified: Secondary | ICD-10-CM | POA: Diagnosis not present

## 2021-03-21 DIAGNOSIS — Z992 Dependence on renal dialysis: Secondary | ICD-10-CM | POA: Diagnosis not present

## 2021-03-21 DIAGNOSIS — N2581 Secondary hyperparathyroidism of renal origin: Secondary | ICD-10-CM | POA: Diagnosis not present

## 2021-03-21 DIAGNOSIS — D631 Anemia in chronic kidney disease: Secondary | ICD-10-CM | POA: Diagnosis not present

## 2021-03-21 DIAGNOSIS — N186 End stage renal disease: Secondary | ICD-10-CM | POA: Diagnosis not present

## 2021-03-21 DIAGNOSIS — D509 Iron deficiency anemia, unspecified: Secondary | ICD-10-CM | POA: Diagnosis not present

## 2021-03-22 ENCOUNTER — Other Ambulatory Visit: Payer: Self-pay | Admitting: Vascular Surgery

## 2021-03-23 DIAGNOSIS — H40033 Anatomical narrow angle, bilateral: Secondary | ICD-10-CM | POA: Diagnosis not present

## 2021-03-23 DIAGNOSIS — H25813 Combined forms of age-related cataract, bilateral: Secondary | ICD-10-CM | POA: Diagnosis not present

## 2021-03-23 DIAGNOSIS — H40013 Open angle with borderline findings, low risk, bilateral: Secondary | ICD-10-CM | POA: Diagnosis not present

## 2021-03-23 DIAGNOSIS — E119 Type 2 diabetes mellitus without complications: Secondary | ICD-10-CM | POA: Diagnosis not present

## 2021-03-24 DIAGNOSIS — D631 Anemia in chronic kidney disease: Secondary | ICD-10-CM | POA: Diagnosis not present

## 2021-03-24 DIAGNOSIS — N186 End stage renal disease: Secondary | ICD-10-CM | POA: Diagnosis not present

## 2021-03-24 DIAGNOSIS — D509 Iron deficiency anemia, unspecified: Secondary | ICD-10-CM | POA: Diagnosis not present

## 2021-03-24 DIAGNOSIS — Z992 Dependence on renal dialysis: Secondary | ICD-10-CM | POA: Diagnosis not present

## 2021-03-24 DIAGNOSIS — N2581 Secondary hyperparathyroidism of renal origin: Secondary | ICD-10-CM | POA: Diagnosis not present

## 2021-03-26 DIAGNOSIS — Z992 Dependence on renal dialysis: Secondary | ICD-10-CM | POA: Diagnosis not present

## 2021-03-26 DIAGNOSIS — D631 Anemia in chronic kidney disease: Secondary | ICD-10-CM | POA: Diagnosis not present

## 2021-03-26 DIAGNOSIS — N2581 Secondary hyperparathyroidism of renal origin: Secondary | ICD-10-CM | POA: Diagnosis not present

## 2021-03-26 DIAGNOSIS — D509 Iron deficiency anemia, unspecified: Secondary | ICD-10-CM | POA: Diagnosis not present

## 2021-03-26 DIAGNOSIS — N186 End stage renal disease: Secondary | ICD-10-CM | POA: Diagnosis not present

## 2021-03-28 DIAGNOSIS — N2581 Secondary hyperparathyroidism of renal origin: Secondary | ICD-10-CM | POA: Diagnosis not present

## 2021-03-28 DIAGNOSIS — D631 Anemia in chronic kidney disease: Secondary | ICD-10-CM | POA: Diagnosis not present

## 2021-03-28 DIAGNOSIS — N186 End stage renal disease: Secondary | ICD-10-CM | POA: Diagnosis not present

## 2021-03-28 DIAGNOSIS — Z992 Dependence on renal dialysis: Secondary | ICD-10-CM | POA: Diagnosis not present

## 2021-03-28 DIAGNOSIS — D509 Iron deficiency anemia, unspecified: Secondary | ICD-10-CM | POA: Diagnosis not present

## 2021-03-31 DIAGNOSIS — N186 End stage renal disease: Secondary | ICD-10-CM | POA: Diagnosis not present

## 2021-03-31 DIAGNOSIS — D631 Anemia in chronic kidney disease: Secondary | ICD-10-CM | POA: Diagnosis not present

## 2021-03-31 DIAGNOSIS — Z992 Dependence on renal dialysis: Secondary | ICD-10-CM | POA: Diagnosis not present

## 2021-03-31 DIAGNOSIS — D509 Iron deficiency anemia, unspecified: Secondary | ICD-10-CM | POA: Diagnosis not present

## 2021-03-31 DIAGNOSIS — N2581 Secondary hyperparathyroidism of renal origin: Secondary | ICD-10-CM | POA: Diagnosis not present

## 2021-04-01 DIAGNOSIS — Z5112 Encounter for antineoplastic immunotherapy: Secondary | ICD-10-CM | POA: Diagnosis not present

## 2021-04-01 DIAGNOSIS — C641 Malignant neoplasm of right kidney, except renal pelvis: Secondary | ICD-10-CM | POA: Diagnosis not present

## 2021-04-02 DIAGNOSIS — D631 Anemia in chronic kidney disease: Secondary | ICD-10-CM | POA: Diagnosis not present

## 2021-04-02 DIAGNOSIS — Z992 Dependence on renal dialysis: Secondary | ICD-10-CM | POA: Diagnosis not present

## 2021-04-02 DIAGNOSIS — N186 End stage renal disease: Secondary | ICD-10-CM | POA: Diagnosis not present

## 2021-04-02 DIAGNOSIS — N2581 Secondary hyperparathyroidism of renal origin: Secondary | ICD-10-CM | POA: Diagnosis not present

## 2021-04-02 DIAGNOSIS — D509 Iron deficiency anemia, unspecified: Secondary | ICD-10-CM | POA: Diagnosis not present

## 2021-04-04 DIAGNOSIS — D631 Anemia in chronic kidney disease: Secondary | ICD-10-CM | POA: Diagnosis not present

## 2021-04-04 DIAGNOSIS — N186 End stage renal disease: Secondary | ICD-10-CM | POA: Diagnosis not present

## 2021-04-04 DIAGNOSIS — D509 Iron deficiency anemia, unspecified: Secondary | ICD-10-CM | POA: Diagnosis not present

## 2021-04-04 DIAGNOSIS — C649 Malignant neoplasm of unspecified kidney, except renal pelvis: Secondary | ICD-10-CM | POA: Diagnosis not present

## 2021-04-04 DIAGNOSIS — N2581 Secondary hyperparathyroidism of renal origin: Secondary | ICD-10-CM | POA: Diagnosis not present

## 2021-04-04 DIAGNOSIS — Z992 Dependence on renal dialysis: Secondary | ICD-10-CM | POA: Diagnosis not present

## 2021-04-05 DIAGNOSIS — E1121 Type 2 diabetes mellitus with diabetic nephropathy: Secondary | ICD-10-CM | POA: Diagnosis not present

## 2021-04-07 DIAGNOSIS — D509 Iron deficiency anemia, unspecified: Secondary | ICD-10-CM | POA: Diagnosis not present

## 2021-04-07 DIAGNOSIS — Z992 Dependence on renal dialysis: Secondary | ICD-10-CM | POA: Diagnosis not present

## 2021-04-07 DIAGNOSIS — N186 End stage renal disease: Secondary | ICD-10-CM | POA: Diagnosis not present

## 2021-04-07 DIAGNOSIS — N2581 Secondary hyperparathyroidism of renal origin: Secondary | ICD-10-CM | POA: Diagnosis not present

## 2021-04-07 DIAGNOSIS — D631 Anemia in chronic kidney disease: Secondary | ICD-10-CM | POA: Diagnosis not present

## 2021-04-09 DIAGNOSIS — N2581 Secondary hyperparathyroidism of renal origin: Secondary | ICD-10-CM | POA: Diagnosis not present

## 2021-04-09 DIAGNOSIS — Z992 Dependence on renal dialysis: Secondary | ICD-10-CM | POA: Diagnosis not present

## 2021-04-09 DIAGNOSIS — N186 End stage renal disease: Secondary | ICD-10-CM | POA: Diagnosis not present

## 2021-04-09 DIAGNOSIS — D631 Anemia in chronic kidney disease: Secondary | ICD-10-CM | POA: Diagnosis not present

## 2021-04-09 DIAGNOSIS — D509 Iron deficiency anemia, unspecified: Secondary | ICD-10-CM | POA: Diagnosis not present

## 2021-04-11 DIAGNOSIS — D631 Anemia in chronic kidney disease: Secondary | ICD-10-CM | POA: Diagnosis not present

## 2021-04-11 DIAGNOSIS — N186 End stage renal disease: Secondary | ICD-10-CM | POA: Diagnosis not present

## 2021-04-11 DIAGNOSIS — Z992 Dependence on renal dialysis: Secondary | ICD-10-CM | POA: Diagnosis not present

## 2021-04-11 DIAGNOSIS — N2581 Secondary hyperparathyroidism of renal origin: Secondary | ICD-10-CM | POA: Diagnosis not present

## 2021-04-11 DIAGNOSIS — D509 Iron deficiency anemia, unspecified: Secondary | ICD-10-CM | POA: Diagnosis not present

## 2021-04-14 DIAGNOSIS — D509 Iron deficiency anemia, unspecified: Secondary | ICD-10-CM | POA: Diagnosis not present

## 2021-04-14 DIAGNOSIS — N186 End stage renal disease: Secondary | ICD-10-CM | POA: Diagnosis not present

## 2021-04-14 DIAGNOSIS — Z992 Dependence on renal dialysis: Secondary | ICD-10-CM | POA: Diagnosis not present

## 2021-04-14 DIAGNOSIS — N2581 Secondary hyperparathyroidism of renal origin: Secondary | ICD-10-CM | POA: Diagnosis not present

## 2021-04-14 DIAGNOSIS — D631 Anemia in chronic kidney disease: Secondary | ICD-10-CM | POA: Diagnosis not present

## 2021-04-16 DIAGNOSIS — N2581 Secondary hyperparathyroidism of renal origin: Secondary | ICD-10-CM | POA: Diagnosis not present

## 2021-04-16 DIAGNOSIS — D631 Anemia in chronic kidney disease: Secondary | ICD-10-CM | POA: Diagnosis not present

## 2021-04-16 DIAGNOSIS — N186 End stage renal disease: Secondary | ICD-10-CM | POA: Diagnosis not present

## 2021-04-16 DIAGNOSIS — Z992 Dependence on renal dialysis: Secondary | ICD-10-CM | POA: Diagnosis not present

## 2021-04-16 DIAGNOSIS — D509 Iron deficiency anemia, unspecified: Secondary | ICD-10-CM | POA: Diagnosis not present

## 2021-04-18 DIAGNOSIS — N186 End stage renal disease: Secondary | ICD-10-CM | POA: Diagnosis not present

## 2021-04-18 DIAGNOSIS — Z992 Dependence on renal dialysis: Secondary | ICD-10-CM | POA: Diagnosis not present

## 2021-04-18 DIAGNOSIS — N2581 Secondary hyperparathyroidism of renal origin: Secondary | ICD-10-CM | POA: Diagnosis not present

## 2021-04-18 DIAGNOSIS — D509 Iron deficiency anemia, unspecified: Secondary | ICD-10-CM | POA: Diagnosis not present

## 2021-04-18 DIAGNOSIS — D631 Anemia in chronic kidney disease: Secondary | ICD-10-CM | POA: Diagnosis not present

## 2021-04-21 DIAGNOSIS — D509 Iron deficiency anemia, unspecified: Secondary | ICD-10-CM | POA: Diagnosis not present

## 2021-04-21 DIAGNOSIS — N186 End stage renal disease: Secondary | ICD-10-CM | POA: Diagnosis not present

## 2021-04-21 DIAGNOSIS — Z992 Dependence on renal dialysis: Secondary | ICD-10-CM | POA: Diagnosis not present

## 2021-04-21 DIAGNOSIS — N2581 Secondary hyperparathyroidism of renal origin: Secondary | ICD-10-CM | POA: Diagnosis not present

## 2021-04-21 DIAGNOSIS — D631 Anemia in chronic kidney disease: Secondary | ICD-10-CM | POA: Diagnosis not present

## 2021-04-23 DIAGNOSIS — N186 End stage renal disease: Secondary | ICD-10-CM | POA: Diagnosis not present

## 2021-04-23 DIAGNOSIS — N2581 Secondary hyperparathyroidism of renal origin: Secondary | ICD-10-CM | POA: Diagnosis not present

## 2021-04-23 DIAGNOSIS — D509 Iron deficiency anemia, unspecified: Secondary | ICD-10-CM | POA: Diagnosis not present

## 2021-04-23 DIAGNOSIS — Z992 Dependence on renal dialysis: Secondary | ICD-10-CM | POA: Diagnosis not present

## 2021-04-23 DIAGNOSIS — D631 Anemia in chronic kidney disease: Secondary | ICD-10-CM | POA: Diagnosis not present

## 2021-04-25 DIAGNOSIS — D631 Anemia in chronic kidney disease: Secondary | ICD-10-CM | POA: Diagnosis not present

## 2021-04-25 DIAGNOSIS — N186 End stage renal disease: Secondary | ICD-10-CM | POA: Diagnosis not present

## 2021-04-25 DIAGNOSIS — D509 Iron deficiency anemia, unspecified: Secondary | ICD-10-CM | POA: Diagnosis not present

## 2021-04-25 DIAGNOSIS — Z992 Dependence on renal dialysis: Secondary | ICD-10-CM | POA: Diagnosis not present

## 2021-04-25 DIAGNOSIS — N2581 Secondary hyperparathyroidism of renal origin: Secondary | ICD-10-CM | POA: Diagnosis not present

## 2021-04-28 DIAGNOSIS — Z992 Dependence on renal dialysis: Secondary | ICD-10-CM | POA: Diagnosis not present

## 2021-04-28 DIAGNOSIS — N186 End stage renal disease: Secondary | ICD-10-CM | POA: Diagnosis not present

## 2021-04-28 DIAGNOSIS — D631 Anemia in chronic kidney disease: Secondary | ICD-10-CM | POA: Diagnosis not present

## 2021-04-28 DIAGNOSIS — D509 Iron deficiency anemia, unspecified: Secondary | ICD-10-CM | POA: Diagnosis not present

## 2021-04-28 DIAGNOSIS — N2581 Secondary hyperparathyroidism of renal origin: Secondary | ICD-10-CM | POA: Diagnosis not present

## 2021-04-29 DIAGNOSIS — R918 Other nonspecific abnormal finding of lung field: Secondary | ICD-10-CM | POA: Diagnosis not present

## 2021-04-29 DIAGNOSIS — C641 Malignant neoplasm of right kidney, except renal pelvis: Secondary | ICD-10-CM | POA: Diagnosis not present

## 2021-04-29 DIAGNOSIS — Z5112 Encounter for antineoplastic immunotherapy: Secondary | ICD-10-CM | POA: Diagnosis not present

## 2021-04-30 DIAGNOSIS — N2581 Secondary hyperparathyroidism of renal origin: Secondary | ICD-10-CM | POA: Diagnosis not present

## 2021-04-30 DIAGNOSIS — D509 Iron deficiency anemia, unspecified: Secondary | ICD-10-CM | POA: Diagnosis not present

## 2021-04-30 DIAGNOSIS — Z992 Dependence on renal dialysis: Secondary | ICD-10-CM | POA: Diagnosis not present

## 2021-04-30 DIAGNOSIS — N186 End stage renal disease: Secondary | ICD-10-CM | POA: Diagnosis not present

## 2021-04-30 DIAGNOSIS — D631 Anemia in chronic kidney disease: Secondary | ICD-10-CM | POA: Diagnosis not present

## 2021-05-02 DIAGNOSIS — D509 Iron deficiency anemia, unspecified: Secondary | ICD-10-CM | POA: Diagnosis not present

## 2021-05-02 DIAGNOSIS — N2581 Secondary hyperparathyroidism of renal origin: Secondary | ICD-10-CM | POA: Diagnosis not present

## 2021-05-02 DIAGNOSIS — N186 End stage renal disease: Secondary | ICD-10-CM | POA: Diagnosis not present

## 2021-05-02 DIAGNOSIS — Z992 Dependence on renal dialysis: Secondary | ICD-10-CM | POA: Diagnosis not present

## 2021-05-02 DIAGNOSIS — D631 Anemia in chronic kidney disease: Secondary | ICD-10-CM | POA: Diagnosis not present

## 2021-05-04 DIAGNOSIS — Z992 Dependence on renal dialysis: Secondary | ICD-10-CM | POA: Diagnosis not present

## 2021-05-04 DIAGNOSIS — C649 Malignant neoplasm of unspecified kidney, except renal pelvis: Secondary | ICD-10-CM | POA: Diagnosis not present

## 2021-05-04 DIAGNOSIS — N186 End stage renal disease: Secondary | ICD-10-CM | POA: Diagnosis not present

## 2021-05-05 DIAGNOSIS — N2581 Secondary hyperparathyroidism of renal origin: Secondary | ICD-10-CM | POA: Diagnosis not present

## 2021-05-05 DIAGNOSIS — Z992 Dependence on renal dialysis: Secondary | ICD-10-CM | POA: Diagnosis not present

## 2021-05-05 DIAGNOSIS — N186 End stage renal disease: Secondary | ICD-10-CM | POA: Diagnosis not present

## 2021-05-05 DIAGNOSIS — D509 Iron deficiency anemia, unspecified: Secondary | ICD-10-CM | POA: Diagnosis not present

## 2021-05-07 DIAGNOSIS — N2581 Secondary hyperparathyroidism of renal origin: Secondary | ICD-10-CM | POA: Diagnosis not present

## 2021-05-07 DIAGNOSIS — Z992 Dependence on renal dialysis: Secondary | ICD-10-CM | POA: Diagnosis not present

## 2021-05-07 DIAGNOSIS — D509 Iron deficiency anemia, unspecified: Secondary | ICD-10-CM | POA: Diagnosis not present

## 2021-05-07 DIAGNOSIS — N186 End stage renal disease: Secondary | ICD-10-CM | POA: Diagnosis not present

## 2021-05-09 DIAGNOSIS — D509 Iron deficiency anemia, unspecified: Secondary | ICD-10-CM | POA: Diagnosis not present

## 2021-05-09 DIAGNOSIS — Z992 Dependence on renal dialysis: Secondary | ICD-10-CM | POA: Diagnosis not present

## 2021-05-09 DIAGNOSIS — N186 End stage renal disease: Secondary | ICD-10-CM | POA: Diagnosis not present

## 2021-05-09 DIAGNOSIS — N2581 Secondary hyperparathyroidism of renal origin: Secondary | ICD-10-CM | POA: Diagnosis not present

## 2021-05-12 DIAGNOSIS — N2581 Secondary hyperparathyroidism of renal origin: Secondary | ICD-10-CM | POA: Diagnosis not present

## 2021-05-12 DIAGNOSIS — D509 Iron deficiency anemia, unspecified: Secondary | ICD-10-CM | POA: Diagnosis not present

## 2021-05-12 DIAGNOSIS — N186 End stage renal disease: Secondary | ICD-10-CM | POA: Diagnosis not present

## 2021-05-12 DIAGNOSIS — Z992 Dependence on renal dialysis: Secondary | ICD-10-CM | POA: Diagnosis not present

## 2021-05-14 DIAGNOSIS — Z992 Dependence on renal dialysis: Secondary | ICD-10-CM | POA: Diagnosis not present

## 2021-05-14 DIAGNOSIS — N2581 Secondary hyperparathyroidism of renal origin: Secondary | ICD-10-CM | POA: Diagnosis not present

## 2021-05-14 DIAGNOSIS — N186 End stage renal disease: Secondary | ICD-10-CM | POA: Diagnosis not present

## 2021-05-14 DIAGNOSIS — D509 Iron deficiency anemia, unspecified: Secondary | ICD-10-CM | POA: Diagnosis not present

## 2021-05-16 DIAGNOSIS — Z992 Dependence on renal dialysis: Secondary | ICD-10-CM | POA: Diagnosis not present

## 2021-05-16 DIAGNOSIS — D509 Iron deficiency anemia, unspecified: Secondary | ICD-10-CM | POA: Diagnosis not present

## 2021-05-16 DIAGNOSIS — N2581 Secondary hyperparathyroidism of renal origin: Secondary | ICD-10-CM | POA: Diagnosis not present

## 2021-05-16 DIAGNOSIS — N186 End stage renal disease: Secondary | ICD-10-CM | POA: Diagnosis not present

## 2021-05-19 DIAGNOSIS — N2581 Secondary hyperparathyroidism of renal origin: Secondary | ICD-10-CM | POA: Diagnosis not present

## 2021-05-19 DIAGNOSIS — N186 End stage renal disease: Secondary | ICD-10-CM | POA: Diagnosis not present

## 2021-05-19 DIAGNOSIS — D509 Iron deficiency anemia, unspecified: Secondary | ICD-10-CM | POA: Diagnosis not present

## 2021-05-19 DIAGNOSIS — Z992 Dependence on renal dialysis: Secondary | ICD-10-CM | POA: Diagnosis not present

## 2021-05-21 DIAGNOSIS — N186 End stage renal disease: Secondary | ICD-10-CM | POA: Diagnosis not present

## 2021-05-21 DIAGNOSIS — N2581 Secondary hyperparathyroidism of renal origin: Secondary | ICD-10-CM | POA: Diagnosis not present

## 2021-05-21 DIAGNOSIS — Z992 Dependence on renal dialysis: Secondary | ICD-10-CM | POA: Diagnosis not present

## 2021-05-21 DIAGNOSIS — D509 Iron deficiency anemia, unspecified: Secondary | ICD-10-CM | POA: Diagnosis not present

## 2021-05-23 DIAGNOSIS — N2581 Secondary hyperparathyroidism of renal origin: Secondary | ICD-10-CM | POA: Diagnosis not present

## 2021-05-23 DIAGNOSIS — Z992 Dependence on renal dialysis: Secondary | ICD-10-CM | POA: Diagnosis not present

## 2021-05-23 DIAGNOSIS — N186 End stage renal disease: Secondary | ICD-10-CM | POA: Diagnosis not present

## 2021-05-23 DIAGNOSIS — D509 Iron deficiency anemia, unspecified: Secondary | ICD-10-CM | POA: Diagnosis not present

## 2021-05-26 DIAGNOSIS — N186 End stage renal disease: Secondary | ICD-10-CM | POA: Diagnosis not present

## 2021-05-26 DIAGNOSIS — Z992 Dependence on renal dialysis: Secondary | ICD-10-CM | POA: Diagnosis not present

## 2021-05-26 DIAGNOSIS — D509 Iron deficiency anemia, unspecified: Secondary | ICD-10-CM | POA: Diagnosis not present

## 2021-05-26 DIAGNOSIS — N2581 Secondary hyperparathyroidism of renal origin: Secondary | ICD-10-CM | POA: Diagnosis not present

## 2021-05-27 DIAGNOSIS — C641 Malignant neoplasm of right kidney, except renal pelvis: Secondary | ICD-10-CM | POA: Diagnosis not present

## 2021-05-27 DIAGNOSIS — Z5112 Encounter for antineoplastic immunotherapy: Secondary | ICD-10-CM | POA: Diagnosis not present

## 2021-05-27 DIAGNOSIS — Z79899 Other long term (current) drug therapy: Secondary | ICD-10-CM | POA: Diagnosis not present

## 2021-05-28 DIAGNOSIS — D509 Iron deficiency anemia, unspecified: Secondary | ICD-10-CM | POA: Diagnosis not present

## 2021-05-28 DIAGNOSIS — N186 End stage renal disease: Secondary | ICD-10-CM | POA: Diagnosis not present

## 2021-05-28 DIAGNOSIS — N2581 Secondary hyperparathyroidism of renal origin: Secondary | ICD-10-CM | POA: Diagnosis not present

## 2021-05-28 DIAGNOSIS — Z992 Dependence on renal dialysis: Secondary | ICD-10-CM | POA: Diagnosis not present

## 2021-05-30 DIAGNOSIS — Z992 Dependence on renal dialysis: Secondary | ICD-10-CM | POA: Diagnosis not present

## 2021-05-30 DIAGNOSIS — N186 End stage renal disease: Secondary | ICD-10-CM | POA: Diagnosis not present

## 2021-05-30 DIAGNOSIS — D509 Iron deficiency anemia, unspecified: Secondary | ICD-10-CM | POA: Diagnosis not present

## 2021-05-30 DIAGNOSIS — N2581 Secondary hyperparathyroidism of renal origin: Secondary | ICD-10-CM | POA: Diagnosis not present

## 2021-06-02 DIAGNOSIS — D509 Iron deficiency anemia, unspecified: Secondary | ICD-10-CM | POA: Diagnosis not present

## 2021-06-02 DIAGNOSIS — N2581 Secondary hyperparathyroidism of renal origin: Secondary | ICD-10-CM | POA: Diagnosis not present

## 2021-06-02 DIAGNOSIS — N186 End stage renal disease: Secondary | ICD-10-CM | POA: Diagnosis not present

## 2021-06-02 DIAGNOSIS — Z992 Dependence on renal dialysis: Secondary | ICD-10-CM | POA: Diagnosis not present

## 2021-06-04 DIAGNOSIS — Z992 Dependence on renal dialysis: Secondary | ICD-10-CM | POA: Diagnosis not present

## 2021-06-04 DIAGNOSIS — N186 End stage renal disease: Secondary | ICD-10-CM | POA: Diagnosis not present

## 2021-06-04 DIAGNOSIS — N2581 Secondary hyperparathyroidism of renal origin: Secondary | ICD-10-CM | POA: Diagnosis not present

## 2021-06-04 DIAGNOSIS — D509 Iron deficiency anemia, unspecified: Secondary | ICD-10-CM | POA: Diagnosis not present

## 2021-06-04 DIAGNOSIS — C649 Malignant neoplasm of unspecified kidney, except renal pelvis: Secondary | ICD-10-CM | POA: Diagnosis not present

## 2021-06-06 DIAGNOSIS — N2581 Secondary hyperparathyroidism of renal origin: Secondary | ICD-10-CM | POA: Diagnosis not present

## 2021-06-06 DIAGNOSIS — N186 End stage renal disease: Secondary | ICD-10-CM | POA: Diagnosis not present

## 2021-06-06 DIAGNOSIS — Z992 Dependence on renal dialysis: Secondary | ICD-10-CM | POA: Diagnosis not present

## 2021-06-06 DIAGNOSIS — D509 Iron deficiency anemia, unspecified: Secondary | ICD-10-CM | POA: Diagnosis not present

## 2021-06-09 DIAGNOSIS — N186 End stage renal disease: Secondary | ICD-10-CM | POA: Diagnosis not present

## 2021-06-09 DIAGNOSIS — N2581 Secondary hyperparathyroidism of renal origin: Secondary | ICD-10-CM | POA: Diagnosis not present

## 2021-06-09 DIAGNOSIS — Z992 Dependence on renal dialysis: Secondary | ICD-10-CM | POA: Diagnosis not present

## 2021-06-09 DIAGNOSIS — D509 Iron deficiency anemia, unspecified: Secondary | ICD-10-CM | POA: Diagnosis not present

## 2021-06-11 DIAGNOSIS — N186 End stage renal disease: Secondary | ICD-10-CM | POA: Diagnosis not present

## 2021-06-11 DIAGNOSIS — Z992 Dependence on renal dialysis: Secondary | ICD-10-CM | POA: Diagnosis not present

## 2021-06-11 DIAGNOSIS — D509 Iron deficiency anemia, unspecified: Secondary | ICD-10-CM | POA: Diagnosis not present

## 2021-06-11 DIAGNOSIS — N2581 Secondary hyperparathyroidism of renal origin: Secondary | ICD-10-CM | POA: Diagnosis not present

## 2021-06-13 DIAGNOSIS — N186 End stage renal disease: Secondary | ICD-10-CM | POA: Diagnosis not present

## 2021-06-13 DIAGNOSIS — N2581 Secondary hyperparathyroidism of renal origin: Secondary | ICD-10-CM | POA: Diagnosis not present

## 2021-06-13 DIAGNOSIS — D509 Iron deficiency anemia, unspecified: Secondary | ICD-10-CM | POA: Diagnosis not present

## 2021-06-13 DIAGNOSIS — Z992 Dependence on renal dialysis: Secondary | ICD-10-CM | POA: Diagnosis not present

## 2021-06-16 DIAGNOSIS — N186 End stage renal disease: Secondary | ICD-10-CM | POA: Diagnosis not present

## 2021-06-16 DIAGNOSIS — D509 Iron deficiency anemia, unspecified: Secondary | ICD-10-CM | POA: Diagnosis not present

## 2021-06-16 DIAGNOSIS — N2581 Secondary hyperparathyroidism of renal origin: Secondary | ICD-10-CM | POA: Diagnosis not present

## 2021-06-16 DIAGNOSIS — Z992 Dependence on renal dialysis: Secondary | ICD-10-CM | POA: Diagnosis not present

## 2021-06-18 DIAGNOSIS — N2581 Secondary hyperparathyroidism of renal origin: Secondary | ICD-10-CM | POA: Diagnosis not present

## 2021-06-18 DIAGNOSIS — Z992 Dependence on renal dialysis: Secondary | ICD-10-CM | POA: Diagnosis not present

## 2021-06-18 DIAGNOSIS — N186 End stage renal disease: Secondary | ICD-10-CM | POA: Diagnosis not present

## 2021-06-18 DIAGNOSIS — D509 Iron deficiency anemia, unspecified: Secondary | ICD-10-CM | POA: Diagnosis not present

## 2021-06-20 DIAGNOSIS — D509 Iron deficiency anemia, unspecified: Secondary | ICD-10-CM | POA: Diagnosis not present

## 2021-06-20 DIAGNOSIS — Z992 Dependence on renal dialysis: Secondary | ICD-10-CM | POA: Diagnosis not present

## 2021-06-20 DIAGNOSIS — N186 End stage renal disease: Secondary | ICD-10-CM | POA: Diagnosis not present

## 2021-06-20 DIAGNOSIS — N2581 Secondary hyperparathyroidism of renal origin: Secondary | ICD-10-CM | POA: Diagnosis not present

## 2021-06-23 DIAGNOSIS — Z992 Dependence on renal dialysis: Secondary | ICD-10-CM | POA: Diagnosis not present

## 2021-06-23 DIAGNOSIS — N2581 Secondary hyperparathyroidism of renal origin: Secondary | ICD-10-CM | POA: Diagnosis not present

## 2021-06-23 DIAGNOSIS — N186 End stage renal disease: Secondary | ICD-10-CM | POA: Diagnosis not present

## 2021-06-23 DIAGNOSIS — D509 Iron deficiency anemia, unspecified: Secondary | ICD-10-CM | POA: Diagnosis not present

## 2021-06-25 DIAGNOSIS — Z992 Dependence on renal dialysis: Secondary | ICD-10-CM | POA: Diagnosis not present

## 2021-06-25 DIAGNOSIS — N2581 Secondary hyperparathyroidism of renal origin: Secondary | ICD-10-CM | POA: Diagnosis not present

## 2021-06-25 DIAGNOSIS — N186 End stage renal disease: Secondary | ICD-10-CM | POA: Diagnosis not present

## 2021-06-25 DIAGNOSIS — D509 Iron deficiency anemia, unspecified: Secondary | ICD-10-CM | POA: Diagnosis not present

## 2021-06-27 DIAGNOSIS — N2581 Secondary hyperparathyroidism of renal origin: Secondary | ICD-10-CM | POA: Diagnosis not present

## 2021-06-27 DIAGNOSIS — D509 Iron deficiency anemia, unspecified: Secondary | ICD-10-CM | POA: Diagnosis not present

## 2021-06-27 DIAGNOSIS — N186 End stage renal disease: Secondary | ICD-10-CM | POA: Diagnosis not present

## 2021-06-27 DIAGNOSIS — Z992 Dependence on renal dialysis: Secondary | ICD-10-CM | POA: Diagnosis not present

## 2021-06-30 DIAGNOSIS — N2581 Secondary hyperparathyroidism of renal origin: Secondary | ICD-10-CM | POA: Diagnosis not present

## 2021-06-30 DIAGNOSIS — D509 Iron deficiency anemia, unspecified: Secondary | ICD-10-CM | POA: Diagnosis not present

## 2021-06-30 DIAGNOSIS — Z992 Dependence on renal dialysis: Secondary | ICD-10-CM | POA: Diagnosis not present

## 2021-06-30 DIAGNOSIS — N186 End stage renal disease: Secondary | ICD-10-CM | POA: Diagnosis not present

## 2021-07-01 DIAGNOSIS — R918 Other nonspecific abnormal finding of lung field: Secondary | ICD-10-CM | POA: Diagnosis not present

## 2021-07-01 DIAGNOSIS — C641 Malignant neoplasm of right kidney, except renal pelvis: Secondary | ICD-10-CM | POA: Diagnosis not present

## 2021-07-02 DIAGNOSIS — Z992 Dependence on renal dialysis: Secondary | ICD-10-CM | POA: Diagnosis not present

## 2021-07-02 DIAGNOSIS — N2581 Secondary hyperparathyroidism of renal origin: Secondary | ICD-10-CM | POA: Diagnosis not present

## 2021-07-02 DIAGNOSIS — D509 Iron deficiency anemia, unspecified: Secondary | ICD-10-CM | POA: Diagnosis not present

## 2021-07-02 DIAGNOSIS — N186 End stage renal disease: Secondary | ICD-10-CM | POA: Diagnosis not present

## 2021-07-04 DIAGNOSIS — Z992 Dependence on renal dialysis: Secondary | ICD-10-CM | POA: Diagnosis not present

## 2021-07-04 DIAGNOSIS — N2581 Secondary hyperparathyroidism of renal origin: Secondary | ICD-10-CM | POA: Diagnosis not present

## 2021-07-04 DIAGNOSIS — D509 Iron deficiency anemia, unspecified: Secondary | ICD-10-CM | POA: Diagnosis not present

## 2021-07-04 DIAGNOSIS — C649 Malignant neoplasm of unspecified kidney, except renal pelvis: Secondary | ICD-10-CM | POA: Diagnosis not present

## 2021-07-04 DIAGNOSIS — N186 End stage renal disease: Secondary | ICD-10-CM | POA: Diagnosis not present

## 2021-07-06 ENCOUNTER — Other Ambulatory Visit: Payer: Self-pay | Admitting: *Deleted

## 2021-07-06 NOTE — Patient Outreach (Addendum)
[facility]) Care Management  07/06/2021  Kagen Kunath 1940/03/18 [id number]   [place] coordination  Received a message left by daughter Tammy reporting she picked up pt insulin Tresiba flex touch 200 unit/ml prn 18 units qd today and the cost was elevated from $40 to $105   Outreach to pcp office Dr Marton Redwood Left a message for [place] office assisted with the office pharmacy staff number of [phone]. This was found to be an incorrect number Staff reports they were in [place]   Referral sent via e-mail to external vendor + listed pharmacy staff for external vendor for a request for medication assistance with Tyler Aas as   Outreach to [facility] [phone] Spoke with Lenon Ahmadi *He is in coverage gap Cost now $81.36 + 20+ extra  Updated Tammy daughter and discussed plus encouraged her to go to [employer] online site to complete the patient assistance program application (PAP) She states she will  Confirmed pt saw a male diabetic staff in [place] office in 2021 briefly but not since.    [facility] RN CM continue to collaborate with pt, daughter, pcp office and external vendor staff prn    Joelene Millin L. Lavina Hamman, RN, BSN, [place] Coordinator Office number [phone]

## 2021-07-07 DIAGNOSIS — N2581 Secondary hyperparathyroidism of renal origin: Secondary | ICD-10-CM | POA: Diagnosis not present

## 2021-07-07 DIAGNOSIS — Z992 Dependence on renal dialysis: Secondary | ICD-10-CM | POA: Diagnosis not present

## 2021-07-07 DIAGNOSIS — D509 Iron deficiency anemia, unspecified: Secondary | ICD-10-CM | POA: Diagnosis not present

## 2021-07-07 DIAGNOSIS — N186 End stage renal disease: Secondary | ICD-10-CM | POA: Diagnosis not present

## 2021-07-08 DIAGNOSIS — Z5112 Encounter for antineoplastic immunotherapy: Secondary | ICD-10-CM | POA: Diagnosis not present

## 2021-07-08 DIAGNOSIS — C641 Malignant neoplasm of right kidney, except renal pelvis: Secondary | ICD-10-CM | POA: Diagnosis not present

## 2021-07-09 DIAGNOSIS — Z992 Dependence on renal dialysis: Secondary | ICD-10-CM | POA: Diagnosis not present

## 2021-07-09 DIAGNOSIS — N186 End stage renal disease: Secondary | ICD-10-CM | POA: Diagnosis not present

## 2021-07-09 DIAGNOSIS — N2581 Secondary hyperparathyroidism of renal origin: Secondary | ICD-10-CM | POA: Diagnosis not present

## 2021-07-09 DIAGNOSIS — D509 Iron deficiency anemia, unspecified: Secondary | ICD-10-CM | POA: Diagnosis not present

## 2021-07-11 DIAGNOSIS — D509 Iron deficiency anemia, unspecified: Secondary | ICD-10-CM | POA: Diagnosis not present

## 2021-07-11 DIAGNOSIS — N2581 Secondary hyperparathyroidism of renal origin: Secondary | ICD-10-CM | POA: Diagnosis not present

## 2021-07-11 DIAGNOSIS — N186 End stage renal disease: Secondary | ICD-10-CM | POA: Diagnosis not present

## 2021-07-11 DIAGNOSIS — Z992 Dependence on renal dialysis: Secondary | ICD-10-CM | POA: Diagnosis not present

## 2021-07-14 DIAGNOSIS — D509 Iron deficiency anemia, unspecified: Secondary | ICD-10-CM | POA: Diagnosis not present

## 2021-07-14 DIAGNOSIS — N2581 Secondary hyperparathyroidism of renal origin: Secondary | ICD-10-CM | POA: Diagnosis not present

## 2021-07-14 DIAGNOSIS — N186 End stage renal disease: Secondary | ICD-10-CM | POA: Diagnosis not present

## 2021-07-14 DIAGNOSIS — Z992 Dependence on renal dialysis: Secondary | ICD-10-CM | POA: Diagnosis not present

## 2021-07-16 DIAGNOSIS — D509 Iron deficiency anemia, unspecified: Secondary | ICD-10-CM | POA: Diagnosis not present

## 2021-07-16 DIAGNOSIS — N2581 Secondary hyperparathyroidism of renal origin: Secondary | ICD-10-CM | POA: Diagnosis not present

## 2021-07-16 DIAGNOSIS — Z992 Dependence on renal dialysis: Secondary | ICD-10-CM | POA: Diagnosis not present

## 2021-07-16 DIAGNOSIS — N186 End stage renal disease: Secondary | ICD-10-CM | POA: Diagnosis not present

## 2021-07-18 DIAGNOSIS — N186 End stage renal disease: Secondary | ICD-10-CM | POA: Diagnosis not present

## 2021-07-18 DIAGNOSIS — D509 Iron deficiency anemia, unspecified: Secondary | ICD-10-CM | POA: Diagnosis not present

## 2021-07-18 DIAGNOSIS — Z992 Dependence on renal dialysis: Secondary | ICD-10-CM | POA: Diagnosis not present

## 2021-07-18 DIAGNOSIS — N2581 Secondary hyperparathyroidism of renal origin: Secondary | ICD-10-CM | POA: Diagnosis not present

## 2021-07-21 DIAGNOSIS — N2581 Secondary hyperparathyroidism of renal origin: Secondary | ICD-10-CM | POA: Diagnosis not present

## 2021-07-21 DIAGNOSIS — D509 Iron deficiency anemia, unspecified: Secondary | ICD-10-CM | POA: Diagnosis not present

## 2021-07-21 DIAGNOSIS — N186 End stage renal disease: Secondary | ICD-10-CM | POA: Diagnosis not present

## 2021-07-21 DIAGNOSIS — Z992 Dependence on renal dialysis: Secondary | ICD-10-CM | POA: Diagnosis not present

## 2021-07-23 DIAGNOSIS — N186 End stage renal disease: Secondary | ICD-10-CM | POA: Diagnosis not present

## 2021-07-23 DIAGNOSIS — Z992 Dependence on renal dialysis: Secondary | ICD-10-CM | POA: Diagnosis not present

## 2021-07-23 DIAGNOSIS — D509 Iron deficiency anemia, unspecified: Secondary | ICD-10-CM | POA: Diagnosis not present

## 2021-07-23 DIAGNOSIS — N2581 Secondary hyperparathyroidism of renal origin: Secondary | ICD-10-CM | POA: Diagnosis not present

## 2021-07-25 DIAGNOSIS — N186 End stage renal disease: Secondary | ICD-10-CM | POA: Diagnosis not present

## 2021-07-25 DIAGNOSIS — Z992 Dependence on renal dialysis: Secondary | ICD-10-CM | POA: Diagnosis not present

## 2021-07-25 DIAGNOSIS — N2581 Secondary hyperparathyroidism of renal origin: Secondary | ICD-10-CM | POA: Diagnosis not present

## 2021-07-25 DIAGNOSIS — D509 Iron deficiency anemia, unspecified: Secondary | ICD-10-CM | POA: Diagnosis not present

## 2021-07-28 DIAGNOSIS — N2581 Secondary hyperparathyroidism of renal origin: Secondary | ICD-10-CM | POA: Diagnosis not present

## 2021-07-28 DIAGNOSIS — D509 Iron deficiency anemia, unspecified: Secondary | ICD-10-CM | POA: Diagnosis not present

## 2021-07-28 DIAGNOSIS — N186 End stage renal disease: Secondary | ICD-10-CM | POA: Diagnosis not present

## 2021-07-28 DIAGNOSIS — Z992 Dependence on renal dialysis: Secondary | ICD-10-CM | POA: Diagnosis not present

## 2021-07-30 DIAGNOSIS — D509 Iron deficiency anemia, unspecified: Secondary | ICD-10-CM | POA: Diagnosis not present

## 2021-07-30 DIAGNOSIS — N186 End stage renal disease: Secondary | ICD-10-CM | POA: Diagnosis not present

## 2021-07-30 DIAGNOSIS — N2581 Secondary hyperparathyroidism of renal origin: Secondary | ICD-10-CM | POA: Diagnosis not present

## 2021-07-30 DIAGNOSIS — Z992 Dependence on renal dialysis: Secondary | ICD-10-CM | POA: Diagnosis not present

## 2021-08-01 DIAGNOSIS — N186 End stage renal disease: Secondary | ICD-10-CM | POA: Diagnosis not present

## 2021-08-01 DIAGNOSIS — D509 Iron deficiency anemia, unspecified: Secondary | ICD-10-CM | POA: Diagnosis not present

## 2021-08-01 DIAGNOSIS — N2581 Secondary hyperparathyroidism of renal origin: Secondary | ICD-10-CM | POA: Diagnosis not present

## 2021-08-01 DIAGNOSIS — Z992 Dependence on renal dialysis: Secondary | ICD-10-CM | POA: Diagnosis not present

## 2021-08-03 DIAGNOSIS — E785 Hyperlipidemia, unspecified: Secondary | ICD-10-CM | POA: Diagnosis not present

## 2021-08-03 DIAGNOSIS — N184 Chronic kidney disease, stage 4 (severe): Secondary | ICD-10-CM | POA: Diagnosis not present

## 2021-08-03 DIAGNOSIS — I129 Hypertensive chronic kidney disease with stage 1 through stage 4 chronic kidney disease, or unspecified chronic kidney disease: Secondary | ICD-10-CM | POA: Diagnosis not present

## 2021-08-03 DIAGNOSIS — I10 Essential (primary) hypertension: Secondary | ICD-10-CM | POA: Diagnosis not present

## 2021-08-04 DIAGNOSIS — C641 Malignant neoplasm of right kidney, except renal pelvis: Secondary | ICD-10-CM | POA: Diagnosis not present

## 2021-08-04 DIAGNOSIS — L309 Dermatitis, unspecified: Secondary | ICD-10-CM | POA: Diagnosis not present

## 2021-08-04 DIAGNOSIS — Z23 Encounter for immunization: Secondary | ICD-10-CM | POA: Diagnosis not present

## 2021-08-04 DIAGNOSIS — N186 End stage renal disease: Secondary | ICD-10-CM | POA: Diagnosis not present

## 2021-08-04 DIAGNOSIS — Z87891 Personal history of nicotine dependence: Secondary | ICD-10-CM | POA: Diagnosis not present

## 2021-08-04 DIAGNOSIS — D509 Iron deficiency anemia, unspecified: Secondary | ICD-10-CM | POA: Diagnosis not present

## 2021-08-04 DIAGNOSIS — R600 Localized edema: Secondary | ICD-10-CM | POA: Diagnosis not present

## 2021-08-04 DIAGNOSIS — I120 Hypertensive chronic kidney disease with stage 5 chronic kidney disease or end stage renal disease: Secondary | ICD-10-CM | POA: Diagnosis not present

## 2021-08-04 DIAGNOSIS — N2581 Secondary hyperparathyroidism of renal origin: Secondary | ICD-10-CM | POA: Diagnosis not present

## 2021-08-04 DIAGNOSIS — Z992 Dependence on renal dialysis: Secondary | ICD-10-CM | POA: Diagnosis not present

## 2021-08-04 DIAGNOSIS — C649 Malignant neoplasm of unspecified kidney, except renal pelvis: Secondary | ICD-10-CM | POA: Diagnosis not present

## 2021-08-04 DIAGNOSIS — Z79899 Other long term (current) drug therapy: Secondary | ICD-10-CM | POA: Diagnosis not present

## 2021-08-04 DIAGNOSIS — Z5112 Encounter for antineoplastic immunotherapy: Secondary | ICD-10-CM | POA: Diagnosis not present

## 2021-08-05 DIAGNOSIS — Z992 Dependence on renal dialysis: Secondary | ICD-10-CM | POA: Diagnosis not present

## 2021-08-05 DIAGNOSIS — I120 Hypertensive chronic kidney disease with stage 5 chronic kidney disease or end stage renal disease: Secondary | ICD-10-CM | POA: Diagnosis not present

## 2021-08-05 DIAGNOSIS — Z5112 Encounter for antineoplastic immunotherapy: Secondary | ICD-10-CM | POA: Diagnosis not present

## 2021-08-05 DIAGNOSIS — R600 Localized edema: Secondary | ICD-10-CM | POA: Diagnosis not present

## 2021-08-05 DIAGNOSIS — N186 End stage renal disease: Secondary | ICD-10-CM | POA: Diagnosis not present

## 2021-08-05 DIAGNOSIS — Z79899 Other long term (current) drug therapy: Secondary | ICD-10-CM | POA: Diagnosis not present

## 2021-08-05 DIAGNOSIS — C641 Malignant neoplasm of right kidney, except renal pelvis: Secondary | ICD-10-CM | POA: Diagnosis not present

## 2021-08-05 DIAGNOSIS — Z87891 Personal history of nicotine dependence: Secondary | ICD-10-CM | POA: Diagnosis not present

## 2021-08-05 DIAGNOSIS — L309 Dermatitis, unspecified: Secondary | ICD-10-CM | POA: Diagnosis not present

## 2021-08-06 DIAGNOSIS — N186 End stage renal disease: Secondary | ICD-10-CM | POA: Diagnosis not present

## 2021-08-06 DIAGNOSIS — N2581 Secondary hyperparathyroidism of renal origin: Secondary | ICD-10-CM | POA: Diagnosis not present

## 2021-08-06 DIAGNOSIS — Z992 Dependence on renal dialysis: Secondary | ICD-10-CM | POA: Diagnosis not present

## 2021-08-06 DIAGNOSIS — Z23 Encounter for immunization: Secondary | ICD-10-CM | POA: Diagnosis not present

## 2021-08-06 DIAGNOSIS — D509 Iron deficiency anemia, unspecified: Secondary | ICD-10-CM | POA: Diagnosis not present

## 2021-08-08 DIAGNOSIS — Z992 Dependence on renal dialysis: Secondary | ICD-10-CM | POA: Diagnosis not present

## 2021-08-08 DIAGNOSIS — N186 End stage renal disease: Secondary | ICD-10-CM | POA: Diagnosis not present

## 2021-08-08 DIAGNOSIS — N2581 Secondary hyperparathyroidism of renal origin: Secondary | ICD-10-CM | POA: Diagnosis not present

## 2021-08-08 DIAGNOSIS — D509 Iron deficiency anemia, unspecified: Secondary | ICD-10-CM | POA: Diagnosis not present

## 2021-08-08 DIAGNOSIS — Z23 Encounter for immunization: Secondary | ICD-10-CM | POA: Diagnosis not present

## 2021-08-10 DIAGNOSIS — D649 Anemia, unspecified: Secondary | ICD-10-CM | POA: Diagnosis not present

## 2021-08-10 DIAGNOSIS — Z125 Encounter for screening for malignant neoplasm of prostate: Secondary | ICD-10-CM | POA: Diagnosis not present

## 2021-08-10 DIAGNOSIS — R7989 Other specified abnormal findings of blood chemistry: Secondary | ICD-10-CM | POA: Diagnosis not present

## 2021-08-10 DIAGNOSIS — I10 Essential (primary) hypertension: Secondary | ICD-10-CM | POA: Diagnosis not present

## 2021-08-10 DIAGNOSIS — M109 Gout, unspecified: Secondary | ICD-10-CM | POA: Diagnosis not present

## 2021-08-10 DIAGNOSIS — Z794 Long term (current) use of insulin: Secondary | ICD-10-CM | POA: Diagnosis not present

## 2021-08-10 DIAGNOSIS — E785 Hyperlipidemia, unspecified: Secondary | ICD-10-CM | POA: Diagnosis not present

## 2021-08-11 DIAGNOSIS — D509 Iron deficiency anemia, unspecified: Secondary | ICD-10-CM | POA: Diagnosis not present

## 2021-08-11 DIAGNOSIS — N2581 Secondary hyperparathyroidism of renal origin: Secondary | ICD-10-CM | POA: Diagnosis not present

## 2021-08-11 DIAGNOSIS — N186 End stage renal disease: Secondary | ICD-10-CM | POA: Diagnosis not present

## 2021-08-11 DIAGNOSIS — Z992 Dependence on renal dialysis: Secondary | ICD-10-CM | POA: Diagnosis not present

## 2021-08-11 DIAGNOSIS — Z23 Encounter for immunization: Secondary | ICD-10-CM | POA: Diagnosis not present

## 2021-08-13 DIAGNOSIS — N186 End stage renal disease: Secondary | ICD-10-CM | POA: Diagnosis not present

## 2021-08-13 DIAGNOSIS — D509 Iron deficiency anemia, unspecified: Secondary | ICD-10-CM | POA: Diagnosis not present

## 2021-08-13 DIAGNOSIS — Z992 Dependence on renal dialysis: Secondary | ICD-10-CM | POA: Diagnosis not present

## 2021-08-13 DIAGNOSIS — N2581 Secondary hyperparathyroidism of renal origin: Secondary | ICD-10-CM | POA: Diagnosis not present

## 2021-08-13 DIAGNOSIS — Z23 Encounter for immunization: Secondary | ICD-10-CM | POA: Diagnosis not present

## 2021-08-15 DIAGNOSIS — D509 Iron deficiency anemia, unspecified: Secondary | ICD-10-CM | POA: Diagnosis not present

## 2021-08-15 DIAGNOSIS — Z992 Dependence on renal dialysis: Secondary | ICD-10-CM | POA: Diagnosis not present

## 2021-08-15 DIAGNOSIS — N186 End stage renal disease: Secondary | ICD-10-CM | POA: Diagnosis not present

## 2021-08-15 DIAGNOSIS — Z23 Encounter for immunization: Secondary | ICD-10-CM | POA: Diagnosis not present

## 2021-08-15 DIAGNOSIS — N2581 Secondary hyperparathyroidism of renal origin: Secondary | ICD-10-CM | POA: Diagnosis not present

## 2021-08-17 DIAGNOSIS — D649 Anemia, unspecified: Secondary | ICD-10-CM | POA: Diagnosis not present

## 2021-08-17 DIAGNOSIS — C7989 Secondary malignant neoplasm of other specified sites: Secondary | ICD-10-CM | POA: Diagnosis not present

## 2021-08-17 DIAGNOSIS — Z1339 Encounter for screening examination for other mental health and behavioral disorders: Secondary | ICD-10-CM | POA: Diagnosis not present

## 2021-08-17 DIAGNOSIS — I69954 Hemiplegia and hemiparesis following unspecified cerebrovascular disease affecting left non-dominant side: Secondary | ICD-10-CM | POA: Diagnosis not present

## 2021-08-17 DIAGNOSIS — C641 Malignant neoplasm of right kidney, except renal pelvis: Secondary | ICD-10-CM | POA: Diagnosis not present

## 2021-08-17 DIAGNOSIS — I770 Arteriovenous fistula, acquired: Secondary | ICD-10-CM | POA: Diagnosis not present

## 2021-08-17 DIAGNOSIS — Z794 Long term (current) use of insulin: Secondary | ICD-10-CM | POA: Diagnosis not present

## 2021-08-17 DIAGNOSIS — I82220 Acute embolism and thrombosis of inferior vena cava: Secondary | ICD-10-CM | POA: Diagnosis not present

## 2021-08-17 DIAGNOSIS — E1139 Type 2 diabetes mellitus with other diabetic ophthalmic complication: Secondary | ICD-10-CM | POA: Diagnosis not present

## 2021-08-17 DIAGNOSIS — E1129 Type 2 diabetes mellitus with other diabetic kidney complication: Secondary | ICD-10-CM | POA: Diagnosis not present

## 2021-08-17 DIAGNOSIS — I129 Hypertensive chronic kidney disease with stage 1 through stage 4 chronic kidney disease, or unspecified chronic kidney disease: Secondary | ICD-10-CM | POA: Diagnosis not present

## 2021-08-17 DIAGNOSIS — Z0000 Encounter for general adult medical examination without abnormal findings: Secondary | ICD-10-CM | POA: Diagnosis not present

## 2021-08-17 DIAGNOSIS — N186 End stage renal disease: Secondary | ICD-10-CM | POA: Diagnosis not present

## 2021-08-17 DIAGNOSIS — Z1331 Encounter for screening for depression: Secondary | ICD-10-CM | POA: Diagnosis not present

## 2021-08-17 DIAGNOSIS — Z992 Dependence on renal dialysis: Secondary | ICD-10-CM | POA: Diagnosis not present

## 2021-08-18 DIAGNOSIS — Z992 Dependence on renal dialysis: Secondary | ICD-10-CM | POA: Diagnosis not present

## 2021-08-18 DIAGNOSIS — N186 End stage renal disease: Secondary | ICD-10-CM | POA: Diagnosis not present

## 2021-08-18 DIAGNOSIS — D509 Iron deficiency anemia, unspecified: Secondary | ICD-10-CM | POA: Diagnosis not present

## 2021-08-18 DIAGNOSIS — Z23 Encounter for immunization: Secondary | ICD-10-CM | POA: Diagnosis not present

## 2021-08-18 DIAGNOSIS — N2581 Secondary hyperparathyroidism of renal origin: Secondary | ICD-10-CM | POA: Diagnosis not present

## 2021-08-20 DIAGNOSIS — Z23 Encounter for immunization: Secondary | ICD-10-CM | POA: Diagnosis not present

## 2021-08-20 DIAGNOSIS — Z992 Dependence on renal dialysis: Secondary | ICD-10-CM | POA: Diagnosis not present

## 2021-08-20 DIAGNOSIS — N186 End stage renal disease: Secondary | ICD-10-CM | POA: Diagnosis not present

## 2021-08-20 DIAGNOSIS — D509 Iron deficiency anemia, unspecified: Secondary | ICD-10-CM | POA: Diagnosis not present

## 2021-08-20 DIAGNOSIS — N2581 Secondary hyperparathyroidism of renal origin: Secondary | ICD-10-CM | POA: Diagnosis not present

## 2021-08-22 DIAGNOSIS — D509 Iron deficiency anemia, unspecified: Secondary | ICD-10-CM | POA: Diagnosis not present

## 2021-08-22 DIAGNOSIS — Z992 Dependence on renal dialysis: Secondary | ICD-10-CM | POA: Diagnosis not present

## 2021-08-22 DIAGNOSIS — N2581 Secondary hyperparathyroidism of renal origin: Secondary | ICD-10-CM | POA: Diagnosis not present

## 2021-08-22 DIAGNOSIS — Z23 Encounter for immunization: Secondary | ICD-10-CM | POA: Diagnosis not present

## 2021-08-22 DIAGNOSIS — N186 End stage renal disease: Secondary | ICD-10-CM | POA: Diagnosis not present

## 2021-08-25 DIAGNOSIS — D509 Iron deficiency anemia, unspecified: Secondary | ICD-10-CM | POA: Diagnosis not present

## 2021-08-25 DIAGNOSIS — N2581 Secondary hyperparathyroidism of renal origin: Secondary | ICD-10-CM | POA: Diagnosis not present

## 2021-08-25 DIAGNOSIS — Z23 Encounter for immunization: Secondary | ICD-10-CM | POA: Diagnosis not present

## 2021-08-25 DIAGNOSIS — Z992 Dependence on renal dialysis: Secondary | ICD-10-CM | POA: Diagnosis not present

## 2021-08-25 DIAGNOSIS — N186 End stage renal disease: Secondary | ICD-10-CM | POA: Diagnosis not present

## 2021-08-27 DIAGNOSIS — D509 Iron deficiency anemia, unspecified: Secondary | ICD-10-CM | POA: Diagnosis not present

## 2021-08-27 DIAGNOSIS — Z23 Encounter for immunization: Secondary | ICD-10-CM | POA: Diagnosis not present

## 2021-08-27 DIAGNOSIS — Z992 Dependence on renal dialysis: Secondary | ICD-10-CM | POA: Diagnosis not present

## 2021-08-27 DIAGNOSIS — N2581 Secondary hyperparathyroidism of renal origin: Secondary | ICD-10-CM | POA: Diagnosis not present

## 2021-08-27 DIAGNOSIS — N186 End stage renal disease: Secondary | ICD-10-CM | POA: Diagnosis not present

## 2021-08-29 DIAGNOSIS — Z992 Dependence on renal dialysis: Secondary | ICD-10-CM | POA: Diagnosis not present

## 2021-08-29 DIAGNOSIS — N186 End stage renal disease: Secondary | ICD-10-CM | POA: Diagnosis not present

## 2021-08-29 DIAGNOSIS — D509 Iron deficiency anemia, unspecified: Secondary | ICD-10-CM | POA: Diagnosis not present

## 2021-08-29 DIAGNOSIS — Z23 Encounter for immunization: Secondary | ICD-10-CM | POA: Diagnosis not present

## 2021-08-29 DIAGNOSIS — N2581 Secondary hyperparathyroidism of renal origin: Secondary | ICD-10-CM | POA: Diagnosis not present

## 2021-09-01 DIAGNOSIS — N2581 Secondary hyperparathyroidism of renal origin: Secondary | ICD-10-CM | POA: Diagnosis not present

## 2021-09-01 DIAGNOSIS — D509 Iron deficiency anemia, unspecified: Secondary | ICD-10-CM | POA: Diagnosis not present

## 2021-09-01 DIAGNOSIS — N186 End stage renal disease: Secondary | ICD-10-CM | POA: Diagnosis not present

## 2021-09-01 DIAGNOSIS — Z23 Encounter for immunization: Secondary | ICD-10-CM | POA: Diagnosis not present

## 2021-09-01 DIAGNOSIS — Z992 Dependence on renal dialysis: Secondary | ICD-10-CM | POA: Diagnosis not present

## 2021-09-02 DIAGNOSIS — Z87891 Personal history of nicotine dependence: Secondary | ICD-10-CM | POA: Diagnosis not present

## 2021-09-02 DIAGNOSIS — Z905 Acquired absence of kidney: Secondary | ICD-10-CM | POA: Diagnosis not present

## 2021-09-02 DIAGNOSIS — C641 Malignant neoplasm of right kidney, except renal pelvis: Secondary | ICD-10-CM | POA: Diagnosis not present

## 2021-09-02 DIAGNOSIS — Z992 Dependence on renal dialysis: Secondary | ICD-10-CM | POA: Diagnosis not present

## 2021-09-02 DIAGNOSIS — R5383 Other fatigue: Secondary | ICD-10-CM | POA: Diagnosis not present

## 2021-09-02 DIAGNOSIS — I120 Hypertensive chronic kidney disease with stage 5 chronic kidney disease or end stage renal disease: Secondary | ICD-10-CM | POA: Diagnosis not present

## 2021-09-02 DIAGNOSIS — I82220 Acute embolism and thrombosis of inferior vena cava: Secondary | ICD-10-CM | POA: Diagnosis not present

## 2021-09-02 DIAGNOSIS — Z5112 Encounter for antineoplastic immunotherapy: Secondary | ICD-10-CM | POA: Diagnosis not present

## 2021-09-02 DIAGNOSIS — R600 Localized edema: Secondary | ICD-10-CM | POA: Diagnosis not present

## 2021-09-02 DIAGNOSIS — L309 Dermatitis, unspecified: Secondary | ICD-10-CM | POA: Diagnosis not present

## 2021-09-02 DIAGNOSIS — N186 End stage renal disease: Secondary | ICD-10-CM | POA: Diagnosis not present

## 2021-09-02 DIAGNOSIS — R918 Other nonspecific abnormal finding of lung field: Secondary | ICD-10-CM | POA: Diagnosis not present

## 2021-09-02 DIAGNOSIS — E1122 Type 2 diabetes mellitus with diabetic chronic kidney disease: Secondary | ICD-10-CM | POA: Diagnosis not present

## 2021-09-03 DIAGNOSIS — Z23 Encounter for immunization: Secondary | ICD-10-CM | POA: Diagnosis not present

## 2021-09-03 DIAGNOSIS — D509 Iron deficiency anemia, unspecified: Secondary | ICD-10-CM | POA: Diagnosis not present

## 2021-09-03 DIAGNOSIS — Z992 Dependence on renal dialysis: Secondary | ICD-10-CM | POA: Diagnosis not present

## 2021-09-03 DIAGNOSIS — N186 End stage renal disease: Secondary | ICD-10-CM | POA: Diagnosis not present

## 2021-09-03 DIAGNOSIS — N2581 Secondary hyperparathyroidism of renal origin: Secondary | ICD-10-CM | POA: Diagnosis not present

## 2021-09-04 ENCOUNTER — Other Ambulatory Visit: Payer: Self-pay | Admitting: Vascular Surgery

## 2021-09-04 DIAGNOSIS — N186 End stage renal disease: Secondary | ICD-10-CM | POA: Diagnosis not present

## 2021-09-04 DIAGNOSIS — Z992 Dependence on renal dialysis: Secondary | ICD-10-CM | POA: Diagnosis not present

## 2021-09-04 DIAGNOSIS — C649 Malignant neoplasm of unspecified kidney, except renal pelvis: Secondary | ICD-10-CM | POA: Diagnosis not present

## 2021-09-05 DIAGNOSIS — N2581 Secondary hyperparathyroidism of renal origin: Secondary | ICD-10-CM | POA: Diagnosis not present

## 2021-09-05 DIAGNOSIS — Z992 Dependence on renal dialysis: Secondary | ICD-10-CM | POA: Diagnosis not present

## 2021-09-05 DIAGNOSIS — Z23 Encounter for immunization: Secondary | ICD-10-CM | POA: Diagnosis not present

## 2021-09-05 DIAGNOSIS — N186 End stage renal disease: Secondary | ICD-10-CM | POA: Diagnosis not present

## 2021-09-05 DIAGNOSIS — D509 Iron deficiency anemia, unspecified: Secondary | ICD-10-CM | POA: Diagnosis not present

## 2021-09-05 DIAGNOSIS — D631 Anemia in chronic kidney disease: Secondary | ICD-10-CM | POA: Diagnosis not present

## 2021-09-08 DIAGNOSIS — D509 Iron deficiency anemia, unspecified: Secondary | ICD-10-CM | POA: Diagnosis not present

## 2021-09-08 DIAGNOSIS — D631 Anemia in chronic kidney disease: Secondary | ICD-10-CM | POA: Diagnosis not present

## 2021-09-08 DIAGNOSIS — N186 End stage renal disease: Secondary | ICD-10-CM | POA: Diagnosis not present

## 2021-09-08 DIAGNOSIS — N2581 Secondary hyperparathyroidism of renal origin: Secondary | ICD-10-CM | POA: Diagnosis not present

## 2021-09-08 DIAGNOSIS — Z23 Encounter for immunization: Secondary | ICD-10-CM | POA: Diagnosis not present

## 2021-09-08 DIAGNOSIS — Z992 Dependence on renal dialysis: Secondary | ICD-10-CM | POA: Diagnosis not present

## 2021-09-10 DIAGNOSIS — Z23 Encounter for immunization: Secondary | ICD-10-CM | POA: Diagnosis not present

## 2021-09-10 DIAGNOSIS — Z992 Dependence on renal dialysis: Secondary | ICD-10-CM | POA: Diagnosis not present

## 2021-09-10 DIAGNOSIS — N186 End stage renal disease: Secondary | ICD-10-CM | POA: Diagnosis not present

## 2021-09-10 DIAGNOSIS — N2581 Secondary hyperparathyroidism of renal origin: Secondary | ICD-10-CM | POA: Diagnosis not present

## 2021-09-10 DIAGNOSIS — D509 Iron deficiency anemia, unspecified: Secondary | ICD-10-CM | POA: Diagnosis not present

## 2021-09-10 DIAGNOSIS — D631 Anemia in chronic kidney disease: Secondary | ICD-10-CM | POA: Diagnosis not present

## 2021-09-15 DIAGNOSIS — D509 Iron deficiency anemia, unspecified: Secondary | ICD-10-CM | POA: Diagnosis not present

## 2021-09-15 DIAGNOSIS — Z23 Encounter for immunization: Secondary | ICD-10-CM | POA: Diagnosis not present

## 2021-09-15 DIAGNOSIS — D631 Anemia in chronic kidney disease: Secondary | ICD-10-CM | POA: Diagnosis not present

## 2021-09-15 DIAGNOSIS — Z992 Dependence on renal dialysis: Secondary | ICD-10-CM | POA: Diagnosis not present

## 2021-09-15 DIAGNOSIS — N2581 Secondary hyperparathyroidism of renal origin: Secondary | ICD-10-CM | POA: Diagnosis not present

## 2021-09-15 DIAGNOSIS — N186 End stage renal disease: Secondary | ICD-10-CM | POA: Diagnosis not present

## 2021-09-17 DIAGNOSIS — Z23 Encounter for immunization: Secondary | ICD-10-CM | POA: Diagnosis not present

## 2021-09-17 DIAGNOSIS — D631 Anemia in chronic kidney disease: Secondary | ICD-10-CM | POA: Diagnosis not present

## 2021-09-17 DIAGNOSIS — Z992 Dependence on renal dialysis: Secondary | ICD-10-CM | POA: Diagnosis not present

## 2021-09-17 DIAGNOSIS — N2581 Secondary hyperparathyroidism of renal origin: Secondary | ICD-10-CM | POA: Diagnosis not present

## 2021-09-17 DIAGNOSIS — D509 Iron deficiency anemia, unspecified: Secondary | ICD-10-CM | POA: Diagnosis not present

## 2021-09-17 DIAGNOSIS — N186 End stage renal disease: Secondary | ICD-10-CM | POA: Diagnosis not present

## 2021-09-19 DIAGNOSIS — N2581 Secondary hyperparathyroidism of renal origin: Secondary | ICD-10-CM | POA: Diagnosis not present

## 2021-09-19 DIAGNOSIS — Z992 Dependence on renal dialysis: Secondary | ICD-10-CM | POA: Diagnosis not present

## 2021-09-19 DIAGNOSIS — D509 Iron deficiency anemia, unspecified: Secondary | ICD-10-CM | POA: Diagnosis not present

## 2021-09-19 DIAGNOSIS — D631 Anemia in chronic kidney disease: Secondary | ICD-10-CM | POA: Diagnosis not present

## 2021-09-19 DIAGNOSIS — Z23 Encounter for immunization: Secondary | ICD-10-CM | POA: Diagnosis not present

## 2021-09-19 DIAGNOSIS — N186 End stage renal disease: Secondary | ICD-10-CM | POA: Diagnosis not present

## 2021-09-22 DIAGNOSIS — N186 End stage renal disease: Secondary | ICD-10-CM | POA: Diagnosis not present

## 2021-09-22 DIAGNOSIS — D631 Anemia in chronic kidney disease: Secondary | ICD-10-CM | POA: Diagnosis not present

## 2021-09-22 DIAGNOSIS — D509 Iron deficiency anemia, unspecified: Secondary | ICD-10-CM | POA: Diagnosis not present

## 2021-09-22 DIAGNOSIS — Z23 Encounter for immunization: Secondary | ICD-10-CM | POA: Diagnosis not present

## 2021-09-22 DIAGNOSIS — Z992 Dependence on renal dialysis: Secondary | ICD-10-CM | POA: Diagnosis not present

## 2021-09-22 DIAGNOSIS — N2581 Secondary hyperparathyroidism of renal origin: Secondary | ICD-10-CM | POA: Diagnosis not present

## 2021-09-24 DIAGNOSIS — D631 Anemia in chronic kidney disease: Secondary | ICD-10-CM | POA: Diagnosis not present

## 2021-09-24 DIAGNOSIS — Z23 Encounter for immunization: Secondary | ICD-10-CM | POA: Diagnosis not present

## 2021-09-24 DIAGNOSIS — D509 Iron deficiency anemia, unspecified: Secondary | ICD-10-CM | POA: Diagnosis not present

## 2021-09-24 DIAGNOSIS — N186 End stage renal disease: Secondary | ICD-10-CM | POA: Diagnosis not present

## 2021-09-24 DIAGNOSIS — N2581 Secondary hyperparathyroidism of renal origin: Secondary | ICD-10-CM | POA: Diagnosis not present

## 2021-09-24 DIAGNOSIS — Z992 Dependence on renal dialysis: Secondary | ICD-10-CM | POA: Diagnosis not present

## 2021-09-26 DIAGNOSIS — Z992 Dependence on renal dialysis: Secondary | ICD-10-CM | POA: Diagnosis not present

## 2021-09-26 DIAGNOSIS — Z23 Encounter for immunization: Secondary | ICD-10-CM | POA: Diagnosis not present

## 2021-09-26 DIAGNOSIS — N186 End stage renal disease: Secondary | ICD-10-CM | POA: Diagnosis not present

## 2021-09-26 DIAGNOSIS — D631 Anemia in chronic kidney disease: Secondary | ICD-10-CM | POA: Diagnosis not present

## 2021-09-26 DIAGNOSIS — D509 Iron deficiency anemia, unspecified: Secondary | ICD-10-CM | POA: Diagnosis not present

## 2021-09-26 DIAGNOSIS — N2581 Secondary hyperparathyroidism of renal origin: Secondary | ICD-10-CM | POA: Diagnosis not present

## 2021-09-29 DIAGNOSIS — D631 Anemia in chronic kidney disease: Secondary | ICD-10-CM | POA: Diagnosis not present

## 2021-09-29 DIAGNOSIS — D509 Iron deficiency anemia, unspecified: Secondary | ICD-10-CM | POA: Diagnosis not present

## 2021-09-29 DIAGNOSIS — N186 End stage renal disease: Secondary | ICD-10-CM | POA: Diagnosis not present

## 2021-09-29 DIAGNOSIS — Z992 Dependence on renal dialysis: Secondary | ICD-10-CM | POA: Diagnosis not present

## 2021-09-29 DIAGNOSIS — N2581 Secondary hyperparathyroidism of renal origin: Secondary | ICD-10-CM | POA: Diagnosis not present

## 2021-09-29 DIAGNOSIS — Z23 Encounter for immunization: Secondary | ICD-10-CM | POA: Diagnosis not present

## 2021-10-01 DIAGNOSIS — D509 Iron deficiency anemia, unspecified: Secondary | ICD-10-CM | POA: Diagnosis not present

## 2021-10-01 DIAGNOSIS — Z23 Encounter for immunization: Secondary | ICD-10-CM | POA: Diagnosis not present

## 2021-10-01 DIAGNOSIS — N2581 Secondary hyperparathyroidism of renal origin: Secondary | ICD-10-CM | POA: Diagnosis not present

## 2021-10-01 DIAGNOSIS — Z992 Dependence on renal dialysis: Secondary | ICD-10-CM | POA: Diagnosis not present

## 2021-10-01 DIAGNOSIS — N186 End stage renal disease: Secondary | ICD-10-CM | POA: Diagnosis not present

## 2021-10-01 DIAGNOSIS — D631 Anemia in chronic kidney disease: Secondary | ICD-10-CM | POA: Diagnosis not present

## 2021-10-03 DIAGNOSIS — D509 Iron deficiency anemia, unspecified: Secondary | ICD-10-CM | POA: Diagnosis not present

## 2021-10-03 DIAGNOSIS — N2581 Secondary hyperparathyroidism of renal origin: Secondary | ICD-10-CM | POA: Diagnosis not present

## 2021-10-03 DIAGNOSIS — Z23 Encounter for immunization: Secondary | ICD-10-CM | POA: Diagnosis not present

## 2021-10-03 DIAGNOSIS — Z992 Dependence on renal dialysis: Secondary | ICD-10-CM | POA: Diagnosis not present

## 2021-10-03 DIAGNOSIS — N186 End stage renal disease: Secondary | ICD-10-CM | POA: Diagnosis not present

## 2021-10-03 DIAGNOSIS — D631 Anemia in chronic kidney disease: Secondary | ICD-10-CM | POA: Diagnosis not present

## 2021-10-04 DIAGNOSIS — N186 End stage renal disease: Secondary | ICD-10-CM | POA: Diagnosis not present

## 2021-10-04 DIAGNOSIS — Z992 Dependence on renal dialysis: Secondary | ICD-10-CM | POA: Diagnosis not present

## 2021-10-04 DIAGNOSIS — C649 Malignant neoplasm of unspecified kidney, except renal pelvis: Secondary | ICD-10-CM | POA: Diagnosis not present

## 2021-10-06 DIAGNOSIS — Z23 Encounter for immunization: Secondary | ICD-10-CM | POA: Diagnosis not present

## 2021-10-06 DIAGNOSIS — D631 Anemia in chronic kidney disease: Secondary | ICD-10-CM | POA: Diagnosis not present

## 2021-10-06 DIAGNOSIS — N2581 Secondary hyperparathyroidism of renal origin: Secondary | ICD-10-CM | POA: Diagnosis not present

## 2021-10-06 DIAGNOSIS — N186 End stage renal disease: Secondary | ICD-10-CM | POA: Diagnosis not present

## 2021-10-06 DIAGNOSIS — D509 Iron deficiency anemia, unspecified: Secondary | ICD-10-CM | POA: Diagnosis not present

## 2021-10-06 DIAGNOSIS — Z992 Dependence on renal dialysis: Secondary | ICD-10-CM | POA: Diagnosis not present

## 2021-10-07 DIAGNOSIS — C641 Malignant neoplasm of right kidney, except renal pelvis: Secondary | ICD-10-CM | POA: Diagnosis not present

## 2021-10-07 DIAGNOSIS — I120 Hypertensive chronic kidney disease with stage 5 chronic kidney disease or end stage renal disease: Secondary | ICD-10-CM | POA: Diagnosis not present

## 2021-10-07 DIAGNOSIS — R918 Other nonspecific abnormal finding of lung field: Secondary | ICD-10-CM | POA: Diagnosis not present

## 2021-10-07 DIAGNOSIS — Z992 Dependence on renal dialysis: Secondary | ICD-10-CM | POA: Diagnosis not present

## 2021-10-07 DIAGNOSIS — N186 End stage renal disease: Secondary | ICD-10-CM | POA: Diagnosis not present

## 2021-10-07 DIAGNOSIS — Z5112 Encounter for antineoplastic immunotherapy: Secondary | ICD-10-CM | POA: Diagnosis not present

## 2021-10-07 DIAGNOSIS — Z905 Acquired absence of kidney: Secondary | ICD-10-CM | POA: Diagnosis not present

## 2021-10-07 DIAGNOSIS — R5383 Other fatigue: Secondary | ICD-10-CM | POA: Diagnosis not present

## 2021-10-07 DIAGNOSIS — Z87891 Personal history of nicotine dependence: Secondary | ICD-10-CM | POA: Diagnosis not present

## 2021-10-07 DIAGNOSIS — E1122 Type 2 diabetes mellitus with diabetic chronic kidney disease: Secondary | ICD-10-CM | POA: Diagnosis not present

## 2021-10-08 DIAGNOSIS — Z23 Encounter for immunization: Secondary | ICD-10-CM | POA: Diagnosis not present

## 2021-10-08 DIAGNOSIS — N2581 Secondary hyperparathyroidism of renal origin: Secondary | ICD-10-CM | POA: Diagnosis not present

## 2021-10-08 DIAGNOSIS — D509 Iron deficiency anemia, unspecified: Secondary | ICD-10-CM | POA: Diagnosis not present

## 2021-10-08 DIAGNOSIS — D631 Anemia in chronic kidney disease: Secondary | ICD-10-CM | POA: Diagnosis not present

## 2021-10-08 DIAGNOSIS — Z992 Dependence on renal dialysis: Secondary | ICD-10-CM | POA: Diagnosis not present

## 2021-10-08 DIAGNOSIS — N186 End stage renal disease: Secondary | ICD-10-CM | POA: Diagnosis not present

## 2021-10-10 DIAGNOSIS — Z23 Encounter for immunization: Secondary | ICD-10-CM | POA: Diagnosis not present

## 2021-10-10 DIAGNOSIS — N186 End stage renal disease: Secondary | ICD-10-CM | POA: Diagnosis not present

## 2021-10-10 DIAGNOSIS — Z992 Dependence on renal dialysis: Secondary | ICD-10-CM | POA: Diagnosis not present

## 2021-10-10 DIAGNOSIS — D631 Anemia in chronic kidney disease: Secondary | ICD-10-CM | POA: Diagnosis not present

## 2021-10-10 DIAGNOSIS — D509 Iron deficiency anemia, unspecified: Secondary | ICD-10-CM | POA: Diagnosis not present

## 2021-10-10 DIAGNOSIS — N2581 Secondary hyperparathyroidism of renal origin: Secondary | ICD-10-CM | POA: Diagnosis not present

## 2021-10-13 DIAGNOSIS — N2581 Secondary hyperparathyroidism of renal origin: Secondary | ICD-10-CM | POA: Diagnosis not present

## 2021-10-13 DIAGNOSIS — D509 Iron deficiency anemia, unspecified: Secondary | ICD-10-CM | POA: Diagnosis not present

## 2021-10-13 DIAGNOSIS — Z23 Encounter for immunization: Secondary | ICD-10-CM | POA: Diagnosis not present

## 2021-10-13 DIAGNOSIS — D631 Anemia in chronic kidney disease: Secondary | ICD-10-CM | POA: Diagnosis not present

## 2021-10-13 DIAGNOSIS — N186 End stage renal disease: Secondary | ICD-10-CM | POA: Diagnosis not present

## 2021-10-13 DIAGNOSIS — Z992 Dependence on renal dialysis: Secondary | ICD-10-CM | POA: Diagnosis not present

## 2021-10-15 DIAGNOSIS — Z992 Dependence on renal dialysis: Secondary | ICD-10-CM | POA: Diagnosis not present

## 2021-10-15 DIAGNOSIS — N186 End stage renal disease: Secondary | ICD-10-CM | POA: Diagnosis not present

## 2021-10-15 DIAGNOSIS — D631 Anemia in chronic kidney disease: Secondary | ICD-10-CM | POA: Diagnosis not present

## 2021-10-15 DIAGNOSIS — Z23 Encounter for immunization: Secondary | ICD-10-CM | POA: Diagnosis not present

## 2021-10-15 DIAGNOSIS — D509 Iron deficiency anemia, unspecified: Secondary | ICD-10-CM | POA: Diagnosis not present

## 2021-10-15 DIAGNOSIS — N2581 Secondary hyperparathyroidism of renal origin: Secondary | ICD-10-CM | POA: Diagnosis not present

## 2021-10-17 DIAGNOSIS — N186 End stage renal disease: Secondary | ICD-10-CM | POA: Diagnosis not present

## 2021-10-17 DIAGNOSIS — Z23 Encounter for immunization: Secondary | ICD-10-CM | POA: Diagnosis not present

## 2021-10-17 DIAGNOSIS — N2581 Secondary hyperparathyroidism of renal origin: Secondary | ICD-10-CM | POA: Diagnosis not present

## 2021-10-17 DIAGNOSIS — D509 Iron deficiency anemia, unspecified: Secondary | ICD-10-CM | POA: Diagnosis not present

## 2021-10-17 DIAGNOSIS — D631 Anemia in chronic kidney disease: Secondary | ICD-10-CM | POA: Diagnosis not present

## 2021-10-17 DIAGNOSIS — Z992 Dependence on renal dialysis: Secondary | ICD-10-CM | POA: Diagnosis not present

## 2021-10-20 DIAGNOSIS — D631 Anemia in chronic kidney disease: Secondary | ICD-10-CM | POA: Diagnosis not present

## 2021-10-20 DIAGNOSIS — Z992 Dependence on renal dialysis: Secondary | ICD-10-CM | POA: Diagnosis not present

## 2021-10-20 DIAGNOSIS — N186 End stage renal disease: Secondary | ICD-10-CM | POA: Diagnosis not present

## 2021-10-20 DIAGNOSIS — D509 Iron deficiency anemia, unspecified: Secondary | ICD-10-CM | POA: Diagnosis not present

## 2021-10-20 DIAGNOSIS — Z23 Encounter for immunization: Secondary | ICD-10-CM | POA: Diagnosis not present

## 2021-10-20 DIAGNOSIS — N2581 Secondary hyperparathyroidism of renal origin: Secondary | ICD-10-CM | POA: Diagnosis not present

## 2021-10-22 DIAGNOSIS — N2581 Secondary hyperparathyroidism of renal origin: Secondary | ICD-10-CM | POA: Diagnosis not present

## 2021-10-22 DIAGNOSIS — D631 Anemia in chronic kidney disease: Secondary | ICD-10-CM | POA: Diagnosis not present

## 2021-10-22 DIAGNOSIS — Z23 Encounter for immunization: Secondary | ICD-10-CM | POA: Diagnosis not present

## 2021-10-22 DIAGNOSIS — Z992 Dependence on renal dialysis: Secondary | ICD-10-CM | POA: Diagnosis not present

## 2021-10-22 DIAGNOSIS — N186 End stage renal disease: Secondary | ICD-10-CM | POA: Diagnosis not present

## 2021-10-22 DIAGNOSIS — D509 Iron deficiency anemia, unspecified: Secondary | ICD-10-CM | POA: Diagnosis not present

## 2021-10-24 DIAGNOSIS — N186 End stage renal disease: Secondary | ICD-10-CM | POA: Diagnosis not present

## 2021-10-24 DIAGNOSIS — Z23 Encounter for immunization: Secondary | ICD-10-CM | POA: Diagnosis not present

## 2021-10-24 DIAGNOSIS — D631 Anemia in chronic kidney disease: Secondary | ICD-10-CM | POA: Diagnosis not present

## 2021-10-24 DIAGNOSIS — Z992 Dependence on renal dialysis: Secondary | ICD-10-CM | POA: Diagnosis not present

## 2021-10-24 DIAGNOSIS — D509 Iron deficiency anemia, unspecified: Secondary | ICD-10-CM | POA: Diagnosis not present

## 2021-10-24 DIAGNOSIS — N2581 Secondary hyperparathyroidism of renal origin: Secondary | ICD-10-CM | POA: Diagnosis not present

## 2021-10-27 DIAGNOSIS — Z992 Dependence on renal dialysis: Secondary | ICD-10-CM | POA: Diagnosis not present

## 2021-10-27 DIAGNOSIS — D509 Iron deficiency anemia, unspecified: Secondary | ICD-10-CM | POA: Diagnosis not present

## 2021-10-27 DIAGNOSIS — N2581 Secondary hyperparathyroidism of renal origin: Secondary | ICD-10-CM | POA: Diagnosis not present

## 2021-10-27 DIAGNOSIS — N186 End stage renal disease: Secondary | ICD-10-CM | POA: Diagnosis not present

## 2021-10-27 DIAGNOSIS — D631 Anemia in chronic kidney disease: Secondary | ICD-10-CM | POA: Diagnosis not present

## 2021-10-27 DIAGNOSIS — Z23 Encounter for immunization: Secondary | ICD-10-CM | POA: Diagnosis not present

## 2021-10-29 DIAGNOSIS — Z992 Dependence on renal dialysis: Secondary | ICD-10-CM | POA: Diagnosis not present

## 2021-10-29 DIAGNOSIS — Z23 Encounter for immunization: Secondary | ICD-10-CM | POA: Diagnosis not present

## 2021-10-29 DIAGNOSIS — N2581 Secondary hyperparathyroidism of renal origin: Secondary | ICD-10-CM | POA: Diagnosis not present

## 2021-10-29 DIAGNOSIS — D631 Anemia in chronic kidney disease: Secondary | ICD-10-CM | POA: Diagnosis not present

## 2021-10-29 DIAGNOSIS — D509 Iron deficiency anemia, unspecified: Secondary | ICD-10-CM | POA: Diagnosis not present

## 2021-10-29 DIAGNOSIS — N186 End stage renal disease: Secondary | ICD-10-CM | POA: Diagnosis not present

## 2021-10-31 DIAGNOSIS — D509 Iron deficiency anemia, unspecified: Secondary | ICD-10-CM | POA: Diagnosis not present

## 2021-10-31 DIAGNOSIS — Z23 Encounter for immunization: Secondary | ICD-10-CM | POA: Diagnosis not present

## 2021-10-31 DIAGNOSIS — N2581 Secondary hyperparathyroidism of renal origin: Secondary | ICD-10-CM | POA: Diagnosis not present

## 2021-10-31 DIAGNOSIS — D631 Anemia in chronic kidney disease: Secondary | ICD-10-CM | POA: Diagnosis not present

## 2021-10-31 DIAGNOSIS — Z992 Dependence on renal dialysis: Secondary | ICD-10-CM | POA: Diagnosis not present

## 2021-10-31 DIAGNOSIS — N186 End stage renal disease: Secondary | ICD-10-CM | POA: Diagnosis not present

## 2021-11-03 DIAGNOSIS — Z23 Encounter for immunization: Secondary | ICD-10-CM | POA: Diagnosis not present

## 2021-11-03 DIAGNOSIS — N2581 Secondary hyperparathyroidism of renal origin: Secondary | ICD-10-CM | POA: Diagnosis not present

## 2021-11-03 DIAGNOSIS — Z992 Dependence on renal dialysis: Secondary | ICD-10-CM | POA: Diagnosis not present

## 2021-11-03 DIAGNOSIS — D631 Anemia in chronic kidney disease: Secondary | ICD-10-CM | POA: Diagnosis not present

## 2021-11-03 DIAGNOSIS — N186 End stage renal disease: Secondary | ICD-10-CM | POA: Diagnosis not present

## 2021-11-03 DIAGNOSIS — D509 Iron deficiency anemia, unspecified: Secondary | ICD-10-CM | POA: Diagnosis not present

## 2021-11-04 DIAGNOSIS — N186 End stage renal disease: Secondary | ICD-10-CM | POA: Diagnosis not present

## 2021-11-04 DIAGNOSIS — Z992 Dependence on renal dialysis: Secondary | ICD-10-CM | POA: Diagnosis not present

## 2021-11-04 DIAGNOSIS — C649 Malignant neoplasm of unspecified kidney, except renal pelvis: Secondary | ICD-10-CM | POA: Diagnosis not present

## 2021-11-05 DIAGNOSIS — D509 Iron deficiency anemia, unspecified: Secondary | ICD-10-CM | POA: Diagnosis not present

## 2021-11-05 DIAGNOSIS — Z23 Encounter for immunization: Secondary | ICD-10-CM | POA: Diagnosis not present

## 2021-11-05 DIAGNOSIS — Z992 Dependence on renal dialysis: Secondary | ICD-10-CM | POA: Diagnosis not present

## 2021-11-05 DIAGNOSIS — N186 End stage renal disease: Secondary | ICD-10-CM | POA: Diagnosis not present

## 2021-11-05 DIAGNOSIS — N2581 Secondary hyperparathyroidism of renal origin: Secondary | ICD-10-CM | POA: Diagnosis not present

## 2021-11-07 DIAGNOSIS — Z992 Dependence on renal dialysis: Secondary | ICD-10-CM | POA: Diagnosis not present

## 2021-11-07 DIAGNOSIS — Z23 Encounter for immunization: Secondary | ICD-10-CM | POA: Diagnosis not present

## 2021-11-07 DIAGNOSIS — N2581 Secondary hyperparathyroidism of renal origin: Secondary | ICD-10-CM | POA: Diagnosis not present

## 2021-11-07 DIAGNOSIS — D509 Iron deficiency anemia, unspecified: Secondary | ICD-10-CM | POA: Diagnosis not present

## 2021-11-07 DIAGNOSIS — N186 End stage renal disease: Secondary | ICD-10-CM | POA: Diagnosis not present

## 2021-11-10 DIAGNOSIS — Z992 Dependence on renal dialysis: Secondary | ICD-10-CM | POA: Diagnosis not present

## 2021-11-10 DIAGNOSIS — N2581 Secondary hyperparathyroidism of renal origin: Secondary | ICD-10-CM | POA: Diagnosis not present

## 2021-11-10 DIAGNOSIS — N186 End stage renal disease: Secondary | ICD-10-CM | POA: Diagnosis not present

## 2021-11-10 DIAGNOSIS — D509 Iron deficiency anemia, unspecified: Secondary | ICD-10-CM | POA: Diagnosis not present

## 2021-11-10 DIAGNOSIS — Z23 Encounter for immunization: Secondary | ICD-10-CM | POA: Diagnosis not present

## 2021-11-11 DIAGNOSIS — R21 Rash and other nonspecific skin eruption: Secondary | ICD-10-CM | POA: Diagnosis not present

## 2021-11-11 DIAGNOSIS — Z992 Dependence on renal dialysis: Secondary | ICD-10-CM | POA: Diagnosis not present

## 2021-11-11 DIAGNOSIS — Z5112 Encounter for antineoplastic immunotherapy: Secondary | ICD-10-CM | POA: Diagnosis not present

## 2021-11-11 DIAGNOSIS — Z87891 Personal history of nicotine dependence: Secondary | ICD-10-CM | POA: Diagnosis not present

## 2021-11-11 DIAGNOSIS — R600 Localized edema: Secondary | ICD-10-CM | POA: Diagnosis not present

## 2021-11-11 DIAGNOSIS — Z905 Acquired absence of kidney: Secondary | ICD-10-CM | POA: Diagnosis not present

## 2021-11-11 DIAGNOSIS — L309 Dermatitis, unspecified: Secondary | ICD-10-CM | POA: Diagnosis not present

## 2021-11-11 DIAGNOSIS — E1122 Type 2 diabetes mellitus with diabetic chronic kidney disease: Secondary | ICD-10-CM | POA: Diagnosis not present

## 2021-11-11 DIAGNOSIS — R5383 Other fatigue: Secondary | ICD-10-CM | POA: Diagnosis not present

## 2021-11-11 DIAGNOSIS — N184 Chronic kidney disease, stage 4 (severe): Secondary | ICD-10-CM | POA: Diagnosis not present

## 2021-11-11 DIAGNOSIS — C641 Malignant neoplasm of right kidney, except renal pelvis: Secondary | ICD-10-CM | POA: Diagnosis not present

## 2021-11-12 DIAGNOSIS — Z992 Dependence on renal dialysis: Secondary | ICD-10-CM | POA: Diagnosis not present

## 2021-11-12 DIAGNOSIS — N186 End stage renal disease: Secondary | ICD-10-CM | POA: Diagnosis not present

## 2021-11-12 DIAGNOSIS — Z23 Encounter for immunization: Secondary | ICD-10-CM | POA: Diagnosis not present

## 2021-11-12 DIAGNOSIS — D509 Iron deficiency anemia, unspecified: Secondary | ICD-10-CM | POA: Diagnosis not present

## 2021-11-12 DIAGNOSIS — N2581 Secondary hyperparathyroidism of renal origin: Secondary | ICD-10-CM | POA: Diagnosis not present

## 2021-11-14 DIAGNOSIS — N186 End stage renal disease: Secondary | ICD-10-CM | POA: Diagnosis not present

## 2021-11-14 DIAGNOSIS — Z992 Dependence on renal dialysis: Secondary | ICD-10-CM | POA: Diagnosis not present

## 2021-11-14 DIAGNOSIS — N2581 Secondary hyperparathyroidism of renal origin: Secondary | ICD-10-CM | POA: Diagnosis not present

## 2021-11-14 DIAGNOSIS — D509 Iron deficiency anemia, unspecified: Secondary | ICD-10-CM | POA: Diagnosis not present

## 2021-11-14 DIAGNOSIS — Z23 Encounter for immunization: Secondary | ICD-10-CM | POA: Diagnosis not present

## 2021-11-17 DIAGNOSIS — D509 Iron deficiency anemia, unspecified: Secondary | ICD-10-CM | POA: Diagnosis not present

## 2021-11-17 DIAGNOSIS — N186 End stage renal disease: Secondary | ICD-10-CM | POA: Diagnosis not present

## 2021-11-17 DIAGNOSIS — N2581 Secondary hyperparathyroidism of renal origin: Secondary | ICD-10-CM | POA: Diagnosis not present

## 2021-11-17 DIAGNOSIS — Z23 Encounter for immunization: Secondary | ICD-10-CM | POA: Diagnosis not present

## 2021-11-17 DIAGNOSIS — Z992 Dependence on renal dialysis: Secondary | ICD-10-CM | POA: Diagnosis not present

## 2021-11-19 DIAGNOSIS — Z23 Encounter for immunization: Secondary | ICD-10-CM | POA: Diagnosis not present

## 2021-11-19 DIAGNOSIS — D509 Iron deficiency anemia, unspecified: Secondary | ICD-10-CM | POA: Diagnosis not present

## 2021-11-19 DIAGNOSIS — N186 End stage renal disease: Secondary | ICD-10-CM | POA: Diagnosis not present

## 2021-11-19 DIAGNOSIS — N2581 Secondary hyperparathyroidism of renal origin: Secondary | ICD-10-CM | POA: Diagnosis not present

## 2021-11-19 DIAGNOSIS — Z992 Dependence on renal dialysis: Secondary | ICD-10-CM | POA: Diagnosis not present

## 2021-11-21 DIAGNOSIS — N2581 Secondary hyperparathyroidism of renal origin: Secondary | ICD-10-CM | POA: Diagnosis not present

## 2021-11-21 DIAGNOSIS — Z23 Encounter for immunization: Secondary | ICD-10-CM | POA: Diagnosis not present

## 2021-11-21 DIAGNOSIS — D509 Iron deficiency anemia, unspecified: Secondary | ICD-10-CM | POA: Diagnosis not present

## 2021-11-21 DIAGNOSIS — Z992 Dependence on renal dialysis: Secondary | ICD-10-CM | POA: Diagnosis not present

## 2021-11-21 DIAGNOSIS — N186 End stage renal disease: Secondary | ICD-10-CM | POA: Diagnosis not present

## 2021-11-23 DIAGNOSIS — Z992 Dependence on renal dialysis: Secondary | ICD-10-CM | POA: Diagnosis not present

## 2021-11-23 DIAGNOSIS — N186 End stage renal disease: Secondary | ICD-10-CM | POA: Diagnosis not present

## 2021-11-23 DIAGNOSIS — D509 Iron deficiency anemia, unspecified: Secondary | ICD-10-CM | POA: Diagnosis not present

## 2021-11-23 DIAGNOSIS — Z23 Encounter for immunization: Secondary | ICD-10-CM | POA: Diagnosis not present

## 2021-11-23 DIAGNOSIS — N2581 Secondary hyperparathyroidism of renal origin: Secondary | ICD-10-CM | POA: Diagnosis not present

## 2021-11-25 DIAGNOSIS — Z23 Encounter for immunization: Secondary | ICD-10-CM | POA: Diagnosis not present

## 2021-11-25 DIAGNOSIS — Z992 Dependence on renal dialysis: Secondary | ICD-10-CM | POA: Diagnosis not present

## 2021-11-25 DIAGNOSIS — D509 Iron deficiency anemia, unspecified: Secondary | ICD-10-CM | POA: Diagnosis not present

## 2021-11-25 DIAGNOSIS — N186 End stage renal disease: Secondary | ICD-10-CM | POA: Diagnosis not present

## 2021-11-25 DIAGNOSIS — N2581 Secondary hyperparathyroidism of renal origin: Secondary | ICD-10-CM | POA: Diagnosis not present

## 2021-11-28 DIAGNOSIS — Z992 Dependence on renal dialysis: Secondary | ICD-10-CM | POA: Diagnosis not present

## 2021-11-28 DIAGNOSIS — Z23 Encounter for immunization: Secondary | ICD-10-CM | POA: Diagnosis not present

## 2021-11-28 DIAGNOSIS — N2581 Secondary hyperparathyroidism of renal origin: Secondary | ICD-10-CM | POA: Diagnosis not present

## 2021-11-28 DIAGNOSIS — D509 Iron deficiency anemia, unspecified: Secondary | ICD-10-CM | POA: Diagnosis not present

## 2021-11-28 DIAGNOSIS — N186 End stage renal disease: Secondary | ICD-10-CM | POA: Diagnosis not present

## 2021-12-01 DIAGNOSIS — D509 Iron deficiency anemia, unspecified: Secondary | ICD-10-CM | POA: Diagnosis not present

## 2021-12-01 DIAGNOSIS — N186 End stage renal disease: Secondary | ICD-10-CM | POA: Diagnosis not present

## 2021-12-01 DIAGNOSIS — Z23 Encounter for immunization: Secondary | ICD-10-CM | POA: Diagnosis not present

## 2021-12-01 DIAGNOSIS — N2581 Secondary hyperparathyroidism of renal origin: Secondary | ICD-10-CM | POA: Diagnosis not present

## 2021-12-01 DIAGNOSIS — Z992 Dependence on renal dialysis: Secondary | ICD-10-CM | POA: Diagnosis not present

## 2021-12-03 DIAGNOSIS — N2581 Secondary hyperparathyroidism of renal origin: Secondary | ICD-10-CM | POA: Diagnosis not present

## 2021-12-03 DIAGNOSIS — Z23 Encounter for immunization: Secondary | ICD-10-CM | POA: Diagnosis not present

## 2021-12-03 DIAGNOSIS — N186 End stage renal disease: Secondary | ICD-10-CM | POA: Diagnosis not present

## 2021-12-03 DIAGNOSIS — D509 Iron deficiency anemia, unspecified: Secondary | ICD-10-CM | POA: Diagnosis not present

## 2021-12-03 DIAGNOSIS — Z992 Dependence on renal dialysis: Secondary | ICD-10-CM | POA: Diagnosis not present

## 2021-12-04 DIAGNOSIS — C649 Malignant neoplasm of unspecified kidney, except renal pelvis: Secondary | ICD-10-CM | POA: Diagnosis not present

## 2021-12-04 DIAGNOSIS — N186 End stage renal disease: Secondary | ICD-10-CM | POA: Diagnosis not present

## 2021-12-04 DIAGNOSIS — Z992 Dependence on renal dialysis: Secondary | ICD-10-CM | POA: Diagnosis not present

## 2021-12-05 DIAGNOSIS — N186 End stage renal disease: Secondary | ICD-10-CM | POA: Diagnosis not present

## 2021-12-05 DIAGNOSIS — Z23 Encounter for immunization: Secondary | ICD-10-CM | POA: Diagnosis not present

## 2021-12-05 DIAGNOSIS — Z992 Dependence on renal dialysis: Secondary | ICD-10-CM | POA: Diagnosis not present

## 2021-12-05 DIAGNOSIS — D509 Iron deficiency anemia, unspecified: Secondary | ICD-10-CM | POA: Diagnosis not present

## 2021-12-05 DIAGNOSIS — N2581 Secondary hyperparathyroidism of renal origin: Secondary | ICD-10-CM | POA: Diagnosis not present

## 2021-12-08 DIAGNOSIS — D509 Iron deficiency anemia, unspecified: Secondary | ICD-10-CM | POA: Diagnosis not present

## 2021-12-08 DIAGNOSIS — N2581 Secondary hyperparathyroidism of renal origin: Secondary | ICD-10-CM | POA: Diagnosis not present

## 2021-12-08 DIAGNOSIS — N186 End stage renal disease: Secondary | ICD-10-CM | POA: Diagnosis not present

## 2021-12-08 DIAGNOSIS — Z23 Encounter for immunization: Secondary | ICD-10-CM | POA: Diagnosis not present

## 2021-12-08 DIAGNOSIS — Z992 Dependence on renal dialysis: Secondary | ICD-10-CM | POA: Diagnosis not present

## 2021-12-10 DIAGNOSIS — Z992 Dependence on renal dialysis: Secondary | ICD-10-CM | POA: Diagnosis not present

## 2021-12-10 DIAGNOSIS — N2581 Secondary hyperparathyroidism of renal origin: Secondary | ICD-10-CM | POA: Diagnosis not present

## 2021-12-10 DIAGNOSIS — Z23 Encounter for immunization: Secondary | ICD-10-CM | POA: Diagnosis not present

## 2021-12-10 DIAGNOSIS — N186 End stage renal disease: Secondary | ICD-10-CM | POA: Diagnosis not present

## 2021-12-10 DIAGNOSIS — D509 Iron deficiency anemia, unspecified: Secondary | ICD-10-CM | POA: Diagnosis not present

## 2021-12-12 DIAGNOSIS — N2581 Secondary hyperparathyroidism of renal origin: Secondary | ICD-10-CM | POA: Diagnosis not present

## 2021-12-12 DIAGNOSIS — Z23 Encounter for immunization: Secondary | ICD-10-CM | POA: Diagnosis not present

## 2021-12-12 DIAGNOSIS — N186 End stage renal disease: Secondary | ICD-10-CM | POA: Diagnosis not present

## 2021-12-12 DIAGNOSIS — D509 Iron deficiency anemia, unspecified: Secondary | ICD-10-CM | POA: Diagnosis not present

## 2021-12-12 DIAGNOSIS — Z992 Dependence on renal dialysis: Secondary | ICD-10-CM | POA: Diagnosis not present

## 2021-12-15 DIAGNOSIS — Z23 Encounter for immunization: Secondary | ICD-10-CM | POA: Diagnosis not present

## 2021-12-15 DIAGNOSIS — D509 Iron deficiency anemia, unspecified: Secondary | ICD-10-CM | POA: Diagnosis not present

## 2021-12-15 DIAGNOSIS — N2581 Secondary hyperparathyroidism of renal origin: Secondary | ICD-10-CM | POA: Diagnosis not present

## 2021-12-15 DIAGNOSIS — Z992 Dependence on renal dialysis: Secondary | ICD-10-CM | POA: Diagnosis not present

## 2021-12-15 DIAGNOSIS — N186 End stage renal disease: Secondary | ICD-10-CM | POA: Diagnosis not present

## 2021-12-16 DIAGNOSIS — N19 Unspecified kidney failure: Secondary | ICD-10-CM | POA: Diagnosis not present

## 2021-12-16 DIAGNOSIS — Z87891 Personal history of nicotine dependence: Secondary | ICD-10-CM | POA: Diagnosis not present

## 2021-12-16 DIAGNOSIS — I10 Essential (primary) hypertension: Secondary | ICD-10-CM | POA: Diagnosis not present

## 2021-12-16 DIAGNOSIS — Z5112 Encounter for antineoplastic immunotherapy: Secondary | ICD-10-CM | POA: Diagnosis not present

## 2021-12-16 DIAGNOSIS — C641 Malignant neoplasm of right kidney, except renal pelvis: Secondary | ICD-10-CM | POA: Diagnosis not present

## 2021-12-16 DIAGNOSIS — E119 Type 2 diabetes mellitus without complications: Secondary | ICD-10-CM | POA: Diagnosis not present

## 2021-12-17 DIAGNOSIS — N186 End stage renal disease: Secondary | ICD-10-CM | POA: Diagnosis not present

## 2021-12-17 DIAGNOSIS — N2581 Secondary hyperparathyroidism of renal origin: Secondary | ICD-10-CM | POA: Diagnosis not present

## 2021-12-17 DIAGNOSIS — D509 Iron deficiency anemia, unspecified: Secondary | ICD-10-CM | POA: Diagnosis not present

## 2021-12-17 DIAGNOSIS — Z992 Dependence on renal dialysis: Secondary | ICD-10-CM | POA: Diagnosis not present

## 2021-12-17 DIAGNOSIS — Z23 Encounter for immunization: Secondary | ICD-10-CM | POA: Diagnosis not present

## 2021-12-19 DIAGNOSIS — N186 End stage renal disease: Secondary | ICD-10-CM | POA: Diagnosis not present

## 2021-12-19 DIAGNOSIS — Z992 Dependence on renal dialysis: Secondary | ICD-10-CM | POA: Diagnosis not present

## 2021-12-19 DIAGNOSIS — Z23 Encounter for immunization: Secondary | ICD-10-CM | POA: Diagnosis not present

## 2021-12-19 DIAGNOSIS — D509 Iron deficiency anemia, unspecified: Secondary | ICD-10-CM | POA: Diagnosis not present

## 2021-12-19 DIAGNOSIS — N2581 Secondary hyperparathyroidism of renal origin: Secondary | ICD-10-CM | POA: Diagnosis not present

## 2021-12-22 DIAGNOSIS — D509 Iron deficiency anemia, unspecified: Secondary | ICD-10-CM | POA: Diagnosis not present

## 2021-12-22 DIAGNOSIS — Z992 Dependence on renal dialysis: Secondary | ICD-10-CM | POA: Diagnosis not present

## 2021-12-22 DIAGNOSIS — Z23 Encounter for immunization: Secondary | ICD-10-CM | POA: Diagnosis not present

## 2021-12-22 DIAGNOSIS — N186 End stage renal disease: Secondary | ICD-10-CM | POA: Diagnosis not present

## 2021-12-22 DIAGNOSIS — N2581 Secondary hyperparathyroidism of renal origin: Secondary | ICD-10-CM | POA: Diagnosis not present

## 2021-12-24 DIAGNOSIS — D509 Iron deficiency anemia, unspecified: Secondary | ICD-10-CM | POA: Diagnosis not present

## 2021-12-24 DIAGNOSIS — N2581 Secondary hyperparathyroidism of renal origin: Secondary | ICD-10-CM | POA: Diagnosis not present

## 2021-12-24 DIAGNOSIS — N186 End stage renal disease: Secondary | ICD-10-CM | POA: Diagnosis not present

## 2021-12-24 DIAGNOSIS — Z23 Encounter for immunization: Secondary | ICD-10-CM | POA: Diagnosis not present

## 2021-12-24 DIAGNOSIS — Z992 Dependence on renal dialysis: Secondary | ICD-10-CM | POA: Diagnosis not present

## 2021-12-26 DIAGNOSIS — N2581 Secondary hyperparathyroidism of renal origin: Secondary | ICD-10-CM | POA: Diagnosis not present

## 2021-12-26 DIAGNOSIS — Z992 Dependence on renal dialysis: Secondary | ICD-10-CM | POA: Diagnosis not present

## 2021-12-26 DIAGNOSIS — N186 End stage renal disease: Secondary | ICD-10-CM | POA: Diagnosis not present

## 2021-12-26 DIAGNOSIS — D509 Iron deficiency anemia, unspecified: Secondary | ICD-10-CM | POA: Diagnosis not present

## 2021-12-26 DIAGNOSIS — Z23 Encounter for immunization: Secondary | ICD-10-CM | POA: Diagnosis not present

## 2021-12-29 DIAGNOSIS — N2581 Secondary hyperparathyroidism of renal origin: Secondary | ICD-10-CM | POA: Diagnosis not present

## 2021-12-29 DIAGNOSIS — Z992 Dependence on renal dialysis: Secondary | ICD-10-CM | POA: Diagnosis not present

## 2021-12-29 DIAGNOSIS — N186 End stage renal disease: Secondary | ICD-10-CM | POA: Diagnosis not present

## 2021-12-29 DIAGNOSIS — D509 Iron deficiency anemia, unspecified: Secondary | ICD-10-CM | POA: Diagnosis not present

## 2021-12-29 DIAGNOSIS — Z23 Encounter for immunization: Secondary | ICD-10-CM | POA: Diagnosis not present

## 2021-12-31 DIAGNOSIS — Z992 Dependence on renal dialysis: Secondary | ICD-10-CM | POA: Diagnosis not present

## 2021-12-31 DIAGNOSIS — Z23 Encounter for immunization: Secondary | ICD-10-CM | POA: Diagnosis not present

## 2021-12-31 DIAGNOSIS — D509 Iron deficiency anemia, unspecified: Secondary | ICD-10-CM | POA: Diagnosis not present

## 2021-12-31 DIAGNOSIS — N2581 Secondary hyperparathyroidism of renal origin: Secondary | ICD-10-CM | POA: Diagnosis not present

## 2021-12-31 DIAGNOSIS — N186 End stage renal disease: Secondary | ICD-10-CM | POA: Diagnosis not present

## 2022-01-02 DIAGNOSIS — Z992 Dependence on renal dialysis: Secondary | ICD-10-CM | POA: Diagnosis not present

## 2022-01-02 DIAGNOSIS — D509 Iron deficiency anemia, unspecified: Secondary | ICD-10-CM | POA: Diagnosis not present

## 2022-01-02 DIAGNOSIS — Z23 Encounter for immunization: Secondary | ICD-10-CM | POA: Diagnosis not present

## 2022-01-02 DIAGNOSIS — N2581 Secondary hyperparathyroidism of renal origin: Secondary | ICD-10-CM | POA: Diagnosis not present

## 2022-01-02 DIAGNOSIS — N186 End stage renal disease: Secondary | ICD-10-CM | POA: Diagnosis not present

## 2022-01-04 DIAGNOSIS — Z992 Dependence on renal dialysis: Secondary | ICD-10-CM | POA: Diagnosis not present

## 2022-01-04 DIAGNOSIS — N186 End stage renal disease: Secondary | ICD-10-CM | POA: Diagnosis not present

## 2022-01-04 DIAGNOSIS — C649 Malignant neoplasm of unspecified kidney, except renal pelvis: Secondary | ICD-10-CM | POA: Diagnosis not present

## 2022-01-05 DIAGNOSIS — N2581 Secondary hyperparathyroidism of renal origin: Secondary | ICD-10-CM | POA: Diagnosis not present

## 2022-01-05 DIAGNOSIS — D631 Anemia in chronic kidney disease: Secondary | ICD-10-CM | POA: Diagnosis not present

## 2022-01-05 DIAGNOSIS — Z992 Dependence on renal dialysis: Secondary | ICD-10-CM | POA: Diagnosis not present

## 2022-01-05 DIAGNOSIS — D509 Iron deficiency anemia, unspecified: Secondary | ICD-10-CM | POA: Diagnosis not present

## 2022-01-05 DIAGNOSIS — N186 End stage renal disease: Secondary | ICD-10-CM | POA: Diagnosis not present

## 2022-01-07 DIAGNOSIS — N186 End stage renal disease: Secondary | ICD-10-CM | POA: Diagnosis not present

## 2022-01-07 DIAGNOSIS — D509 Iron deficiency anemia, unspecified: Secondary | ICD-10-CM | POA: Diagnosis not present

## 2022-01-07 DIAGNOSIS — D631 Anemia in chronic kidney disease: Secondary | ICD-10-CM | POA: Diagnosis not present

## 2022-01-07 DIAGNOSIS — Z992 Dependence on renal dialysis: Secondary | ICD-10-CM | POA: Diagnosis not present

## 2022-01-07 DIAGNOSIS — N2581 Secondary hyperparathyroidism of renal origin: Secondary | ICD-10-CM | POA: Diagnosis not present

## 2022-01-09 DIAGNOSIS — N2581 Secondary hyperparathyroidism of renal origin: Secondary | ICD-10-CM | POA: Diagnosis not present

## 2022-01-09 DIAGNOSIS — Z992 Dependence on renal dialysis: Secondary | ICD-10-CM | POA: Diagnosis not present

## 2022-01-09 DIAGNOSIS — N186 End stage renal disease: Secondary | ICD-10-CM | POA: Diagnosis not present

## 2022-01-09 DIAGNOSIS — D631 Anemia in chronic kidney disease: Secondary | ICD-10-CM | POA: Diagnosis not present

## 2022-01-09 DIAGNOSIS — D509 Iron deficiency anemia, unspecified: Secondary | ICD-10-CM | POA: Diagnosis not present

## 2022-01-12 DIAGNOSIS — D631 Anemia in chronic kidney disease: Secondary | ICD-10-CM | POA: Diagnosis not present

## 2022-01-12 DIAGNOSIS — N2581 Secondary hyperparathyroidism of renal origin: Secondary | ICD-10-CM | POA: Diagnosis not present

## 2022-01-12 DIAGNOSIS — D509 Iron deficiency anemia, unspecified: Secondary | ICD-10-CM | POA: Diagnosis not present

## 2022-01-12 DIAGNOSIS — Z992 Dependence on renal dialysis: Secondary | ICD-10-CM | POA: Diagnosis not present

## 2022-01-12 DIAGNOSIS — N186 End stage renal disease: Secondary | ICD-10-CM | POA: Diagnosis not present

## 2022-01-13 DIAGNOSIS — Z87891 Personal history of nicotine dependence: Secondary | ICD-10-CM | POA: Diagnosis not present

## 2022-01-13 DIAGNOSIS — R918 Other nonspecific abnormal finding of lung field: Secondary | ICD-10-CM | POA: Diagnosis not present

## 2022-01-13 DIAGNOSIS — Z79899 Other long term (current) drug therapy: Secondary | ICD-10-CM | POA: Diagnosis not present

## 2022-01-13 DIAGNOSIS — I120 Hypertensive chronic kidney disease with stage 5 chronic kidney disease or end stage renal disease: Secondary | ICD-10-CM | POA: Diagnosis not present

## 2022-01-13 DIAGNOSIS — Z5112 Encounter for antineoplastic immunotherapy: Secondary | ICD-10-CM | POA: Diagnosis not present

## 2022-01-13 DIAGNOSIS — Z992 Dependence on renal dialysis: Secondary | ICD-10-CM | POA: Diagnosis not present

## 2022-01-13 DIAGNOSIS — N186 End stage renal disease: Secondary | ICD-10-CM | POA: Diagnosis not present

## 2022-01-13 DIAGNOSIS — E1122 Type 2 diabetes mellitus with diabetic chronic kidney disease: Secondary | ICD-10-CM | POA: Diagnosis not present

## 2022-01-13 DIAGNOSIS — C641 Malignant neoplasm of right kidney, except renal pelvis: Secondary | ICD-10-CM | POA: Diagnosis not present

## 2022-01-14 DIAGNOSIS — N186 End stage renal disease: Secondary | ICD-10-CM | POA: Diagnosis not present

## 2022-01-14 DIAGNOSIS — D509 Iron deficiency anemia, unspecified: Secondary | ICD-10-CM | POA: Diagnosis not present

## 2022-01-14 DIAGNOSIS — Z992 Dependence on renal dialysis: Secondary | ICD-10-CM | POA: Diagnosis not present

## 2022-01-14 DIAGNOSIS — N2581 Secondary hyperparathyroidism of renal origin: Secondary | ICD-10-CM | POA: Diagnosis not present

## 2022-01-14 DIAGNOSIS — D631 Anemia in chronic kidney disease: Secondary | ICD-10-CM | POA: Diagnosis not present

## 2022-01-16 DIAGNOSIS — N2581 Secondary hyperparathyroidism of renal origin: Secondary | ICD-10-CM | POA: Diagnosis not present

## 2022-01-16 DIAGNOSIS — D631 Anemia in chronic kidney disease: Secondary | ICD-10-CM | POA: Diagnosis not present

## 2022-01-16 DIAGNOSIS — N186 End stage renal disease: Secondary | ICD-10-CM | POA: Diagnosis not present

## 2022-01-16 DIAGNOSIS — Z992 Dependence on renal dialysis: Secondary | ICD-10-CM | POA: Diagnosis not present

## 2022-01-16 DIAGNOSIS — D509 Iron deficiency anemia, unspecified: Secondary | ICD-10-CM | POA: Diagnosis not present

## 2022-01-19 DIAGNOSIS — Z992 Dependence on renal dialysis: Secondary | ICD-10-CM | POA: Diagnosis not present

## 2022-01-19 DIAGNOSIS — D509 Iron deficiency anemia, unspecified: Secondary | ICD-10-CM | POA: Diagnosis not present

## 2022-01-19 DIAGNOSIS — N186 End stage renal disease: Secondary | ICD-10-CM | POA: Diagnosis not present

## 2022-01-19 DIAGNOSIS — N2581 Secondary hyperparathyroidism of renal origin: Secondary | ICD-10-CM | POA: Diagnosis not present

## 2022-01-19 DIAGNOSIS — D631 Anemia in chronic kidney disease: Secondary | ICD-10-CM | POA: Diagnosis not present

## 2022-01-21 DIAGNOSIS — N2581 Secondary hyperparathyroidism of renal origin: Secondary | ICD-10-CM | POA: Diagnosis not present

## 2022-01-21 DIAGNOSIS — Z992 Dependence on renal dialysis: Secondary | ICD-10-CM | POA: Diagnosis not present

## 2022-01-21 DIAGNOSIS — N186 End stage renal disease: Secondary | ICD-10-CM | POA: Diagnosis not present

## 2022-01-21 DIAGNOSIS — D509 Iron deficiency anemia, unspecified: Secondary | ICD-10-CM | POA: Diagnosis not present

## 2022-01-21 DIAGNOSIS — D631 Anemia in chronic kidney disease: Secondary | ICD-10-CM | POA: Diagnosis not present

## 2022-01-25 DIAGNOSIS — Z1152 Encounter for screening for COVID-19: Secondary | ICD-10-CM | POA: Diagnosis not present

## 2022-01-25 DIAGNOSIS — R509 Fever, unspecified: Secondary | ICD-10-CM | POA: Diagnosis not present

## 2022-01-25 DIAGNOSIS — R269 Unspecified abnormalities of gait and mobility: Secondary | ICD-10-CM | POA: Diagnosis not present

## 2022-01-25 DIAGNOSIS — R109 Unspecified abdominal pain: Secondary | ICD-10-CM | POA: Diagnosis not present

## 2022-01-25 DIAGNOSIS — J189 Pneumonia, unspecified organism: Secondary | ICD-10-CM | POA: Diagnosis not present

## 2022-01-25 DIAGNOSIS — R110 Nausea: Secondary | ICD-10-CM | POA: Diagnosis not present

## 2022-01-26 DIAGNOSIS — N186 End stage renal disease: Secondary | ICD-10-CM | POA: Diagnosis not present

## 2022-01-26 DIAGNOSIS — N2581 Secondary hyperparathyroidism of renal origin: Secondary | ICD-10-CM | POA: Diagnosis not present

## 2022-01-26 DIAGNOSIS — D509 Iron deficiency anemia, unspecified: Secondary | ICD-10-CM | POA: Diagnosis not present

## 2022-01-26 DIAGNOSIS — Z992 Dependence on renal dialysis: Secondary | ICD-10-CM | POA: Diagnosis not present

## 2022-01-26 DIAGNOSIS — D631 Anemia in chronic kidney disease: Secondary | ICD-10-CM | POA: Diagnosis not present

## 2022-01-28 DIAGNOSIS — N2581 Secondary hyperparathyroidism of renal origin: Secondary | ICD-10-CM | POA: Diagnosis not present

## 2022-01-28 DIAGNOSIS — D631 Anemia in chronic kidney disease: Secondary | ICD-10-CM | POA: Diagnosis not present

## 2022-01-28 DIAGNOSIS — N186 End stage renal disease: Secondary | ICD-10-CM | POA: Diagnosis not present

## 2022-01-28 DIAGNOSIS — Z992 Dependence on renal dialysis: Secondary | ICD-10-CM | POA: Diagnosis not present

## 2022-01-28 DIAGNOSIS — D509 Iron deficiency anemia, unspecified: Secondary | ICD-10-CM | POA: Diagnosis not present

## 2022-01-30 DIAGNOSIS — Z992 Dependence on renal dialysis: Secondary | ICD-10-CM | POA: Diagnosis not present

## 2022-01-30 DIAGNOSIS — D631 Anemia in chronic kidney disease: Secondary | ICD-10-CM | POA: Diagnosis not present

## 2022-01-30 DIAGNOSIS — N2581 Secondary hyperparathyroidism of renal origin: Secondary | ICD-10-CM | POA: Diagnosis not present

## 2022-01-30 DIAGNOSIS — D509 Iron deficiency anemia, unspecified: Secondary | ICD-10-CM | POA: Diagnosis not present

## 2022-01-30 DIAGNOSIS — N186 End stage renal disease: Secondary | ICD-10-CM | POA: Diagnosis not present

## 2022-02-02 DIAGNOSIS — N186 End stage renal disease: Secondary | ICD-10-CM | POA: Diagnosis not present

## 2022-02-02 DIAGNOSIS — D631 Anemia in chronic kidney disease: Secondary | ICD-10-CM | POA: Diagnosis not present

## 2022-02-02 DIAGNOSIS — D509 Iron deficiency anemia, unspecified: Secondary | ICD-10-CM | POA: Diagnosis not present

## 2022-02-02 DIAGNOSIS — N2581 Secondary hyperparathyroidism of renal origin: Secondary | ICD-10-CM | POA: Diagnosis not present

## 2022-02-02 DIAGNOSIS — Z992 Dependence on renal dialysis: Secondary | ICD-10-CM | POA: Diagnosis not present

## 2022-02-04 DIAGNOSIS — C649 Malignant neoplasm of unspecified kidney, except renal pelvis: Secondary | ICD-10-CM | POA: Diagnosis not present

## 2022-02-04 DIAGNOSIS — D509 Iron deficiency anemia, unspecified: Secondary | ICD-10-CM | POA: Diagnosis not present

## 2022-02-04 DIAGNOSIS — Z992 Dependence on renal dialysis: Secondary | ICD-10-CM | POA: Diagnosis not present

## 2022-02-04 DIAGNOSIS — N186 End stage renal disease: Secondary | ICD-10-CM | POA: Diagnosis not present

## 2022-02-04 DIAGNOSIS — N2581 Secondary hyperparathyroidism of renal origin: Secondary | ICD-10-CM | POA: Diagnosis not present

## 2022-02-04 DIAGNOSIS — D631 Anemia in chronic kidney disease: Secondary | ICD-10-CM | POA: Diagnosis not present

## 2022-02-06 DIAGNOSIS — N186 End stage renal disease: Secondary | ICD-10-CM | POA: Diagnosis not present

## 2022-02-06 DIAGNOSIS — Z992 Dependence on renal dialysis: Secondary | ICD-10-CM | POA: Diagnosis not present

## 2022-02-06 DIAGNOSIS — D631 Anemia in chronic kidney disease: Secondary | ICD-10-CM | POA: Diagnosis not present

## 2022-02-06 DIAGNOSIS — N2581 Secondary hyperparathyroidism of renal origin: Secondary | ICD-10-CM | POA: Diagnosis not present

## 2022-02-06 DIAGNOSIS — D509 Iron deficiency anemia, unspecified: Secondary | ICD-10-CM | POA: Diagnosis not present

## 2022-02-09 DIAGNOSIS — D631 Anemia in chronic kidney disease: Secondary | ICD-10-CM | POA: Diagnosis not present

## 2022-02-09 DIAGNOSIS — D509 Iron deficiency anemia, unspecified: Secondary | ICD-10-CM | POA: Diagnosis not present

## 2022-02-09 DIAGNOSIS — N186 End stage renal disease: Secondary | ICD-10-CM | POA: Diagnosis not present

## 2022-02-09 DIAGNOSIS — N2581 Secondary hyperparathyroidism of renal origin: Secondary | ICD-10-CM | POA: Diagnosis not present

## 2022-02-09 DIAGNOSIS — Z992 Dependence on renal dialysis: Secondary | ICD-10-CM | POA: Diagnosis not present

## 2022-02-10 DIAGNOSIS — C641 Malignant neoplasm of right kidney, except renal pelvis: Secondary | ICD-10-CM | POA: Diagnosis not present

## 2022-02-10 DIAGNOSIS — E1122 Type 2 diabetes mellitus with diabetic chronic kidney disease: Secondary | ICD-10-CM | POA: Diagnosis not present

## 2022-02-10 DIAGNOSIS — N184 Chronic kidney disease, stage 4 (severe): Secondary | ICD-10-CM | POA: Diagnosis not present

## 2022-02-10 DIAGNOSIS — Z5112 Encounter for antineoplastic immunotherapy: Secondary | ICD-10-CM | POA: Diagnosis not present

## 2022-02-10 DIAGNOSIS — I129 Hypertensive chronic kidney disease with stage 1 through stage 4 chronic kidney disease, or unspecified chronic kidney disease: Secondary | ICD-10-CM | POA: Diagnosis not present

## 2022-02-10 DIAGNOSIS — R911 Solitary pulmonary nodule: Secondary | ICD-10-CM | POA: Diagnosis not present

## 2022-02-10 DIAGNOSIS — Z79899 Other long term (current) drug therapy: Secondary | ICD-10-CM | POA: Diagnosis not present

## 2022-02-11 DIAGNOSIS — D509 Iron deficiency anemia, unspecified: Secondary | ICD-10-CM | POA: Diagnosis not present

## 2022-02-11 DIAGNOSIS — N2581 Secondary hyperparathyroidism of renal origin: Secondary | ICD-10-CM | POA: Diagnosis not present

## 2022-02-11 DIAGNOSIS — D631 Anemia in chronic kidney disease: Secondary | ICD-10-CM | POA: Diagnosis not present

## 2022-02-11 DIAGNOSIS — N186 End stage renal disease: Secondary | ICD-10-CM | POA: Diagnosis not present

## 2022-02-11 DIAGNOSIS — Z992 Dependence on renal dialysis: Secondary | ICD-10-CM | POA: Diagnosis not present

## 2022-02-13 DIAGNOSIS — Z992 Dependence on renal dialysis: Secondary | ICD-10-CM | POA: Diagnosis not present

## 2022-02-13 DIAGNOSIS — D631 Anemia in chronic kidney disease: Secondary | ICD-10-CM | POA: Diagnosis not present

## 2022-02-13 DIAGNOSIS — D509 Iron deficiency anemia, unspecified: Secondary | ICD-10-CM | POA: Diagnosis not present

## 2022-02-13 DIAGNOSIS — N186 End stage renal disease: Secondary | ICD-10-CM | POA: Diagnosis not present

## 2022-02-13 DIAGNOSIS — N2581 Secondary hyperparathyroidism of renal origin: Secondary | ICD-10-CM | POA: Diagnosis not present

## 2022-02-16 DIAGNOSIS — N2581 Secondary hyperparathyroidism of renal origin: Secondary | ICD-10-CM | POA: Diagnosis not present

## 2022-02-16 DIAGNOSIS — D509 Iron deficiency anemia, unspecified: Secondary | ICD-10-CM | POA: Diagnosis not present

## 2022-02-16 DIAGNOSIS — D631 Anemia in chronic kidney disease: Secondary | ICD-10-CM | POA: Diagnosis not present

## 2022-02-16 DIAGNOSIS — N186 End stage renal disease: Secondary | ICD-10-CM | POA: Diagnosis not present

## 2022-02-16 DIAGNOSIS — Z992 Dependence on renal dialysis: Secondary | ICD-10-CM | POA: Diagnosis not present

## 2022-02-18 DIAGNOSIS — Z992 Dependence on renal dialysis: Secondary | ICD-10-CM | POA: Diagnosis not present

## 2022-02-18 DIAGNOSIS — D509 Iron deficiency anemia, unspecified: Secondary | ICD-10-CM | POA: Diagnosis not present

## 2022-02-18 DIAGNOSIS — D631 Anemia in chronic kidney disease: Secondary | ICD-10-CM | POA: Diagnosis not present

## 2022-02-18 DIAGNOSIS — N2581 Secondary hyperparathyroidism of renal origin: Secondary | ICD-10-CM | POA: Diagnosis not present

## 2022-02-18 DIAGNOSIS — N186 End stage renal disease: Secondary | ICD-10-CM | POA: Diagnosis not present

## 2022-02-20 DIAGNOSIS — Z992 Dependence on renal dialysis: Secondary | ICD-10-CM | POA: Diagnosis not present

## 2022-02-20 DIAGNOSIS — D509 Iron deficiency anemia, unspecified: Secondary | ICD-10-CM | POA: Diagnosis not present

## 2022-02-20 DIAGNOSIS — N2581 Secondary hyperparathyroidism of renal origin: Secondary | ICD-10-CM | POA: Diagnosis not present

## 2022-02-20 DIAGNOSIS — D631 Anemia in chronic kidney disease: Secondary | ICD-10-CM | POA: Diagnosis not present

## 2022-02-20 DIAGNOSIS — N186 End stage renal disease: Secondary | ICD-10-CM | POA: Diagnosis not present

## 2022-02-21 ENCOUNTER — Other Ambulatory Visit: Payer: Self-pay | Admitting: Vascular Surgery

## 2022-02-22 DIAGNOSIS — Z794 Long term (current) use of insulin: Secondary | ICD-10-CM | POA: Diagnosis not present

## 2022-02-22 DIAGNOSIS — C641 Malignant neoplasm of right kidney, except renal pelvis: Secondary | ICD-10-CM | POA: Diagnosis not present

## 2022-02-22 DIAGNOSIS — I120 Hypertensive chronic kidney disease with stage 5 chronic kidney disease or end stage renal disease: Secondary | ICD-10-CM | POA: Diagnosis not present

## 2022-02-22 DIAGNOSIS — Z992 Dependence on renal dialysis: Secondary | ICD-10-CM | POA: Diagnosis not present

## 2022-02-22 DIAGNOSIS — I770 Arteriovenous fistula, acquired: Secondary | ICD-10-CM | POA: Diagnosis not present

## 2022-02-22 DIAGNOSIS — I82220 Acute embolism and thrombosis of inferior vena cava: Secondary | ICD-10-CM | POA: Diagnosis not present

## 2022-02-22 DIAGNOSIS — Z8546 Personal history of malignant neoplasm of prostate: Secondary | ICD-10-CM | POA: Diagnosis not present

## 2022-02-22 DIAGNOSIS — I69954 Hemiplegia and hemiparesis following unspecified cerebrovascular disease affecting left non-dominant side: Secondary | ICD-10-CM | POA: Diagnosis not present

## 2022-02-22 DIAGNOSIS — C7989 Secondary malignant neoplasm of other specified sites: Secondary | ICD-10-CM | POA: Diagnosis not present

## 2022-02-22 DIAGNOSIS — E1129 Type 2 diabetes mellitus with other diabetic kidney complication: Secondary | ICD-10-CM | POA: Diagnosis not present

## 2022-02-22 DIAGNOSIS — N186 End stage renal disease: Secondary | ICD-10-CM | POA: Diagnosis not present

## 2022-02-22 DIAGNOSIS — E1139 Type 2 diabetes mellitus with other diabetic ophthalmic complication: Secondary | ICD-10-CM | POA: Diagnosis not present

## 2022-02-23 DIAGNOSIS — D631 Anemia in chronic kidney disease: Secondary | ICD-10-CM | POA: Diagnosis not present

## 2022-02-23 DIAGNOSIS — N2581 Secondary hyperparathyroidism of renal origin: Secondary | ICD-10-CM | POA: Diagnosis not present

## 2022-02-23 DIAGNOSIS — Z992 Dependence on renal dialysis: Secondary | ICD-10-CM | POA: Diagnosis not present

## 2022-02-23 DIAGNOSIS — N186 End stage renal disease: Secondary | ICD-10-CM | POA: Diagnosis not present

## 2022-02-23 DIAGNOSIS — D509 Iron deficiency anemia, unspecified: Secondary | ICD-10-CM | POA: Diagnosis not present

## 2022-02-25 DIAGNOSIS — Z992 Dependence on renal dialysis: Secondary | ICD-10-CM | POA: Diagnosis not present

## 2022-02-25 DIAGNOSIS — D509 Iron deficiency anemia, unspecified: Secondary | ICD-10-CM | POA: Diagnosis not present

## 2022-02-25 DIAGNOSIS — N2581 Secondary hyperparathyroidism of renal origin: Secondary | ICD-10-CM | POA: Diagnosis not present

## 2022-02-25 DIAGNOSIS — D631 Anemia in chronic kidney disease: Secondary | ICD-10-CM | POA: Diagnosis not present

## 2022-02-25 DIAGNOSIS — N186 End stage renal disease: Secondary | ICD-10-CM | POA: Diagnosis not present

## 2022-02-27 DIAGNOSIS — D631 Anemia in chronic kidney disease: Secondary | ICD-10-CM | POA: Diagnosis not present

## 2022-02-27 DIAGNOSIS — D509 Iron deficiency anemia, unspecified: Secondary | ICD-10-CM | POA: Diagnosis not present

## 2022-02-27 DIAGNOSIS — N2581 Secondary hyperparathyroidism of renal origin: Secondary | ICD-10-CM | POA: Diagnosis not present

## 2022-02-27 DIAGNOSIS — N186 End stage renal disease: Secondary | ICD-10-CM | POA: Diagnosis not present

## 2022-02-27 DIAGNOSIS — Z992 Dependence on renal dialysis: Secondary | ICD-10-CM | POA: Diagnosis not present

## 2022-03-01 DIAGNOSIS — N2581 Secondary hyperparathyroidism of renal origin: Secondary | ICD-10-CM | POA: Diagnosis not present

## 2022-03-01 DIAGNOSIS — Z992 Dependence on renal dialysis: Secondary | ICD-10-CM | POA: Diagnosis not present

## 2022-03-01 DIAGNOSIS — N186 End stage renal disease: Secondary | ICD-10-CM | POA: Diagnosis not present

## 2022-03-01 DIAGNOSIS — D631 Anemia in chronic kidney disease: Secondary | ICD-10-CM | POA: Diagnosis not present

## 2022-03-01 DIAGNOSIS — D509 Iron deficiency anemia, unspecified: Secondary | ICD-10-CM | POA: Diagnosis not present

## 2022-03-03 DIAGNOSIS — N186 End stage renal disease: Secondary | ICD-10-CM | POA: Diagnosis not present

## 2022-03-03 DIAGNOSIS — D509 Iron deficiency anemia, unspecified: Secondary | ICD-10-CM | POA: Diagnosis not present

## 2022-03-03 DIAGNOSIS — Z992 Dependence on renal dialysis: Secondary | ICD-10-CM | POA: Diagnosis not present

## 2022-03-03 DIAGNOSIS — D631 Anemia in chronic kidney disease: Secondary | ICD-10-CM | POA: Diagnosis not present

## 2022-03-03 DIAGNOSIS — N2581 Secondary hyperparathyroidism of renal origin: Secondary | ICD-10-CM | POA: Diagnosis not present

## 2022-03-05 DIAGNOSIS — N2581 Secondary hyperparathyroidism of renal origin: Secondary | ICD-10-CM | POA: Diagnosis not present

## 2022-03-05 DIAGNOSIS — N186 End stage renal disease: Secondary | ICD-10-CM | POA: Diagnosis not present

## 2022-03-05 DIAGNOSIS — Z992 Dependence on renal dialysis: Secondary | ICD-10-CM | POA: Diagnosis not present

## 2022-03-09 DIAGNOSIS — D631 Anemia in chronic kidney disease: Secondary | ICD-10-CM | POA: Diagnosis not present

## 2022-03-09 DIAGNOSIS — N186 End stage renal disease: Secondary | ICD-10-CM | POA: Diagnosis not present

## 2022-03-09 DIAGNOSIS — D509 Iron deficiency anemia, unspecified: Secondary | ICD-10-CM | POA: Diagnosis not present

## 2022-03-09 DIAGNOSIS — N2581 Secondary hyperparathyroidism of renal origin: Secondary | ICD-10-CM | POA: Diagnosis not present

## 2022-03-09 DIAGNOSIS — Z992 Dependence on renal dialysis: Secondary | ICD-10-CM | POA: Diagnosis not present

## 2022-03-10 DIAGNOSIS — C641 Malignant neoplasm of right kidney, except renal pelvis: Secondary | ICD-10-CM | POA: Diagnosis not present

## 2022-03-10 DIAGNOSIS — Z8673 Personal history of transient ischemic attack (TIA), and cerebral infarction without residual deficits: Secondary | ICD-10-CM | POA: Diagnosis not present

## 2022-03-10 DIAGNOSIS — Z9181 History of falling: Secondary | ICD-10-CM | POA: Diagnosis not present

## 2022-03-10 DIAGNOSIS — Z5112 Encounter for antineoplastic immunotherapy: Secondary | ICD-10-CM | POA: Diagnosis not present

## 2022-03-11 DIAGNOSIS — D631 Anemia in chronic kidney disease: Secondary | ICD-10-CM | POA: Diagnosis not present

## 2022-03-11 DIAGNOSIS — Z992 Dependence on renal dialysis: Secondary | ICD-10-CM | POA: Diagnosis not present

## 2022-03-11 DIAGNOSIS — D509 Iron deficiency anemia, unspecified: Secondary | ICD-10-CM | POA: Diagnosis not present

## 2022-03-11 DIAGNOSIS — N186 End stage renal disease: Secondary | ICD-10-CM | POA: Diagnosis not present

## 2022-03-11 DIAGNOSIS — N2581 Secondary hyperparathyroidism of renal origin: Secondary | ICD-10-CM | POA: Diagnosis not present

## 2022-03-13 DIAGNOSIS — Z992 Dependence on renal dialysis: Secondary | ICD-10-CM | POA: Diagnosis not present

## 2022-03-13 DIAGNOSIS — D509 Iron deficiency anemia, unspecified: Secondary | ICD-10-CM | POA: Diagnosis not present

## 2022-03-13 DIAGNOSIS — N2581 Secondary hyperparathyroidism of renal origin: Secondary | ICD-10-CM | POA: Diagnosis not present

## 2022-03-13 DIAGNOSIS — N186 End stage renal disease: Secondary | ICD-10-CM | POA: Diagnosis not present

## 2022-03-13 DIAGNOSIS — D631 Anemia in chronic kidney disease: Secondary | ICD-10-CM | POA: Diagnosis not present

## 2022-03-15 DIAGNOSIS — D631 Anemia in chronic kidney disease: Secondary | ICD-10-CM | POA: Diagnosis not present

## 2022-03-15 DIAGNOSIS — I129 Hypertensive chronic kidney disease with stage 1 through stage 4 chronic kidney disease, or unspecified chronic kidney disease: Secondary | ICD-10-CM | POA: Diagnosis not present

## 2022-03-15 DIAGNOSIS — J45909 Unspecified asthma, uncomplicated: Secondary | ICD-10-CM | POA: Diagnosis not present

## 2022-03-15 DIAGNOSIS — Z8546 Personal history of malignant neoplasm of prostate: Secondary | ICD-10-CM | POA: Diagnosis not present

## 2022-03-15 DIAGNOSIS — I69354 Hemiplegia and hemiparesis following cerebral infarction affecting left non-dominant side: Secondary | ICD-10-CM | POA: Diagnosis not present

## 2022-03-15 DIAGNOSIS — R296 Repeated falls: Secondary | ICD-10-CM | POA: Diagnosis not present

## 2022-03-15 DIAGNOSIS — Z87891 Personal history of nicotine dependence: Secondary | ICD-10-CM | POA: Diagnosis not present

## 2022-03-15 DIAGNOSIS — Z7901 Long term (current) use of anticoagulants: Secondary | ICD-10-CM | POA: Diagnosis not present

## 2022-03-15 DIAGNOSIS — I2510 Atherosclerotic heart disease of native coronary artery without angina pectoris: Secondary | ICD-10-CM | POA: Diagnosis not present

## 2022-03-15 DIAGNOSIS — Z794 Long term (current) use of insulin: Secondary | ICD-10-CM | POA: Diagnosis not present

## 2022-03-15 DIAGNOSIS — Z992 Dependence on renal dialysis: Secondary | ICD-10-CM | POA: Diagnosis not present

## 2022-03-15 DIAGNOSIS — N186 End stage renal disease: Secondary | ICD-10-CM | POA: Diagnosis not present

## 2022-03-15 DIAGNOSIS — Z905 Acquired absence of kidney: Secondary | ICD-10-CM | POA: Diagnosis not present

## 2022-03-15 DIAGNOSIS — C641 Malignant neoplasm of right kidney, except renal pelvis: Secondary | ICD-10-CM | POA: Diagnosis not present

## 2022-03-15 DIAGNOSIS — Z8673 Personal history of transient ischemic attack (TIA), and cerebral infarction without residual deficits: Secondary | ICD-10-CM | POA: Diagnosis not present

## 2022-03-15 DIAGNOSIS — E1122 Type 2 diabetes mellitus with diabetic chronic kidney disease: Secondary | ICD-10-CM | POA: Diagnosis not present

## 2022-03-15 DIAGNOSIS — Z9181 History of falling: Secondary | ICD-10-CM | POA: Diagnosis not present

## 2022-03-16 DIAGNOSIS — Z992 Dependence on renal dialysis: Secondary | ICD-10-CM | POA: Diagnosis not present

## 2022-03-16 DIAGNOSIS — D631 Anemia in chronic kidney disease: Secondary | ICD-10-CM | POA: Diagnosis not present

## 2022-03-16 DIAGNOSIS — N2581 Secondary hyperparathyroidism of renal origin: Secondary | ICD-10-CM | POA: Diagnosis not present

## 2022-03-16 DIAGNOSIS — N186 End stage renal disease: Secondary | ICD-10-CM | POA: Diagnosis not present

## 2022-03-16 DIAGNOSIS — D509 Iron deficiency anemia, unspecified: Secondary | ICD-10-CM | POA: Diagnosis not present

## 2022-03-18 DIAGNOSIS — D631 Anemia in chronic kidney disease: Secondary | ICD-10-CM | POA: Diagnosis not present

## 2022-03-18 DIAGNOSIS — Z992 Dependence on renal dialysis: Secondary | ICD-10-CM | POA: Diagnosis not present

## 2022-03-18 DIAGNOSIS — N2581 Secondary hyperparathyroidism of renal origin: Secondary | ICD-10-CM | POA: Diagnosis not present

## 2022-03-18 DIAGNOSIS — N186 End stage renal disease: Secondary | ICD-10-CM | POA: Diagnosis not present

## 2022-03-18 DIAGNOSIS — D509 Iron deficiency anemia, unspecified: Secondary | ICD-10-CM | POA: Diagnosis not present

## 2022-03-20 DIAGNOSIS — D631 Anemia in chronic kidney disease: Secondary | ICD-10-CM | POA: Diagnosis not present

## 2022-03-20 DIAGNOSIS — N186 End stage renal disease: Secondary | ICD-10-CM | POA: Diagnosis not present

## 2022-03-20 DIAGNOSIS — D509 Iron deficiency anemia, unspecified: Secondary | ICD-10-CM | POA: Diagnosis not present

## 2022-03-20 DIAGNOSIS — Z992 Dependence on renal dialysis: Secondary | ICD-10-CM | POA: Diagnosis not present

## 2022-03-20 DIAGNOSIS — N2581 Secondary hyperparathyroidism of renal origin: Secondary | ICD-10-CM | POA: Diagnosis not present

## 2022-03-23 DIAGNOSIS — N186 End stage renal disease: Secondary | ICD-10-CM | POA: Diagnosis not present

## 2022-03-23 DIAGNOSIS — D631 Anemia in chronic kidney disease: Secondary | ICD-10-CM | POA: Diagnosis not present

## 2022-03-23 DIAGNOSIS — D509 Iron deficiency anemia, unspecified: Secondary | ICD-10-CM | POA: Diagnosis not present

## 2022-03-23 DIAGNOSIS — Z992 Dependence on renal dialysis: Secondary | ICD-10-CM | POA: Diagnosis not present

## 2022-03-23 DIAGNOSIS — N2581 Secondary hyperparathyroidism of renal origin: Secondary | ICD-10-CM | POA: Diagnosis not present

## 2022-03-24 DIAGNOSIS — D631 Anemia in chronic kidney disease: Secondary | ICD-10-CM | POA: Diagnosis not present

## 2022-03-24 DIAGNOSIS — N186 End stage renal disease: Secondary | ICD-10-CM | POA: Diagnosis not present

## 2022-03-24 DIAGNOSIS — E1122 Type 2 diabetes mellitus with diabetic chronic kidney disease: Secondary | ICD-10-CM | POA: Diagnosis not present

## 2022-03-24 DIAGNOSIS — Z992 Dependence on renal dialysis: Secondary | ICD-10-CM | POA: Diagnosis not present

## 2022-03-24 DIAGNOSIS — C641 Malignant neoplasm of right kidney, except renal pelvis: Secondary | ICD-10-CM | POA: Diagnosis not present

## 2022-03-24 DIAGNOSIS — I129 Hypertensive chronic kidney disease with stage 1 through stage 4 chronic kidney disease, or unspecified chronic kidney disease: Secondary | ICD-10-CM | POA: Diagnosis not present

## 2022-03-25 DIAGNOSIS — N186 End stage renal disease: Secondary | ICD-10-CM | POA: Diagnosis not present

## 2022-03-25 DIAGNOSIS — Z992 Dependence on renal dialysis: Secondary | ICD-10-CM | POA: Diagnosis not present

## 2022-03-25 DIAGNOSIS — D631 Anemia in chronic kidney disease: Secondary | ICD-10-CM | POA: Diagnosis not present

## 2022-03-25 DIAGNOSIS — N2581 Secondary hyperparathyroidism of renal origin: Secondary | ICD-10-CM | POA: Diagnosis not present

## 2022-03-25 DIAGNOSIS — D509 Iron deficiency anemia, unspecified: Secondary | ICD-10-CM | POA: Diagnosis not present

## 2022-03-26 DIAGNOSIS — C641 Malignant neoplasm of right kidney, except renal pelvis: Secondary | ICD-10-CM | POA: Diagnosis not present

## 2022-03-26 DIAGNOSIS — D631 Anemia in chronic kidney disease: Secondary | ICD-10-CM | POA: Diagnosis not present

## 2022-03-26 DIAGNOSIS — E1122 Type 2 diabetes mellitus with diabetic chronic kidney disease: Secondary | ICD-10-CM | POA: Diagnosis not present

## 2022-03-26 DIAGNOSIS — Z992 Dependence on renal dialysis: Secondary | ICD-10-CM | POA: Diagnosis not present

## 2022-03-26 DIAGNOSIS — I129 Hypertensive chronic kidney disease with stage 1 through stage 4 chronic kidney disease, or unspecified chronic kidney disease: Secondary | ICD-10-CM | POA: Diagnosis not present

## 2022-03-26 DIAGNOSIS — N186 End stage renal disease: Secondary | ICD-10-CM | POA: Diagnosis not present

## 2022-03-27 DIAGNOSIS — N186 End stage renal disease: Secondary | ICD-10-CM | POA: Diagnosis not present

## 2022-03-27 DIAGNOSIS — D509 Iron deficiency anemia, unspecified: Secondary | ICD-10-CM | POA: Diagnosis not present

## 2022-03-27 DIAGNOSIS — N2581 Secondary hyperparathyroidism of renal origin: Secondary | ICD-10-CM | POA: Diagnosis not present

## 2022-03-27 DIAGNOSIS — Z992 Dependence on renal dialysis: Secondary | ICD-10-CM | POA: Diagnosis not present

## 2022-03-27 DIAGNOSIS — D631 Anemia in chronic kidney disease: Secondary | ICD-10-CM | POA: Diagnosis not present

## 2022-03-30 DIAGNOSIS — D509 Iron deficiency anemia, unspecified: Secondary | ICD-10-CM | POA: Diagnosis not present

## 2022-03-30 DIAGNOSIS — N186 End stage renal disease: Secondary | ICD-10-CM | POA: Diagnosis not present

## 2022-03-30 DIAGNOSIS — Z992 Dependence on renal dialysis: Secondary | ICD-10-CM | POA: Diagnosis not present

## 2022-03-30 DIAGNOSIS — N2581 Secondary hyperparathyroidism of renal origin: Secondary | ICD-10-CM | POA: Diagnosis not present

## 2022-03-30 DIAGNOSIS — D631 Anemia in chronic kidney disease: Secondary | ICD-10-CM | POA: Diagnosis not present

## 2022-03-31 DIAGNOSIS — C641 Malignant neoplasm of right kidney, except renal pelvis: Secondary | ICD-10-CM | POA: Diagnosis not present

## 2022-03-31 DIAGNOSIS — Z992 Dependence on renal dialysis: Secondary | ICD-10-CM | POA: Diagnosis not present

## 2022-03-31 DIAGNOSIS — N186 End stage renal disease: Secondary | ICD-10-CM | POA: Diagnosis not present

## 2022-03-31 DIAGNOSIS — I129 Hypertensive chronic kidney disease with stage 1 through stage 4 chronic kidney disease, or unspecified chronic kidney disease: Secondary | ICD-10-CM | POA: Diagnosis not present

## 2022-03-31 DIAGNOSIS — D631 Anemia in chronic kidney disease: Secondary | ICD-10-CM | POA: Diagnosis not present

## 2022-03-31 DIAGNOSIS — E1122 Type 2 diabetes mellitus with diabetic chronic kidney disease: Secondary | ICD-10-CM | POA: Diagnosis not present

## 2022-04-01 DIAGNOSIS — N186 End stage renal disease: Secondary | ICD-10-CM | POA: Diagnosis not present

## 2022-04-01 DIAGNOSIS — Z992 Dependence on renal dialysis: Secondary | ICD-10-CM | POA: Diagnosis not present

## 2022-04-01 DIAGNOSIS — D631 Anemia in chronic kidney disease: Secondary | ICD-10-CM | POA: Diagnosis not present

## 2022-04-01 DIAGNOSIS — D509 Iron deficiency anemia, unspecified: Secondary | ICD-10-CM | POA: Diagnosis not present

## 2022-04-01 DIAGNOSIS — N2581 Secondary hyperparathyroidism of renal origin: Secondary | ICD-10-CM | POA: Diagnosis not present

## 2022-04-02 DIAGNOSIS — I129 Hypertensive chronic kidney disease with stage 1 through stage 4 chronic kidney disease, or unspecified chronic kidney disease: Secondary | ICD-10-CM | POA: Diagnosis not present

## 2022-04-02 DIAGNOSIS — E1122 Type 2 diabetes mellitus with diabetic chronic kidney disease: Secondary | ICD-10-CM | POA: Diagnosis not present

## 2022-04-02 DIAGNOSIS — N186 End stage renal disease: Secondary | ICD-10-CM | POA: Diagnosis not present

## 2022-04-02 DIAGNOSIS — D631 Anemia in chronic kidney disease: Secondary | ICD-10-CM | POA: Diagnosis not present

## 2022-04-02 DIAGNOSIS — C641 Malignant neoplasm of right kidney, except renal pelvis: Secondary | ICD-10-CM | POA: Diagnosis not present

## 2022-04-02 DIAGNOSIS — Z992 Dependence on renal dialysis: Secondary | ICD-10-CM | POA: Diagnosis not present

## 2022-04-03 DIAGNOSIS — D631 Anemia in chronic kidney disease: Secondary | ICD-10-CM | POA: Diagnosis not present

## 2022-04-03 DIAGNOSIS — N186 End stage renal disease: Secondary | ICD-10-CM | POA: Diagnosis not present

## 2022-04-03 DIAGNOSIS — N2581 Secondary hyperparathyroidism of renal origin: Secondary | ICD-10-CM | POA: Diagnosis not present

## 2022-04-03 DIAGNOSIS — Z992 Dependence on renal dialysis: Secondary | ICD-10-CM | POA: Diagnosis not present

## 2022-04-03 DIAGNOSIS — D509 Iron deficiency anemia, unspecified: Secondary | ICD-10-CM | POA: Diagnosis not present

## 2022-04-05 DIAGNOSIS — D509 Iron deficiency anemia, unspecified: Secondary | ICD-10-CM | POA: Diagnosis not present

## 2022-04-05 DIAGNOSIS — D631 Anemia in chronic kidney disease: Secondary | ICD-10-CM | POA: Diagnosis not present

## 2022-04-05 DIAGNOSIS — N2581 Secondary hyperparathyroidism of renal origin: Secondary | ICD-10-CM | POA: Diagnosis not present

## 2022-04-05 DIAGNOSIS — C641 Malignant neoplasm of right kidney, except renal pelvis: Secondary | ICD-10-CM | POA: Diagnosis not present

## 2022-04-05 DIAGNOSIS — N186 End stage renal disease: Secondary | ICD-10-CM | POA: Diagnosis not present

## 2022-04-05 DIAGNOSIS — I129 Hypertensive chronic kidney disease with stage 1 through stage 4 chronic kidney disease, or unspecified chronic kidney disease: Secondary | ICD-10-CM | POA: Diagnosis not present

## 2022-04-05 DIAGNOSIS — Z992 Dependence on renal dialysis: Secondary | ICD-10-CM | POA: Diagnosis not present

## 2022-04-05 DIAGNOSIS — E1122 Type 2 diabetes mellitus with diabetic chronic kidney disease: Secondary | ICD-10-CM | POA: Diagnosis not present

## 2022-04-06 DIAGNOSIS — N186 End stage renal disease: Secondary | ICD-10-CM | POA: Diagnosis not present

## 2022-04-06 DIAGNOSIS — D631 Anemia in chronic kidney disease: Secondary | ICD-10-CM | POA: Diagnosis not present

## 2022-04-06 DIAGNOSIS — N2581 Secondary hyperparathyroidism of renal origin: Secondary | ICD-10-CM | POA: Diagnosis not present

## 2022-04-06 DIAGNOSIS — D509 Iron deficiency anemia, unspecified: Secondary | ICD-10-CM | POA: Diagnosis not present

## 2022-04-06 DIAGNOSIS — Z992 Dependence on renal dialysis: Secondary | ICD-10-CM | POA: Diagnosis not present

## 2022-04-08 DIAGNOSIS — N186 End stage renal disease: Secondary | ICD-10-CM | POA: Diagnosis not present

## 2022-04-08 DIAGNOSIS — D509 Iron deficiency anemia, unspecified: Secondary | ICD-10-CM | POA: Diagnosis not present

## 2022-04-08 DIAGNOSIS — N2581 Secondary hyperparathyroidism of renal origin: Secondary | ICD-10-CM | POA: Diagnosis not present

## 2022-04-08 DIAGNOSIS — Z992 Dependence on renal dialysis: Secondary | ICD-10-CM | POA: Diagnosis not present

## 2022-04-08 DIAGNOSIS — D631 Anemia in chronic kidney disease: Secondary | ICD-10-CM | POA: Diagnosis not present

## 2022-04-09 DIAGNOSIS — Z992 Dependence on renal dialysis: Secondary | ICD-10-CM | POA: Diagnosis not present

## 2022-04-09 DIAGNOSIS — Z79899 Other long term (current) drug therapy: Secondary | ICD-10-CM | POA: Diagnosis not present

## 2022-04-09 DIAGNOSIS — N184 Chronic kidney disease, stage 4 (severe): Secondary | ICD-10-CM | POA: Diagnosis not present

## 2022-04-09 DIAGNOSIS — I129 Hypertensive chronic kidney disease with stage 1 through stage 4 chronic kidney disease, or unspecified chronic kidney disease: Secondary | ICD-10-CM | POA: Diagnosis not present

## 2022-04-09 DIAGNOSIS — Z9181 History of falling: Secondary | ICD-10-CM | POA: Diagnosis not present

## 2022-04-09 DIAGNOSIS — Z5112 Encounter for antineoplastic immunotherapy: Secondary | ICD-10-CM | POA: Diagnosis not present

## 2022-04-09 DIAGNOSIS — C641 Malignant neoplasm of right kidney, except renal pelvis: Secondary | ICD-10-CM | POA: Diagnosis not present

## 2022-04-09 DIAGNOSIS — M21379 Foot drop, unspecified foot: Secondary | ICD-10-CM | POA: Diagnosis not present

## 2022-04-09 DIAGNOSIS — L309 Dermatitis, unspecified: Secondary | ICD-10-CM | POA: Diagnosis not present

## 2022-04-09 DIAGNOSIS — Z905 Acquired absence of kidney: Secondary | ICD-10-CM | POA: Diagnosis not present

## 2022-04-09 DIAGNOSIS — E1122 Type 2 diabetes mellitus with diabetic chronic kidney disease: Secondary | ICD-10-CM | POA: Diagnosis not present

## 2022-04-10 DIAGNOSIS — N2581 Secondary hyperparathyroidism of renal origin: Secondary | ICD-10-CM | POA: Diagnosis not present

## 2022-04-10 DIAGNOSIS — N186 End stage renal disease: Secondary | ICD-10-CM | POA: Diagnosis not present

## 2022-04-10 DIAGNOSIS — Z992 Dependence on renal dialysis: Secondary | ICD-10-CM | POA: Diagnosis not present

## 2022-04-10 DIAGNOSIS — D509 Iron deficiency anemia, unspecified: Secondary | ICD-10-CM | POA: Diagnosis not present

## 2022-04-10 DIAGNOSIS — D631 Anemia in chronic kidney disease: Secondary | ICD-10-CM | POA: Diagnosis not present

## 2022-04-13 DIAGNOSIS — D509 Iron deficiency anemia, unspecified: Secondary | ICD-10-CM | POA: Diagnosis not present

## 2022-04-13 DIAGNOSIS — Z992 Dependence on renal dialysis: Secondary | ICD-10-CM | POA: Diagnosis not present

## 2022-04-13 DIAGNOSIS — N2581 Secondary hyperparathyroidism of renal origin: Secondary | ICD-10-CM | POA: Diagnosis not present

## 2022-04-13 DIAGNOSIS — N186 End stage renal disease: Secondary | ICD-10-CM | POA: Diagnosis not present

## 2022-04-13 DIAGNOSIS — D631 Anemia in chronic kidney disease: Secondary | ICD-10-CM | POA: Diagnosis not present

## 2022-04-14 DIAGNOSIS — J45909 Unspecified asthma, uncomplicated: Secondary | ICD-10-CM | POA: Diagnosis not present

## 2022-04-14 DIAGNOSIS — C641 Malignant neoplasm of right kidney, except renal pelvis: Secondary | ICD-10-CM | POA: Diagnosis not present

## 2022-04-14 DIAGNOSIS — R296 Repeated falls: Secondary | ICD-10-CM | POA: Diagnosis not present

## 2022-04-14 DIAGNOSIS — Z8546 Personal history of malignant neoplasm of prostate: Secondary | ICD-10-CM | POA: Diagnosis not present

## 2022-04-14 DIAGNOSIS — Z9181 History of falling: Secondary | ICD-10-CM | POA: Diagnosis not present

## 2022-04-14 DIAGNOSIS — Z794 Long term (current) use of insulin: Secondary | ICD-10-CM | POA: Diagnosis not present

## 2022-04-14 DIAGNOSIS — I129 Hypertensive chronic kidney disease with stage 1 through stage 4 chronic kidney disease, or unspecified chronic kidney disease: Secondary | ICD-10-CM | POA: Diagnosis not present

## 2022-04-14 DIAGNOSIS — N186 End stage renal disease: Secondary | ICD-10-CM | POA: Diagnosis not present

## 2022-04-14 DIAGNOSIS — I2510 Atherosclerotic heart disease of native coronary artery without angina pectoris: Secondary | ICD-10-CM | POA: Diagnosis not present

## 2022-04-14 DIAGNOSIS — I69354 Hemiplegia and hemiparesis following cerebral infarction affecting left non-dominant side: Secondary | ICD-10-CM | POA: Diagnosis not present

## 2022-04-14 DIAGNOSIS — Z7901 Long term (current) use of anticoagulants: Secondary | ICD-10-CM | POA: Diagnosis not present

## 2022-04-14 DIAGNOSIS — Z87891 Personal history of nicotine dependence: Secondary | ICD-10-CM | POA: Diagnosis not present

## 2022-04-14 DIAGNOSIS — E1122 Type 2 diabetes mellitus with diabetic chronic kidney disease: Secondary | ICD-10-CM | POA: Diagnosis not present

## 2022-04-14 DIAGNOSIS — Z905 Acquired absence of kidney: Secondary | ICD-10-CM | POA: Diagnosis not present

## 2022-04-14 DIAGNOSIS — D631 Anemia in chronic kidney disease: Secondary | ICD-10-CM | POA: Diagnosis not present

## 2022-04-14 DIAGNOSIS — Z8673 Personal history of transient ischemic attack (TIA), and cerebral infarction without residual deficits: Secondary | ICD-10-CM | POA: Diagnosis not present

## 2022-04-14 DIAGNOSIS — Z992 Dependence on renal dialysis: Secondary | ICD-10-CM | POA: Diagnosis not present

## 2022-04-15 DIAGNOSIS — Z992 Dependence on renal dialysis: Secondary | ICD-10-CM | POA: Diagnosis not present

## 2022-04-15 DIAGNOSIS — N2581 Secondary hyperparathyroidism of renal origin: Secondary | ICD-10-CM | POA: Diagnosis not present

## 2022-04-15 DIAGNOSIS — N186 End stage renal disease: Secondary | ICD-10-CM | POA: Diagnosis not present

## 2022-04-15 DIAGNOSIS — D631 Anemia in chronic kidney disease: Secondary | ICD-10-CM | POA: Diagnosis not present

## 2022-04-15 DIAGNOSIS — D509 Iron deficiency anemia, unspecified: Secondary | ICD-10-CM | POA: Diagnosis not present

## 2022-04-17 DIAGNOSIS — N2581 Secondary hyperparathyroidism of renal origin: Secondary | ICD-10-CM | POA: Diagnosis not present

## 2022-04-17 DIAGNOSIS — Z992 Dependence on renal dialysis: Secondary | ICD-10-CM | POA: Diagnosis not present

## 2022-04-17 DIAGNOSIS — N186 End stage renal disease: Secondary | ICD-10-CM | POA: Diagnosis not present

## 2022-04-17 DIAGNOSIS — D631 Anemia in chronic kidney disease: Secondary | ICD-10-CM | POA: Diagnosis not present

## 2022-04-17 DIAGNOSIS — D509 Iron deficiency anemia, unspecified: Secondary | ICD-10-CM | POA: Diagnosis not present

## 2022-04-19 DIAGNOSIS — N186 End stage renal disease: Secondary | ICD-10-CM | POA: Diagnosis not present

## 2022-04-19 DIAGNOSIS — C641 Malignant neoplasm of right kidney, except renal pelvis: Secondary | ICD-10-CM | POA: Diagnosis not present

## 2022-04-19 DIAGNOSIS — I129 Hypertensive chronic kidney disease with stage 1 through stage 4 chronic kidney disease, or unspecified chronic kidney disease: Secondary | ICD-10-CM | POA: Diagnosis not present

## 2022-04-19 DIAGNOSIS — Z992 Dependence on renal dialysis: Secondary | ICD-10-CM | POA: Diagnosis not present

## 2022-04-19 DIAGNOSIS — E1122 Type 2 diabetes mellitus with diabetic chronic kidney disease: Secondary | ICD-10-CM | POA: Diagnosis not present

## 2022-04-19 DIAGNOSIS — D631 Anemia in chronic kidney disease: Secondary | ICD-10-CM | POA: Diagnosis not present

## 2022-04-20 DIAGNOSIS — D631 Anemia in chronic kidney disease: Secondary | ICD-10-CM | POA: Diagnosis not present

## 2022-04-20 DIAGNOSIS — D509 Iron deficiency anemia, unspecified: Secondary | ICD-10-CM | POA: Diagnosis not present

## 2022-04-20 DIAGNOSIS — N186 End stage renal disease: Secondary | ICD-10-CM | POA: Diagnosis not present

## 2022-04-20 DIAGNOSIS — Z992 Dependence on renal dialysis: Secondary | ICD-10-CM | POA: Diagnosis not present

## 2022-04-20 DIAGNOSIS — N2581 Secondary hyperparathyroidism of renal origin: Secondary | ICD-10-CM | POA: Diagnosis not present

## 2022-04-22 DIAGNOSIS — Z992 Dependence on renal dialysis: Secondary | ICD-10-CM | POA: Diagnosis not present

## 2022-04-22 DIAGNOSIS — D631 Anemia in chronic kidney disease: Secondary | ICD-10-CM | POA: Diagnosis not present

## 2022-04-22 DIAGNOSIS — D509 Iron deficiency anemia, unspecified: Secondary | ICD-10-CM | POA: Diagnosis not present

## 2022-04-22 DIAGNOSIS — N186 End stage renal disease: Secondary | ICD-10-CM | POA: Diagnosis not present

## 2022-04-22 DIAGNOSIS — N2581 Secondary hyperparathyroidism of renal origin: Secondary | ICD-10-CM | POA: Diagnosis not present

## 2022-04-24 DIAGNOSIS — N2581 Secondary hyperparathyroidism of renal origin: Secondary | ICD-10-CM | POA: Diagnosis not present

## 2022-04-24 DIAGNOSIS — D631 Anemia in chronic kidney disease: Secondary | ICD-10-CM | POA: Diagnosis not present

## 2022-04-24 DIAGNOSIS — N186 End stage renal disease: Secondary | ICD-10-CM | POA: Diagnosis not present

## 2022-04-24 DIAGNOSIS — Z992 Dependence on renal dialysis: Secondary | ICD-10-CM | POA: Diagnosis not present

## 2022-04-24 DIAGNOSIS — D509 Iron deficiency anemia, unspecified: Secondary | ICD-10-CM | POA: Diagnosis not present

## 2022-04-27 DIAGNOSIS — D631 Anemia in chronic kidney disease: Secondary | ICD-10-CM | POA: Diagnosis not present

## 2022-04-27 DIAGNOSIS — Z992 Dependence on renal dialysis: Secondary | ICD-10-CM | POA: Diagnosis not present

## 2022-04-27 DIAGNOSIS — D509 Iron deficiency anemia, unspecified: Secondary | ICD-10-CM | POA: Diagnosis not present

## 2022-04-27 DIAGNOSIS — N2581 Secondary hyperparathyroidism of renal origin: Secondary | ICD-10-CM | POA: Diagnosis not present

## 2022-04-27 DIAGNOSIS — N186 End stage renal disease: Secondary | ICD-10-CM | POA: Diagnosis not present

## 2022-04-29 DIAGNOSIS — N186 End stage renal disease: Secondary | ICD-10-CM | POA: Diagnosis not present

## 2022-04-29 DIAGNOSIS — N2581 Secondary hyperparathyroidism of renal origin: Secondary | ICD-10-CM | POA: Diagnosis not present

## 2022-04-29 DIAGNOSIS — D631 Anemia in chronic kidney disease: Secondary | ICD-10-CM | POA: Diagnosis not present

## 2022-04-29 DIAGNOSIS — Z992 Dependence on renal dialysis: Secondary | ICD-10-CM | POA: Diagnosis not present

## 2022-04-29 DIAGNOSIS — D509 Iron deficiency anemia, unspecified: Secondary | ICD-10-CM | POA: Diagnosis not present

## 2022-05-01 DIAGNOSIS — D631 Anemia in chronic kidney disease: Secondary | ICD-10-CM | POA: Diagnosis not present

## 2022-05-01 DIAGNOSIS — N2581 Secondary hyperparathyroidism of renal origin: Secondary | ICD-10-CM | POA: Diagnosis not present

## 2022-05-01 DIAGNOSIS — N186 End stage renal disease: Secondary | ICD-10-CM | POA: Diagnosis not present

## 2022-05-01 DIAGNOSIS — Z992 Dependence on renal dialysis: Secondary | ICD-10-CM | POA: Diagnosis not present

## 2022-05-01 DIAGNOSIS — D509 Iron deficiency anemia, unspecified: Secondary | ICD-10-CM | POA: Diagnosis not present

## 2022-05-04 DIAGNOSIS — Z992 Dependence on renal dialysis: Secondary | ICD-10-CM | POA: Diagnosis not present

## 2022-05-04 DIAGNOSIS — D509 Iron deficiency anemia, unspecified: Secondary | ICD-10-CM | POA: Diagnosis not present

## 2022-05-04 DIAGNOSIS — D631 Anemia in chronic kidney disease: Secondary | ICD-10-CM | POA: Diagnosis not present

## 2022-05-04 DIAGNOSIS — N186 End stage renal disease: Secondary | ICD-10-CM | POA: Diagnosis not present

## 2022-05-04 DIAGNOSIS — N2581 Secondary hyperparathyroidism of renal origin: Secondary | ICD-10-CM | POA: Diagnosis not present

## 2022-05-05 DIAGNOSIS — N186 End stage renal disease: Secondary | ICD-10-CM | POA: Diagnosis not present

## 2022-05-05 DIAGNOSIS — C649 Malignant neoplasm of unspecified kidney, except renal pelvis: Secondary | ICD-10-CM | POA: Diagnosis not present

## 2022-05-05 DIAGNOSIS — Z992 Dependence on renal dialysis: Secondary | ICD-10-CM | POA: Diagnosis not present

## 2022-05-06 DIAGNOSIS — D631 Anemia in chronic kidney disease: Secondary | ICD-10-CM | POA: Diagnosis not present

## 2022-05-06 DIAGNOSIS — Z992 Dependence on renal dialysis: Secondary | ICD-10-CM | POA: Diagnosis not present

## 2022-05-06 DIAGNOSIS — N2581 Secondary hyperparathyroidism of renal origin: Secondary | ICD-10-CM | POA: Diagnosis not present

## 2022-05-06 DIAGNOSIS — N186 End stage renal disease: Secondary | ICD-10-CM | POA: Diagnosis not present

## 2022-05-07 DIAGNOSIS — I129 Hypertensive chronic kidney disease with stage 1 through stage 4 chronic kidney disease, or unspecified chronic kidney disease: Secondary | ICD-10-CM | POA: Diagnosis not present

## 2022-05-07 DIAGNOSIS — Z992 Dependence on renal dialysis: Secondary | ICD-10-CM | POA: Diagnosis not present

## 2022-05-07 DIAGNOSIS — C641 Malignant neoplasm of right kidney, except renal pelvis: Secondary | ICD-10-CM | POA: Diagnosis not present

## 2022-05-07 DIAGNOSIS — D631 Anemia in chronic kidney disease: Secondary | ICD-10-CM | POA: Diagnosis not present

## 2022-05-07 DIAGNOSIS — E1122 Type 2 diabetes mellitus with diabetic chronic kidney disease: Secondary | ICD-10-CM | POA: Diagnosis not present

## 2022-05-07 DIAGNOSIS — N186 End stage renal disease: Secondary | ICD-10-CM | POA: Diagnosis not present

## 2022-05-08 DIAGNOSIS — N2581 Secondary hyperparathyroidism of renal origin: Secondary | ICD-10-CM | POA: Diagnosis not present

## 2022-05-08 DIAGNOSIS — Z992 Dependence on renal dialysis: Secondary | ICD-10-CM | POA: Diagnosis not present

## 2022-05-08 DIAGNOSIS — D631 Anemia in chronic kidney disease: Secondary | ICD-10-CM | POA: Diagnosis not present

## 2022-05-08 DIAGNOSIS — N186 End stage renal disease: Secondary | ICD-10-CM | POA: Diagnosis not present

## 2022-05-10 DIAGNOSIS — D631 Anemia in chronic kidney disease: Secondary | ICD-10-CM | POA: Diagnosis not present

## 2022-05-10 DIAGNOSIS — N186 End stage renal disease: Secondary | ICD-10-CM | POA: Diagnosis not present

## 2022-05-10 DIAGNOSIS — I129 Hypertensive chronic kidney disease with stage 1 through stage 4 chronic kidney disease, or unspecified chronic kidney disease: Secondary | ICD-10-CM | POA: Diagnosis not present

## 2022-05-10 DIAGNOSIS — E1122 Type 2 diabetes mellitus with diabetic chronic kidney disease: Secondary | ICD-10-CM | POA: Diagnosis not present

## 2022-05-10 DIAGNOSIS — C641 Malignant neoplasm of right kidney, except renal pelvis: Secondary | ICD-10-CM | POA: Diagnosis not present

## 2022-05-10 DIAGNOSIS — Z992 Dependence on renal dialysis: Secondary | ICD-10-CM | POA: Diagnosis not present

## 2022-05-11 DIAGNOSIS — Z992 Dependence on renal dialysis: Secondary | ICD-10-CM | POA: Diagnosis not present

## 2022-05-11 DIAGNOSIS — N186 End stage renal disease: Secondary | ICD-10-CM | POA: Diagnosis not present

## 2022-05-11 DIAGNOSIS — D631 Anemia in chronic kidney disease: Secondary | ICD-10-CM | POA: Diagnosis not present

## 2022-05-11 DIAGNOSIS — N2581 Secondary hyperparathyroidism of renal origin: Secondary | ICD-10-CM | POA: Diagnosis not present

## 2022-05-12 DIAGNOSIS — R918 Other nonspecific abnormal finding of lung field: Secondary | ICD-10-CM | POA: Diagnosis not present

## 2022-05-12 DIAGNOSIS — Z79899 Other long term (current) drug therapy: Secondary | ICD-10-CM | POA: Diagnosis not present

## 2022-05-12 DIAGNOSIS — Z5112 Encounter for antineoplastic immunotherapy: Secondary | ICD-10-CM | POA: Diagnosis not present

## 2022-05-12 DIAGNOSIS — C641 Malignant neoplasm of right kidney, except renal pelvis: Secondary | ICD-10-CM | POA: Diagnosis not present

## 2022-05-13 DIAGNOSIS — Z992 Dependence on renal dialysis: Secondary | ICD-10-CM | POA: Diagnosis not present

## 2022-05-13 DIAGNOSIS — D631 Anemia in chronic kidney disease: Secondary | ICD-10-CM | POA: Diagnosis not present

## 2022-05-13 DIAGNOSIS — N2581 Secondary hyperparathyroidism of renal origin: Secondary | ICD-10-CM | POA: Diagnosis not present

## 2022-05-13 DIAGNOSIS — N186 End stage renal disease: Secondary | ICD-10-CM | POA: Diagnosis not present

## 2022-05-15 DIAGNOSIS — D631 Anemia in chronic kidney disease: Secondary | ICD-10-CM | POA: Diagnosis not present

## 2022-05-15 DIAGNOSIS — N186 End stage renal disease: Secondary | ICD-10-CM | POA: Diagnosis not present

## 2022-05-15 DIAGNOSIS — N2581 Secondary hyperparathyroidism of renal origin: Secondary | ICD-10-CM | POA: Diagnosis not present

## 2022-05-15 DIAGNOSIS — Z992 Dependence on renal dialysis: Secondary | ICD-10-CM | POA: Diagnosis not present

## 2022-05-18 DIAGNOSIS — N186 End stage renal disease: Secondary | ICD-10-CM | POA: Diagnosis not present

## 2022-05-18 DIAGNOSIS — N2581 Secondary hyperparathyroidism of renal origin: Secondary | ICD-10-CM | POA: Diagnosis not present

## 2022-05-18 DIAGNOSIS — D631 Anemia in chronic kidney disease: Secondary | ICD-10-CM | POA: Diagnosis not present

## 2022-05-18 DIAGNOSIS — Z992 Dependence on renal dialysis: Secondary | ICD-10-CM | POA: Diagnosis not present

## 2022-05-20 DIAGNOSIS — N2581 Secondary hyperparathyroidism of renal origin: Secondary | ICD-10-CM | POA: Diagnosis not present

## 2022-05-20 DIAGNOSIS — D631 Anemia in chronic kidney disease: Secondary | ICD-10-CM | POA: Diagnosis not present

## 2022-05-20 DIAGNOSIS — N186 End stage renal disease: Secondary | ICD-10-CM | POA: Diagnosis not present

## 2022-05-20 DIAGNOSIS — Z992 Dependence on renal dialysis: Secondary | ICD-10-CM | POA: Diagnosis not present

## 2022-05-22 DIAGNOSIS — N2581 Secondary hyperparathyroidism of renal origin: Secondary | ICD-10-CM | POA: Diagnosis not present

## 2022-05-22 DIAGNOSIS — Z992 Dependence on renal dialysis: Secondary | ICD-10-CM | POA: Diagnosis not present

## 2022-05-22 DIAGNOSIS — D631 Anemia in chronic kidney disease: Secondary | ICD-10-CM | POA: Diagnosis not present

## 2022-05-22 DIAGNOSIS — N186 End stage renal disease: Secondary | ICD-10-CM | POA: Diagnosis not present

## 2022-05-25 DIAGNOSIS — Z992 Dependence on renal dialysis: Secondary | ICD-10-CM | POA: Diagnosis not present

## 2022-05-25 DIAGNOSIS — N186 End stage renal disease: Secondary | ICD-10-CM | POA: Diagnosis not present

## 2022-05-25 DIAGNOSIS — N2581 Secondary hyperparathyroidism of renal origin: Secondary | ICD-10-CM | POA: Diagnosis not present

## 2022-05-25 DIAGNOSIS — D631 Anemia in chronic kidney disease: Secondary | ICD-10-CM | POA: Diagnosis not present

## 2022-05-27 DIAGNOSIS — N2581 Secondary hyperparathyroidism of renal origin: Secondary | ICD-10-CM | POA: Diagnosis not present

## 2022-05-27 DIAGNOSIS — N186 End stage renal disease: Secondary | ICD-10-CM | POA: Diagnosis not present

## 2022-05-27 DIAGNOSIS — Z992 Dependence on renal dialysis: Secondary | ICD-10-CM | POA: Diagnosis not present

## 2022-05-27 DIAGNOSIS — D631 Anemia in chronic kidney disease: Secondary | ICD-10-CM | POA: Diagnosis not present

## 2022-05-29 DIAGNOSIS — Z992 Dependence on renal dialysis: Secondary | ICD-10-CM | POA: Diagnosis not present

## 2022-05-29 DIAGNOSIS — N2581 Secondary hyperparathyroidism of renal origin: Secondary | ICD-10-CM | POA: Diagnosis not present

## 2022-05-29 DIAGNOSIS — N186 End stage renal disease: Secondary | ICD-10-CM | POA: Diagnosis not present

## 2022-05-29 DIAGNOSIS — D631 Anemia in chronic kidney disease: Secondary | ICD-10-CM | POA: Diagnosis not present

## 2022-06-01 DIAGNOSIS — D631 Anemia in chronic kidney disease: Secondary | ICD-10-CM | POA: Diagnosis not present

## 2022-06-01 DIAGNOSIS — N186 End stage renal disease: Secondary | ICD-10-CM | POA: Diagnosis not present

## 2022-06-01 DIAGNOSIS — Z992 Dependence on renal dialysis: Secondary | ICD-10-CM | POA: Diagnosis not present

## 2022-06-01 DIAGNOSIS — N2581 Secondary hyperparathyroidism of renal origin: Secondary | ICD-10-CM | POA: Diagnosis not present

## 2022-06-03 DIAGNOSIS — N2581 Secondary hyperparathyroidism of renal origin: Secondary | ICD-10-CM | POA: Diagnosis not present

## 2022-06-03 DIAGNOSIS — Z992 Dependence on renal dialysis: Secondary | ICD-10-CM | POA: Diagnosis not present

## 2022-06-03 DIAGNOSIS — N186 End stage renal disease: Secondary | ICD-10-CM | POA: Diagnosis not present

## 2022-06-03 DIAGNOSIS — D631 Anemia in chronic kidney disease: Secondary | ICD-10-CM | POA: Diagnosis not present

## 2022-06-05 DIAGNOSIS — N2581 Secondary hyperparathyroidism of renal origin: Secondary | ICD-10-CM | POA: Diagnosis not present

## 2022-06-05 DIAGNOSIS — C649 Malignant neoplasm of unspecified kidney, except renal pelvis: Secondary | ICD-10-CM | POA: Diagnosis not present

## 2022-06-05 DIAGNOSIS — N186 End stage renal disease: Secondary | ICD-10-CM | POA: Diagnosis not present

## 2022-06-05 DIAGNOSIS — K769 Liver disease, unspecified: Secondary | ICD-10-CM | POA: Diagnosis not present

## 2022-06-05 DIAGNOSIS — D631 Anemia in chronic kidney disease: Secondary | ICD-10-CM | POA: Diagnosis not present

## 2022-06-05 DIAGNOSIS — Z992 Dependence on renal dialysis: Secondary | ICD-10-CM | POA: Diagnosis not present

## 2022-06-07 DIAGNOSIS — N183 Chronic kidney disease, stage 3 unspecified: Secondary | ICD-10-CM | POA: Diagnosis not present

## 2022-06-07 DIAGNOSIS — C651 Malignant neoplasm of right renal pelvis: Secondary | ICD-10-CM | POA: Diagnosis not present

## 2022-06-07 DIAGNOSIS — Z79899 Other long term (current) drug therapy: Secondary | ICD-10-CM | POA: Diagnosis not present

## 2022-06-07 DIAGNOSIS — K625 Hemorrhage of anus and rectum: Secondary | ICD-10-CM | POA: Diagnosis not present

## 2022-06-07 DIAGNOSIS — I120 Hypertensive chronic kidney disease with stage 5 chronic kidney disease or end stage renal disease: Secondary | ICD-10-CM | POA: Diagnosis not present

## 2022-06-07 DIAGNOSIS — E1122 Type 2 diabetes mellitus with diabetic chronic kidney disease: Secondary | ICD-10-CM | POA: Diagnosis not present

## 2022-06-07 DIAGNOSIS — N186 End stage renal disease: Secondary | ICD-10-CM | POA: Diagnosis not present

## 2022-06-07 DIAGNOSIS — Z905 Acquired absence of kidney: Secondary | ICD-10-CM | POA: Diagnosis not present

## 2022-06-07 DIAGNOSIS — E785 Hyperlipidemia, unspecified: Secondary | ICD-10-CM | POA: Diagnosis not present

## 2022-06-07 DIAGNOSIS — Z992 Dependence on renal dialysis: Secondary | ICD-10-CM | POA: Diagnosis not present

## 2022-06-07 DIAGNOSIS — Z7901 Long term (current) use of anticoagulants: Secondary | ICD-10-CM | POA: Diagnosis not present

## 2022-06-07 DIAGNOSIS — Z794 Long term (current) use of insulin: Secondary | ICD-10-CM | POA: Diagnosis not present

## 2022-06-08 DIAGNOSIS — Z992 Dependence on renal dialysis: Secondary | ICD-10-CM | POA: Diagnosis not present

## 2022-06-08 DIAGNOSIS — K769 Liver disease, unspecified: Secondary | ICD-10-CM | POA: Diagnosis not present

## 2022-06-08 DIAGNOSIS — N2581 Secondary hyperparathyroidism of renal origin: Secondary | ICD-10-CM | POA: Diagnosis not present

## 2022-06-08 DIAGNOSIS — N186 End stage renal disease: Secondary | ICD-10-CM | POA: Diagnosis not present

## 2022-06-08 DIAGNOSIS — D631 Anemia in chronic kidney disease: Secondary | ICD-10-CM | POA: Diagnosis not present

## 2022-06-10 DIAGNOSIS — Z992 Dependence on renal dialysis: Secondary | ICD-10-CM | POA: Diagnosis not present

## 2022-06-10 DIAGNOSIS — K769 Liver disease, unspecified: Secondary | ICD-10-CM | POA: Diagnosis not present

## 2022-06-10 DIAGNOSIS — N186 End stage renal disease: Secondary | ICD-10-CM | POA: Diagnosis not present

## 2022-06-10 DIAGNOSIS — D631 Anemia in chronic kidney disease: Secondary | ICD-10-CM | POA: Diagnosis not present

## 2022-06-10 DIAGNOSIS — N2581 Secondary hyperparathyroidism of renal origin: Secondary | ICD-10-CM | POA: Diagnosis not present

## 2022-06-12 DIAGNOSIS — N186 End stage renal disease: Secondary | ICD-10-CM | POA: Diagnosis not present

## 2022-06-12 DIAGNOSIS — N2581 Secondary hyperparathyroidism of renal origin: Secondary | ICD-10-CM | POA: Diagnosis not present

## 2022-06-12 DIAGNOSIS — D631 Anemia in chronic kidney disease: Secondary | ICD-10-CM | POA: Diagnosis not present

## 2022-06-12 DIAGNOSIS — Z992 Dependence on renal dialysis: Secondary | ICD-10-CM | POA: Diagnosis not present

## 2022-06-12 DIAGNOSIS — K769 Liver disease, unspecified: Secondary | ICD-10-CM | POA: Diagnosis not present

## 2022-06-14 DIAGNOSIS — Z992 Dependence on renal dialysis: Secondary | ICD-10-CM | POA: Diagnosis not present

## 2022-06-14 DIAGNOSIS — D631 Anemia in chronic kidney disease: Secondary | ICD-10-CM | POA: Diagnosis not present

## 2022-06-14 DIAGNOSIS — I120 Hypertensive chronic kidney disease with stage 5 chronic kidney disease or end stage renal disease: Secondary | ICD-10-CM | POA: Diagnosis not present

## 2022-06-14 DIAGNOSIS — E1122 Type 2 diabetes mellitus with diabetic chronic kidney disease: Secondary | ICD-10-CM | POA: Diagnosis not present

## 2022-06-14 DIAGNOSIS — I771 Stricture of artery: Secondary | ICD-10-CM | POA: Diagnosis not present

## 2022-06-14 DIAGNOSIS — Z794 Long term (current) use of insulin: Secondary | ICD-10-CM | POA: Diagnosis not present

## 2022-06-14 DIAGNOSIS — T82858A Stenosis of vascular prosthetic devices, implants and grafts, initial encounter: Secondary | ICD-10-CM | POA: Diagnosis not present

## 2022-06-14 DIAGNOSIS — T82898A Other specified complication of vascular prosthetic devices, implants and grafts, initial encounter: Secondary | ICD-10-CM | POA: Diagnosis not present

## 2022-06-14 DIAGNOSIS — N186 End stage renal disease: Secondary | ICD-10-CM | POA: Diagnosis not present

## 2022-06-15 DIAGNOSIS — N186 End stage renal disease: Secondary | ICD-10-CM | POA: Diagnosis not present

## 2022-06-15 DIAGNOSIS — D631 Anemia in chronic kidney disease: Secondary | ICD-10-CM | POA: Diagnosis not present

## 2022-06-15 DIAGNOSIS — K769 Liver disease, unspecified: Secondary | ICD-10-CM | POA: Diagnosis not present

## 2022-06-15 DIAGNOSIS — Z992 Dependence on renal dialysis: Secondary | ICD-10-CM | POA: Diagnosis not present

## 2022-06-15 DIAGNOSIS — N2581 Secondary hyperparathyroidism of renal origin: Secondary | ICD-10-CM | POA: Diagnosis not present

## 2022-06-17 DIAGNOSIS — N186 End stage renal disease: Secondary | ICD-10-CM | POA: Diagnosis not present

## 2022-06-17 DIAGNOSIS — K769 Liver disease, unspecified: Secondary | ICD-10-CM | POA: Diagnosis not present

## 2022-06-17 DIAGNOSIS — D631 Anemia in chronic kidney disease: Secondary | ICD-10-CM | POA: Diagnosis not present

## 2022-06-17 DIAGNOSIS — N2581 Secondary hyperparathyroidism of renal origin: Secondary | ICD-10-CM | POA: Diagnosis not present

## 2022-06-17 DIAGNOSIS — Z992 Dependence on renal dialysis: Secondary | ICD-10-CM | POA: Diagnosis not present

## 2022-06-19 DIAGNOSIS — Z992 Dependence on renal dialysis: Secondary | ICD-10-CM | POA: Diagnosis not present

## 2022-06-19 DIAGNOSIS — N2581 Secondary hyperparathyroidism of renal origin: Secondary | ICD-10-CM | POA: Diagnosis not present

## 2022-06-19 DIAGNOSIS — N186 End stage renal disease: Secondary | ICD-10-CM | POA: Diagnosis not present

## 2022-06-19 DIAGNOSIS — D631 Anemia in chronic kidney disease: Secondary | ICD-10-CM | POA: Diagnosis not present

## 2022-06-19 DIAGNOSIS — K769 Liver disease, unspecified: Secondary | ICD-10-CM | POA: Diagnosis not present

## 2022-06-22 DIAGNOSIS — K769 Liver disease, unspecified: Secondary | ICD-10-CM | POA: Diagnosis not present

## 2022-06-22 DIAGNOSIS — Z992 Dependence on renal dialysis: Secondary | ICD-10-CM | POA: Diagnosis not present

## 2022-06-22 DIAGNOSIS — D631 Anemia in chronic kidney disease: Secondary | ICD-10-CM | POA: Diagnosis not present

## 2022-06-22 DIAGNOSIS — N2581 Secondary hyperparathyroidism of renal origin: Secondary | ICD-10-CM | POA: Diagnosis not present

## 2022-06-22 DIAGNOSIS — N186 End stage renal disease: Secondary | ICD-10-CM | POA: Diagnosis not present

## 2022-06-24 DIAGNOSIS — K769 Liver disease, unspecified: Secondary | ICD-10-CM | POA: Diagnosis not present

## 2022-06-24 DIAGNOSIS — N186 End stage renal disease: Secondary | ICD-10-CM | POA: Diagnosis not present

## 2022-06-24 DIAGNOSIS — D631 Anemia in chronic kidney disease: Secondary | ICD-10-CM | POA: Diagnosis not present

## 2022-06-24 DIAGNOSIS — Z992 Dependence on renal dialysis: Secondary | ICD-10-CM | POA: Diagnosis not present

## 2022-06-24 DIAGNOSIS — N2581 Secondary hyperparathyroidism of renal origin: Secondary | ICD-10-CM | POA: Diagnosis not present

## 2022-06-26 DIAGNOSIS — Z992 Dependence on renal dialysis: Secondary | ICD-10-CM | POA: Diagnosis not present

## 2022-06-26 DIAGNOSIS — K769 Liver disease, unspecified: Secondary | ICD-10-CM | POA: Diagnosis not present

## 2022-06-26 DIAGNOSIS — N186 End stage renal disease: Secondary | ICD-10-CM | POA: Diagnosis not present

## 2022-06-26 DIAGNOSIS — N2581 Secondary hyperparathyroidism of renal origin: Secondary | ICD-10-CM | POA: Diagnosis not present

## 2022-06-26 DIAGNOSIS — D631 Anemia in chronic kidney disease: Secondary | ICD-10-CM | POA: Diagnosis not present

## 2022-06-29 DIAGNOSIS — K769 Liver disease, unspecified: Secondary | ICD-10-CM | POA: Diagnosis not present

## 2022-06-29 DIAGNOSIS — D631 Anemia in chronic kidney disease: Secondary | ICD-10-CM | POA: Diagnosis not present

## 2022-06-29 DIAGNOSIS — Z992 Dependence on renal dialysis: Secondary | ICD-10-CM | POA: Diagnosis not present

## 2022-06-29 DIAGNOSIS — N186 End stage renal disease: Secondary | ICD-10-CM | POA: Diagnosis not present

## 2022-06-29 DIAGNOSIS — N2581 Secondary hyperparathyroidism of renal origin: Secondary | ICD-10-CM | POA: Diagnosis not present

## 2022-07-01 DIAGNOSIS — D631 Anemia in chronic kidney disease: Secondary | ICD-10-CM | POA: Diagnosis not present

## 2022-07-01 DIAGNOSIS — N2581 Secondary hyperparathyroidism of renal origin: Secondary | ICD-10-CM | POA: Diagnosis not present

## 2022-07-01 DIAGNOSIS — Z992 Dependence on renal dialysis: Secondary | ICD-10-CM | POA: Diagnosis not present

## 2022-07-01 DIAGNOSIS — N186 End stage renal disease: Secondary | ICD-10-CM | POA: Diagnosis not present

## 2022-07-01 DIAGNOSIS — K769 Liver disease, unspecified: Secondary | ICD-10-CM | POA: Diagnosis not present

## 2022-07-03 DIAGNOSIS — N2581 Secondary hyperparathyroidism of renal origin: Secondary | ICD-10-CM | POA: Diagnosis not present

## 2022-07-03 DIAGNOSIS — Z992 Dependence on renal dialysis: Secondary | ICD-10-CM | POA: Diagnosis not present

## 2022-07-03 DIAGNOSIS — K769 Liver disease, unspecified: Secondary | ICD-10-CM | POA: Diagnosis not present

## 2022-07-03 DIAGNOSIS — N186 End stage renal disease: Secondary | ICD-10-CM | POA: Diagnosis not present

## 2022-07-03 DIAGNOSIS — D631 Anemia in chronic kidney disease: Secondary | ICD-10-CM | POA: Diagnosis not present

## 2022-07-05 DIAGNOSIS — Z992 Dependence on renal dialysis: Secondary | ICD-10-CM | POA: Diagnosis not present

## 2022-07-05 DIAGNOSIS — C649 Malignant neoplasm of unspecified kidney, except renal pelvis: Secondary | ICD-10-CM | POA: Diagnosis not present

## 2022-07-05 DIAGNOSIS — N186 End stage renal disease: Secondary | ICD-10-CM | POA: Diagnosis not present

## 2022-07-06 DIAGNOSIS — N186 End stage renal disease: Secondary | ICD-10-CM | POA: Diagnosis not present

## 2022-07-06 DIAGNOSIS — D631 Anemia in chronic kidney disease: Secondary | ICD-10-CM | POA: Diagnosis not present

## 2022-07-06 DIAGNOSIS — Z992 Dependence on renal dialysis: Secondary | ICD-10-CM | POA: Diagnosis not present

## 2022-07-06 DIAGNOSIS — N2581 Secondary hyperparathyroidism of renal origin: Secondary | ICD-10-CM | POA: Diagnosis not present

## 2022-07-08 DIAGNOSIS — N186 End stage renal disease: Secondary | ICD-10-CM | POA: Diagnosis not present

## 2022-07-08 DIAGNOSIS — N2581 Secondary hyperparathyroidism of renal origin: Secondary | ICD-10-CM | POA: Diagnosis not present

## 2022-07-08 DIAGNOSIS — Z992 Dependence on renal dialysis: Secondary | ICD-10-CM | POA: Diagnosis not present

## 2022-07-08 DIAGNOSIS — D631 Anemia in chronic kidney disease: Secondary | ICD-10-CM | POA: Diagnosis not present

## 2022-07-10 DIAGNOSIS — N186 End stage renal disease: Secondary | ICD-10-CM | POA: Diagnosis not present

## 2022-07-10 DIAGNOSIS — Z992 Dependence on renal dialysis: Secondary | ICD-10-CM | POA: Diagnosis not present

## 2022-07-10 DIAGNOSIS — D631 Anemia in chronic kidney disease: Secondary | ICD-10-CM | POA: Diagnosis not present

## 2022-07-10 DIAGNOSIS — N2581 Secondary hyperparathyroidism of renal origin: Secondary | ICD-10-CM | POA: Diagnosis not present

## 2022-07-13 DIAGNOSIS — N186 End stage renal disease: Secondary | ICD-10-CM | POA: Diagnosis not present

## 2022-07-13 DIAGNOSIS — Z992 Dependence on renal dialysis: Secondary | ICD-10-CM | POA: Diagnosis not present

## 2022-07-13 DIAGNOSIS — D631 Anemia in chronic kidney disease: Secondary | ICD-10-CM | POA: Diagnosis not present

## 2022-07-13 DIAGNOSIS — N2581 Secondary hyperparathyroidism of renal origin: Secondary | ICD-10-CM | POA: Diagnosis not present

## 2022-07-14 DIAGNOSIS — R062 Wheezing: Secondary | ICD-10-CM | POA: Diagnosis not present

## 2022-07-14 DIAGNOSIS — R911 Solitary pulmonary nodule: Secondary | ICD-10-CM | POA: Diagnosis not present

## 2022-07-14 DIAGNOSIS — N189 Chronic kidney disease, unspecified: Secondary | ICD-10-CM | POA: Diagnosis not present

## 2022-07-14 DIAGNOSIS — J986 Disorders of diaphragm: Secondary | ICD-10-CM | POA: Diagnosis not present

## 2022-07-14 DIAGNOSIS — R059 Cough, unspecified: Secondary | ICD-10-CM | POA: Diagnosis not present

## 2022-07-14 DIAGNOSIS — D649 Anemia, unspecified: Secondary | ICD-10-CM | POA: Diagnosis not present

## 2022-07-14 DIAGNOSIS — R918 Other nonspecific abnormal finding of lung field: Secondary | ICD-10-CM | POA: Diagnosis not present

## 2022-07-14 DIAGNOSIS — R0981 Nasal congestion: Secondary | ICD-10-CM | POA: Diagnosis not present

## 2022-07-14 DIAGNOSIS — J40 Bronchitis, not specified as acute or chronic: Secondary | ICD-10-CM | POA: Diagnosis not present

## 2022-07-14 DIAGNOSIS — Z992 Dependence on renal dialysis: Secondary | ICD-10-CM | POA: Diagnosis not present

## 2022-07-14 DIAGNOSIS — R051 Acute cough: Secondary | ICD-10-CM | POA: Diagnosis not present

## 2022-07-14 DIAGNOSIS — I129 Hypertensive chronic kidney disease with stage 1 through stage 4 chronic kidney disease, or unspecified chronic kidney disease: Secondary | ICD-10-CM | POA: Diagnosis not present

## 2022-07-16 DIAGNOSIS — C641 Malignant neoplasm of right kidney, except renal pelvis: Secondary | ICD-10-CM | POA: Diagnosis not present

## 2022-07-16 DIAGNOSIS — R058 Other specified cough: Secondary | ICD-10-CM | POA: Diagnosis not present

## 2022-07-16 DIAGNOSIS — J40 Bronchitis, not specified as acute or chronic: Secondary | ICD-10-CM | POA: Diagnosis not present

## 2022-07-17 DIAGNOSIS — D631 Anemia in chronic kidney disease: Secondary | ICD-10-CM | POA: Diagnosis not present

## 2022-07-17 DIAGNOSIS — N2581 Secondary hyperparathyroidism of renal origin: Secondary | ICD-10-CM | POA: Diagnosis not present

## 2022-07-17 DIAGNOSIS — Z992 Dependence on renal dialysis: Secondary | ICD-10-CM | POA: Diagnosis not present

## 2022-07-17 DIAGNOSIS — N186 End stage renal disease: Secondary | ICD-10-CM | POA: Diagnosis not present

## 2022-07-20 DIAGNOSIS — N2581 Secondary hyperparathyroidism of renal origin: Secondary | ICD-10-CM | POA: Diagnosis not present

## 2022-07-20 DIAGNOSIS — D631 Anemia in chronic kidney disease: Secondary | ICD-10-CM | POA: Diagnosis not present

## 2022-07-20 DIAGNOSIS — Z992 Dependence on renal dialysis: Secondary | ICD-10-CM | POA: Diagnosis not present

## 2022-07-20 DIAGNOSIS — N186 End stage renal disease: Secondary | ICD-10-CM | POA: Diagnosis not present

## 2022-07-22 DIAGNOSIS — D631 Anemia in chronic kidney disease: Secondary | ICD-10-CM | POA: Diagnosis not present

## 2022-07-22 DIAGNOSIS — Z992 Dependence on renal dialysis: Secondary | ICD-10-CM | POA: Diagnosis not present

## 2022-07-22 DIAGNOSIS — N186 End stage renal disease: Secondary | ICD-10-CM | POA: Diagnosis not present

## 2022-07-22 DIAGNOSIS — N2581 Secondary hyperparathyroidism of renal origin: Secondary | ICD-10-CM | POA: Diagnosis not present

## 2022-07-24 DIAGNOSIS — D631 Anemia in chronic kidney disease: Secondary | ICD-10-CM | POA: Diagnosis not present

## 2022-07-24 DIAGNOSIS — N186 End stage renal disease: Secondary | ICD-10-CM | POA: Diagnosis not present

## 2022-07-24 DIAGNOSIS — N2581 Secondary hyperparathyroidism of renal origin: Secondary | ICD-10-CM | POA: Diagnosis not present

## 2022-07-24 DIAGNOSIS — Z992 Dependence on renal dialysis: Secondary | ICD-10-CM | POA: Diagnosis not present

## 2022-07-27 DIAGNOSIS — Z992 Dependence on renal dialysis: Secondary | ICD-10-CM | POA: Diagnosis not present

## 2022-07-27 DIAGNOSIS — D631 Anemia in chronic kidney disease: Secondary | ICD-10-CM | POA: Diagnosis not present

## 2022-07-27 DIAGNOSIS — N2581 Secondary hyperparathyroidism of renal origin: Secondary | ICD-10-CM | POA: Diagnosis not present

## 2022-07-27 DIAGNOSIS — N186 End stage renal disease: Secondary | ICD-10-CM | POA: Diagnosis not present

## 2022-07-29 DIAGNOSIS — Z992 Dependence on renal dialysis: Secondary | ICD-10-CM | POA: Diagnosis not present

## 2022-07-29 DIAGNOSIS — N186 End stage renal disease: Secondary | ICD-10-CM | POA: Diagnosis not present

## 2022-07-29 DIAGNOSIS — D631 Anemia in chronic kidney disease: Secondary | ICD-10-CM | POA: Diagnosis not present

## 2022-07-29 DIAGNOSIS — N2581 Secondary hyperparathyroidism of renal origin: Secondary | ICD-10-CM | POA: Diagnosis not present

## 2022-07-31 DIAGNOSIS — N2581 Secondary hyperparathyroidism of renal origin: Secondary | ICD-10-CM | POA: Diagnosis not present

## 2022-07-31 DIAGNOSIS — D631 Anemia in chronic kidney disease: Secondary | ICD-10-CM | POA: Diagnosis not present

## 2022-07-31 DIAGNOSIS — Z992 Dependence on renal dialysis: Secondary | ICD-10-CM | POA: Diagnosis not present

## 2022-07-31 DIAGNOSIS — N186 End stage renal disease: Secondary | ICD-10-CM | POA: Diagnosis not present

## 2022-08-03 DIAGNOSIS — N2581 Secondary hyperparathyroidism of renal origin: Secondary | ICD-10-CM | POA: Diagnosis not present

## 2022-08-03 DIAGNOSIS — N186 End stage renal disease: Secondary | ICD-10-CM | POA: Diagnosis not present

## 2022-08-03 DIAGNOSIS — D631 Anemia in chronic kidney disease: Secondary | ICD-10-CM | POA: Diagnosis not present

## 2022-08-03 DIAGNOSIS — Z992 Dependence on renal dialysis: Secondary | ICD-10-CM | POA: Diagnosis not present

## 2022-08-05 DIAGNOSIS — D509 Iron deficiency anemia, unspecified: Secondary | ICD-10-CM | POA: Diagnosis not present

## 2022-08-05 DIAGNOSIS — C649 Malignant neoplasm of unspecified kidney, except renal pelvis: Secondary | ICD-10-CM | POA: Diagnosis not present

## 2022-08-05 DIAGNOSIS — D631 Anemia in chronic kidney disease: Secondary | ICD-10-CM | POA: Diagnosis not present

## 2022-08-05 DIAGNOSIS — N186 End stage renal disease: Secondary | ICD-10-CM | POA: Diagnosis not present

## 2022-08-05 DIAGNOSIS — N2581 Secondary hyperparathyroidism of renal origin: Secondary | ICD-10-CM | POA: Diagnosis not present

## 2022-08-05 DIAGNOSIS — Z992 Dependence on renal dialysis: Secondary | ICD-10-CM | POA: Diagnosis not present

## 2022-08-07 DIAGNOSIS — N186 End stage renal disease: Secondary | ICD-10-CM | POA: Diagnosis not present

## 2022-08-07 DIAGNOSIS — D631 Anemia in chronic kidney disease: Secondary | ICD-10-CM | POA: Diagnosis not present

## 2022-08-07 DIAGNOSIS — D509 Iron deficiency anemia, unspecified: Secondary | ICD-10-CM | POA: Diagnosis not present

## 2022-08-07 DIAGNOSIS — Z992 Dependence on renal dialysis: Secondary | ICD-10-CM | POA: Diagnosis not present

## 2022-08-07 DIAGNOSIS — N2581 Secondary hyperparathyroidism of renal origin: Secondary | ICD-10-CM | POA: Diagnosis not present

## 2022-08-09 DIAGNOSIS — N186 End stage renal disease: Secondary | ICD-10-CM | POA: Diagnosis not present

## 2022-08-09 DIAGNOSIS — Z7901 Long term (current) use of anticoagulants: Secondary | ICD-10-CM | POA: Diagnosis not present

## 2022-08-09 DIAGNOSIS — K5900 Constipation, unspecified: Secondary | ICD-10-CM | POA: Diagnosis not present

## 2022-08-09 DIAGNOSIS — I82220 Acute embolism and thrombosis of inferior vena cava: Secondary | ICD-10-CM | POA: Diagnosis not present

## 2022-08-09 DIAGNOSIS — K625 Hemorrhage of anus and rectum: Secondary | ICD-10-CM | POA: Diagnosis not present

## 2022-08-09 DIAGNOSIS — D689 Coagulation defect, unspecified: Secondary | ICD-10-CM | POA: Diagnosis not present

## 2022-08-10 DIAGNOSIS — D509 Iron deficiency anemia, unspecified: Secondary | ICD-10-CM | POA: Diagnosis not present

## 2022-08-10 DIAGNOSIS — Z992 Dependence on renal dialysis: Secondary | ICD-10-CM | POA: Diagnosis not present

## 2022-08-10 DIAGNOSIS — D631 Anemia in chronic kidney disease: Secondary | ICD-10-CM | POA: Diagnosis not present

## 2022-08-10 DIAGNOSIS — N186 End stage renal disease: Secondary | ICD-10-CM | POA: Diagnosis not present

## 2022-08-10 DIAGNOSIS — N2581 Secondary hyperparathyroidism of renal origin: Secondary | ICD-10-CM | POA: Diagnosis not present

## 2022-08-12 DIAGNOSIS — Z992 Dependence on renal dialysis: Secondary | ICD-10-CM | POA: Diagnosis not present

## 2022-08-12 DIAGNOSIS — N186 End stage renal disease: Secondary | ICD-10-CM | POA: Diagnosis not present

## 2022-08-12 DIAGNOSIS — D509 Iron deficiency anemia, unspecified: Secondary | ICD-10-CM | POA: Diagnosis not present

## 2022-08-12 DIAGNOSIS — D631 Anemia in chronic kidney disease: Secondary | ICD-10-CM | POA: Diagnosis not present

## 2022-08-12 DIAGNOSIS — N2581 Secondary hyperparathyroidism of renal origin: Secondary | ICD-10-CM | POA: Diagnosis not present

## 2022-08-14 DIAGNOSIS — D631 Anemia in chronic kidney disease: Secondary | ICD-10-CM | POA: Diagnosis not present

## 2022-08-14 DIAGNOSIS — D509 Iron deficiency anemia, unspecified: Secondary | ICD-10-CM | POA: Diagnosis not present

## 2022-08-14 DIAGNOSIS — N2581 Secondary hyperparathyroidism of renal origin: Secondary | ICD-10-CM | POA: Diagnosis not present

## 2022-08-14 DIAGNOSIS — Z992 Dependence on renal dialysis: Secondary | ICD-10-CM | POA: Diagnosis not present

## 2022-08-14 DIAGNOSIS — N186 End stage renal disease: Secondary | ICD-10-CM | POA: Diagnosis not present

## 2022-08-17 DIAGNOSIS — D509 Iron deficiency anemia, unspecified: Secondary | ICD-10-CM | POA: Diagnosis not present

## 2022-08-17 DIAGNOSIS — N186 End stage renal disease: Secondary | ICD-10-CM | POA: Diagnosis not present

## 2022-08-17 DIAGNOSIS — N2581 Secondary hyperparathyroidism of renal origin: Secondary | ICD-10-CM | POA: Diagnosis not present

## 2022-08-17 DIAGNOSIS — D631 Anemia in chronic kidney disease: Secondary | ICD-10-CM | POA: Diagnosis not present

## 2022-08-17 DIAGNOSIS — Z992 Dependence on renal dialysis: Secondary | ICD-10-CM | POA: Diagnosis not present

## 2022-08-19 DIAGNOSIS — D509 Iron deficiency anemia, unspecified: Secondary | ICD-10-CM | POA: Diagnosis not present

## 2022-08-19 DIAGNOSIS — N2581 Secondary hyperparathyroidism of renal origin: Secondary | ICD-10-CM | POA: Diagnosis not present

## 2022-08-19 DIAGNOSIS — Z992 Dependence on renal dialysis: Secondary | ICD-10-CM | POA: Diagnosis not present

## 2022-08-19 DIAGNOSIS — N186 End stage renal disease: Secondary | ICD-10-CM | POA: Diagnosis not present

## 2022-08-19 DIAGNOSIS — D631 Anemia in chronic kidney disease: Secondary | ICD-10-CM | POA: Diagnosis not present

## 2022-08-21 DIAGNOSIS — D631 Anemia in chronic kidney disease: Secondary | ICD-10-CM | POA: Diagnosis not present

## 2022-08-21 DIAGNOSIS — Z992 Dependence on renal dialysis: Secondary | ICD-10-CM | POA: Diagnosis not present

## 2022-08-21 DIAGNOSIS — N186 End stage renal disease: Secondary | ICD-10-CM | POA: Diagnosis not present

## 2022-08-21 DIAGNOSIS — D509 Iron deficiency anemia, unspecified: Secondary | ICD-10-CM | POA: Diagnosis not present

## 2022-08-21 DIAGNOSIS — N2581 Secondary hyperparathyroidism of renal origin: Secondary | ICD-10-CM | POA: Diagnosis not present

## 2022-08-24 DIAGNOSIS — N186 End stage renal disease: Secondary | ICD-10-CM | POA: Diagnosis not present

## 2022-08-24 DIAGNOSIS — Z992 Dependence on renal dialysis: Secondary | ICD-10-CM | POA: Diagnosis not present

## 2022-08-24 DIAGNOSIS — N2581 Secondary hyperparathyroidism of renal origin: Secondary | ICD-10-CM | POA: Diagnosis not present

## 2022-08-24 DIAGNOSIS — D509 Iron deficiency anemia, unspecified: Secondary | ICD-10-CM | POA: Diagnosis not present

## 2022-08-24 DIAGNOSIS — D631 Anemia in chronic kidney disease: Secondary | ICD-10-CM | POA: Diagnosis not present

## 2022-08-26 DIAGNOSIS — Z992 Dependence on renal dialysis: Secondary | ICD-10-CM | POA: Diagnosis not present

## 2022-08-26 DIAGNOSIS — N186 End stage renal disease: Secondary | ICD-10-CM | POA: Diagnosis not present

## 2022-08-26 DIAGNOSIS — D631 Anemia in chronic kidney disease: Secondary | ICD-10-CM | POA: Diagnosis not present

## 2022-08-26 DIAGNOSIS — N2581 Secondary hyperparathyroidism of renal origin: Secondary | ICD-10-CM | POA: Diagnosis not present

## 2022-08-26 DIAGNOSIS — D509 Iron deficiency anemia, unspecified: Secondary | ICD-10-CM | POA: Diagnosis not present

## 2022-08-28 DIAGNOSIS — D509 Iron deficiency anemia, unspecified: Secondary | ICD-10-CM | POA: Diagnosis not present

## 2022-08-28 DIAGNOSIS — N186 End stage renal disease: Secondary | ICD-10-CM | POA: Diagnosis not present

## 2022-08-28 DIAGNOSIS — D631 Anemia in chronic kidney disease: Secondary | ICD-10-CM | POA: Diagnosis not present

## 2022-08-28 DIAGNOSIS — N2581 Secondary hyperparathyroidism of renal origin: Secondary | ICD-10-CM | POA: Diagnosis not present

## 2022-08-28 DIAGNOSIS — Z992 Dependence on renal dialysis: Secondary | ICD-10-CM | POA: Diagnosis not present

## 2022-08-31 DIAGNOSIS — N186 End stage renal disease: Secondary | ICD-10-CM | POA: Diagnosis not present

## 2022-08-31 DIAGNOSIS — N2581 Secondary hyperparathyroidism of renal origin: Secondary | ICD-10-CM | POA: Diagnosis not present

## 2022-08-31 DIAGNOSIS — D631 Anemia in chronic kidney disease: Secondary | ICD-10-CM | POA: Diagnosis not present

## 2022-08-31 DIAGNOSIS — Z992 Dependence on renal dialysis: Secondary | ICD-10-CM | POA: Diagnosis not present

## 2022-08-31 DIAGNOSIS — D509 Iron deficiency anemia, unspecified: Secondary | ICD-10-CM | POA: Diagnosis not present

## 2022-09-02 DIAGNOSIS — D631 Anemia in chronic kidney disease: Secondary | ICD-10-CM | POA: Diagnosis not present

## 2022-09-02 DIAGNOSIS — N186 End stage renal disease: Secondary | ICD-10-CM | POA: Diagnosis not present

## 2022-09-02 DIAGNOSIS — D509 Iron deficiency anemia, unspecified: Secondary | ICD-10-CM | POA: Diagnosis not present

## 2022-09-02 DIAGNOSIS — N2581 Secondary hyperparathyroidism of renal origin: Secondary | ICD-10-CM | POA: Diagnosis not present

## 2022-09-02 DIAGNOSIS — Z992 Dependence on renal dialysis: Secondary | ICD-10-CM | POA: Diagnosis not present

## 2022-09-04 DIAGNOSIS — D631 Anemia in chronic kidney disease: Secondary | ICD-10-CM | POA: Diagnosis not present

## 2022-09-04 DIAGNOSIS — N186 End stage renal disease: Secondary | ICD-10-CM | POA: Diagnosis not present

## 2022-09-04 DIAGNOSIS — Z992 Dependence on renal dialysis: Secondary | ICD-10-CM | POA: Diagnosis not present

## 2022-09-04 DIAGNOSIS — N2581 Secondary hyperparathyroidism of renal origin: Secondary | ICD-10-CM | POA: Diagnosis not present

## 2022-09-04 DIAGNOSIS — D509 Iron deficiency anemia, unspecified: Secondary | ICD-10-CM | POA: Diagnosis not present

## 2022-09-05 DIAGNOSIS — N186 End stage renal disease: Secondary | ICD-10-CM | POA: Diagnosis not present

## 2022-09-05 DIAGNOSIS — C649 Malignant neoplasm of unspecified kidney, except renal pelvis: Secondary | ICD-10-CM | POA: Diagnosis not present

## 2022-09-05 DIAGNOSIS — Z992 Dependence on renal dialysis: Secondary | ICD-10-CM | POA: Diagnosis not present

## 2022-09-07 DIAGNOSIS — K769 Liver disease, unspecified: Secondary | ICD-10-CM | POA: Diagnosis not present

## 2022-09-07 DIAGNOSIS — D509 Iron deficiency anemia, unspecified: Secondary | ICD-10-CM | POA: Diagnosis not present

## 2022-09-07 DIAGNOSIS — N186 End stage renal disease: Secondary | ICD-10-CM | POA: Diagnosis not present

## 2022-09-07 DIAGNOSIS — Z992 Dependence on renal dialysis: Secondary | ICD-10-CM | POA: Diagnosis not present

## 2022-09-07 DIAGNOSIS — D631 Anemia in chronic kidney disease: Secondary | ICD-10-CM | POA: Diagnosis not present

## 2022-09-07 DIAGNOSIS — N2581 Secondary hyperparathyroidism of renal origin: Secondary | ICD-10-CM | POA: Diagnosis not present

## 2022-09-09 DIAGNOSIS — Z992 Dependence on renal dialysis: Secondary | ICD-10-CM | POA: Diagnosis not present

## 2022-09-09 DIAGNOSIS — N186 End stage renal disease: Secondary | ICD-10-CM | POA: Diagnosis not present

## 2022-09-09 DIAGNOSIS — K769 Liver disease, unspecified: Secondary | ICD-10-CM | POA: Diagnosis not present

## 2022-09-09 DIAGNOSIS — D509 Iron deficiency anemia, unspecified: Secondary | ICD-10-CM | POA: Diagnosis not present

## 2022-09-09 DIAGNOSIS — N2581 Secondary hyperparathyroidism of renal origin: Secondary | ICD-10-CM | POA: Diagnosis not present

## 2022-09-09 DIAGNOSIS — D631 Anemia in chronic kidney disease: Secondary | ICD-10-CM | POA: Diagnosis not present

## 2022-09-10 DIAGNOSIS — Z5112 Encounter for antineoplastic immunotherapy: Secondary | ICD-10-CM | POA: Diagnosis not present

## 2022-09-10 DIAGNOSIS — Z23 Encounter for immunization: Secondary | ICD-10-CM | POA: Diagnosis not present

## 2022-09-10 DIAGNOSIS — C641 Malignant neoplasm of right kidney, except renal pelvis: Secondary | ICD-10-CM | POA: Diagnosis not present

## 2022-09-11 DIAGNOSIS — D509 Iron deficiency anemia, unspecified: Secondary | ICD-10-CM | POA: Diagnosis not present

## 2022-09-11 DIAGNOSIS — D631 Anemia in chronic kidney disease: Secondary | ICD-10-CM | POA: Diagnosis not present

## 2022-09-11 DIAGNOSIS — K769 Liver disease, unspecified: Secondary | ICD-10-CM | POA: Diagnosis not present

## 2022-09-11 DIAGNOSIS — Z992 Dependence on renal dialysis: Secondary | ICD-10-CM | POA: Diagnosis not present

## 2022-09-11 DIAGNOSIS — N186 End stage renal disease: Secondary | ICD-10-CM | POA: Diagnosis not present

## 2022-09-11 DIAGNOSIS — N2581 Secondary hyperparathyroidism of renal origin: Secondary | ICD-10-CM | POA: Diagnosis not present

## 2022-09-14 DIAGNOSIS — N186 End stage renal disease: Secondary | ICD-10-CM | POA: Diagnosis not present

## 2022-09-14 DIAGNOSIS — N2581 Secondary hyperparathyroidism of renal origin: Secondary | ICD-10-CM | POA: Diagnosis not present

## 2022-09-14 DIAGNOSIS — Z992 Dependence on renal dialysis: Secondary | ICD-10-CM | POA: Diagnosis not present

## 2022-09-14 DIAGNOSIS — D631 Anemia in chronic kidney disease: Secondary | ICD-10-CM | POA: Diagnosis not present

## 2022-09-14 DIAGNOSIS — K769 Liver disease, unspecified: Secondary | ICD-10-CM | POA: Diagnosis not present

## 2022-09-14 DIAGNOSIS — D509 Iron deficiency anemia, unspecified: Secondary | ICD-10-CM | POA: Diagnosis not present

## 2022-09-16 DIAGNOSIS — D509 Iron deficiency anemia, unspecified: Secondary | ICD-10-CM | POA: Diagnosis not present

## 2022-09-16 DIAGNOSIS — Z992 Dependence on renal dialysis: Secondary | ICD-10-CM | POA: Diagnosis not present

## 2022-09-16 DIAGNOSIS — D631 Anemia in chronic kidney disease: Secondary | ICD-10-CM | POA: Diagnosis not present

## 2022-09-16 DIAGNOSIS — K769 Liver disease, unspecified: Secondary | ICD-10-CM | POA: Diagnosis not present

## 2022-09-16 DIAGNOSIS — N186 End stage renal disease: Secondary | ICD-10-CM | POA: Diagnosis not present

## 2022-09-16 DIAGNOSIS — N2581 Secondary hyperparathyroidism of renal origin: Secondary | ICD-10-CM | POA: Diagnosis not present

## 2022-09-18 DIAGNOSIS — D509 Iron deficiency anemia, unspecified: Secondary | ICD-10-CM | POA: Diagnosis not present

## 2022-09-18 DIAGNOSIS — D631 Anemia in chronic kidney disease: Secondary | ICD-10-CM | POA: Diagnosis not present

## 2022-09-18 DIAGNOSIS — N186 End stage renal disease: Secondary | ICD-10-CM | POA: Diagnosis not present

## 2022-09-18 DIAGNOSIS — K769 Liver disease, unspecified: Secondary | ICD-10-CM | POA: Diagnosis not present

## 2022-09-18 DIAGNOSIS — Z992 Dependence on renal dialysis: Secondary | ICD-10-CM | POA: Diagnosis not present

## 2022-09-18 DIAGNOSIS — N2581 Secondary hyperparathyroidism of renal origin: Secondary | ICD-10-CM | POA: Diagnosis not present

## 2022-09-21 DIAGNOSIS — K769 Liver disease, unspecified: Secondary | ICD-10-CM | POA: Diagnosis not present

## 2022-09-21 DIAGNOSIS — D631 Anemia in chronic kidney disease: Secondary | ICD-10-CM | POA: Diagnosis not present

## 2022-09-21 DIAGNOSIS — D509 Iron deficiency anemia, unspecified: Secondary | ICD-10-CM | POA: Diagnosis not present

## 2022-09-21 DIAGNOSIS — Z992 Dependence on renal dialysis: Secondary | ICD-10-CM | POA: Diagnosis not present

## 2022-09-21 DIAGNOSIS — N2581 Secondary hyperparathyroidism of renal origin: Secondary | ICD-10-CM | POA: Diagnosis not present

## 2022-09-21 DIAGNOSIS — N186 End stage renal disease: Secondary | ICD-10-CM | POA: Diagnosis not present

## 2022-09-23 DIAGNOSIS — D631 Anemia in chronic kidney disease: Secondary | ICD-10-CM | POA: Diagnosis not present

## 2022-09-23 DIAGNOSIS — N2581 Secondary hyperparathyroidism of renal origin: Secondary | ICD-10-CM | POA: Diagnosis not present

## 2022-09-23 DIAGNOSIS — Z992 Dependence on renal dialysis: Secondary | ICD-10-CM | POA: Diagnosis not present

## 2022-09-23 DIAGNOSIS — K769 Liver disease, unspecified: Secondary | ICD-10-CM | POA: Diagnosis not present

## 2022-09-23 DIAGNOSIS — N186 End stage renal disease: Secondary | ICD-10-CM | POA: Diagnosis not present

## 2022-09-23 DIAGNOSIS — D509 Iron deficiency anemia, unspecified: Secondary | ICD-10-CM | POA: Diagnosis not present

## 2022-09-23 IMAGING — US US RENAL
1 series · 14 of 25 positions shown · non-contrast
Comparison: CT from 04/08/2006

CLINICAL DATA: Chronic renal disease with proteinuria

EXAM:
RENAL / URINARY TRACT ULTRASOUND COMPLETE

[Series 1: us renal · 0.26mm/px · 14 of 47 slices shown]
[im 1/47]
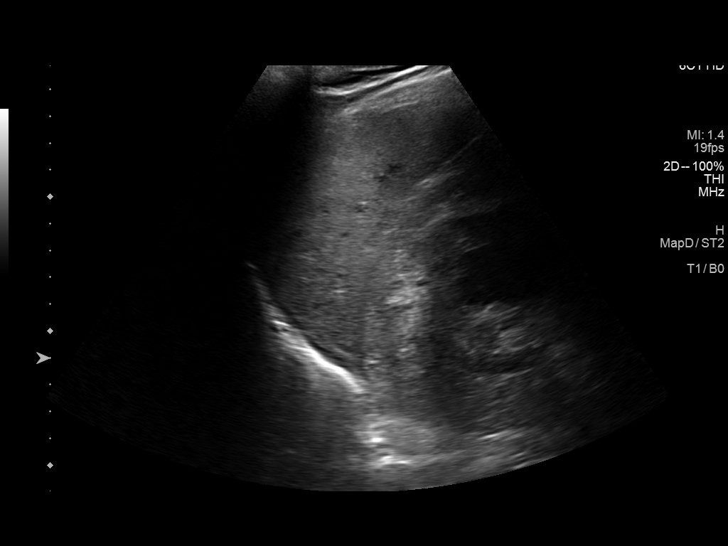
[im 4/47]
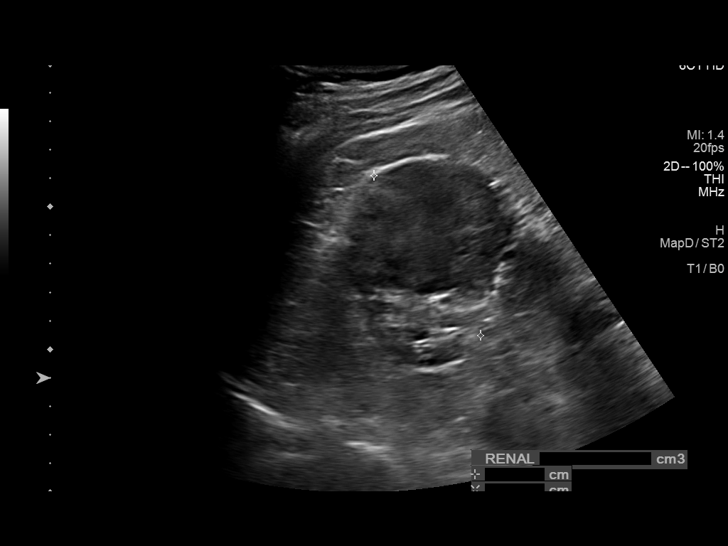
[im 8/47]
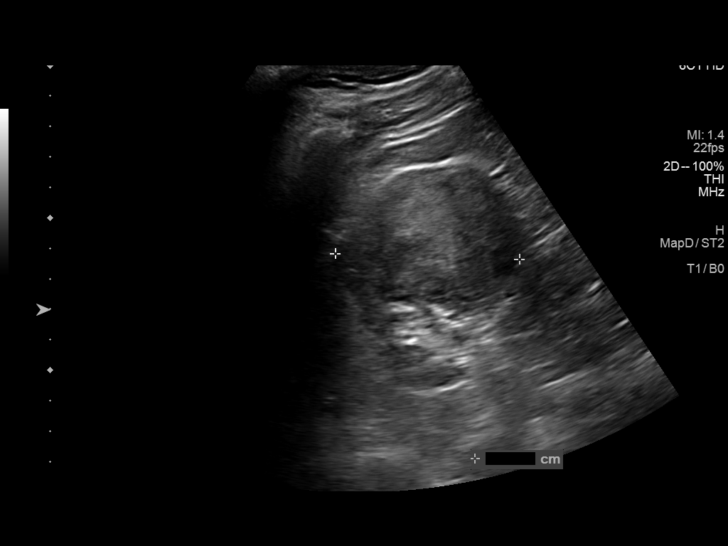
[im 12/47]
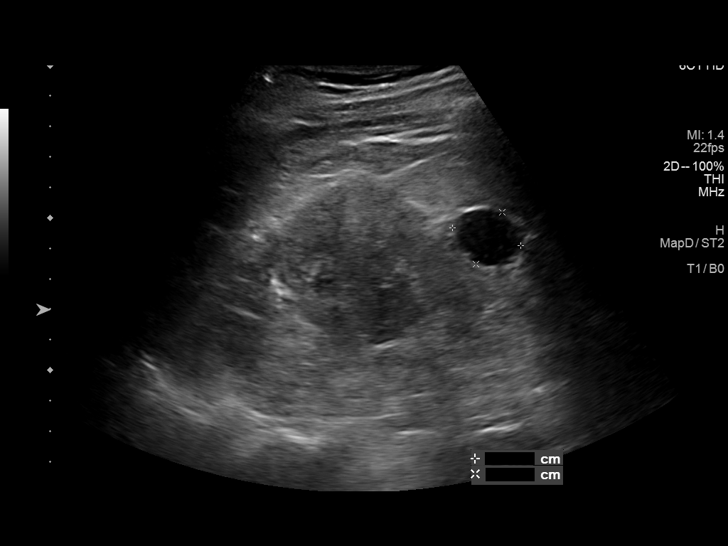
[im 16/47]
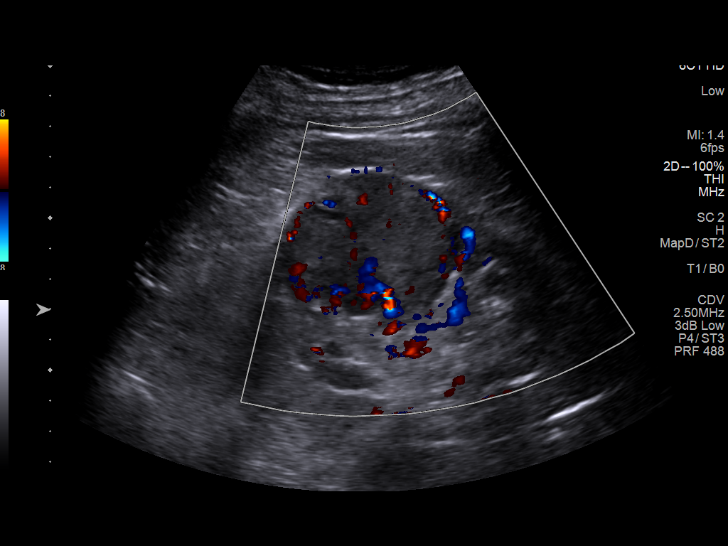
[im 18/47]
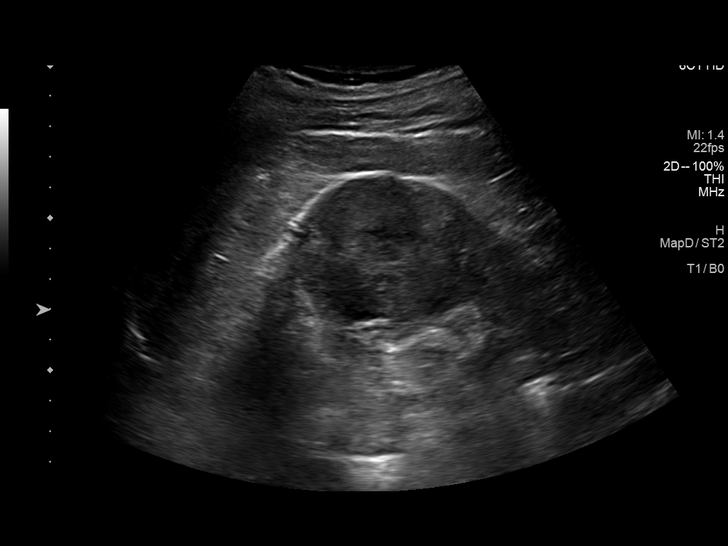
[im 22/47]
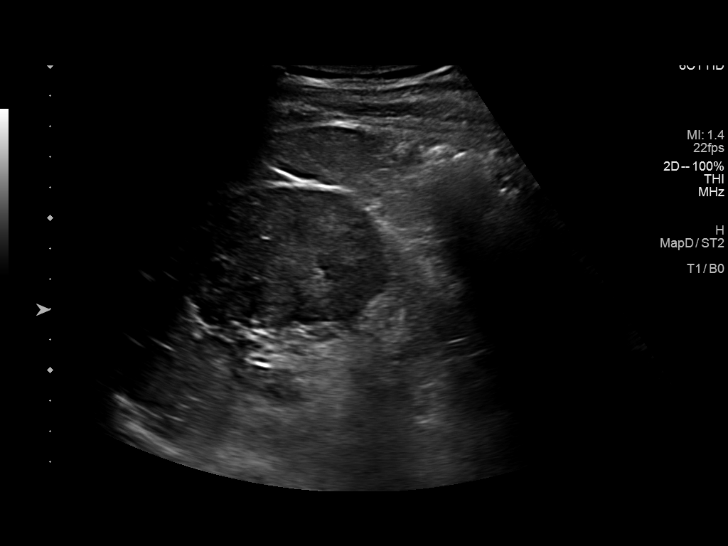
[im 25/47]
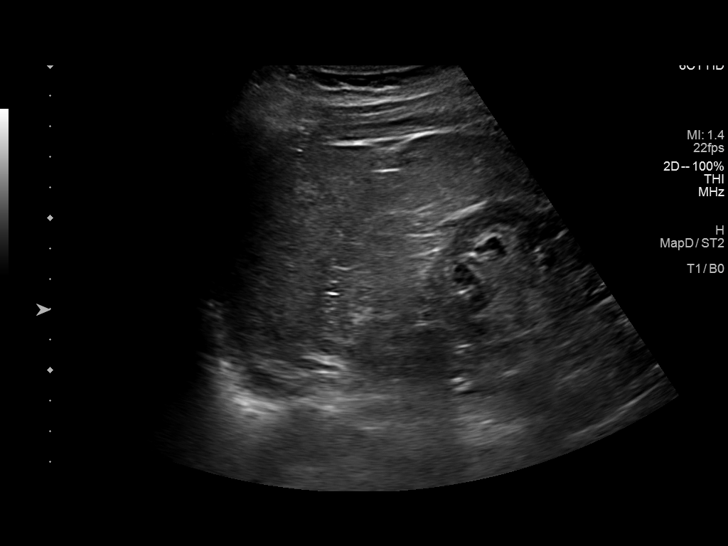
[im 29/47]
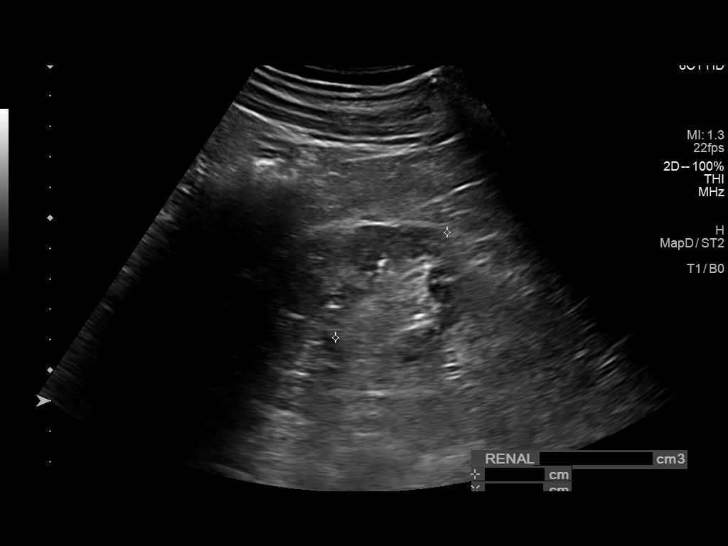
[im 31/47]
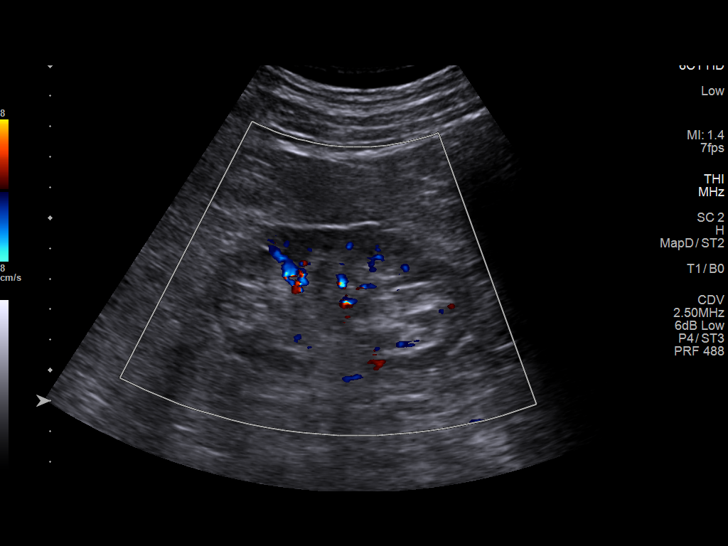
[im 35/47]
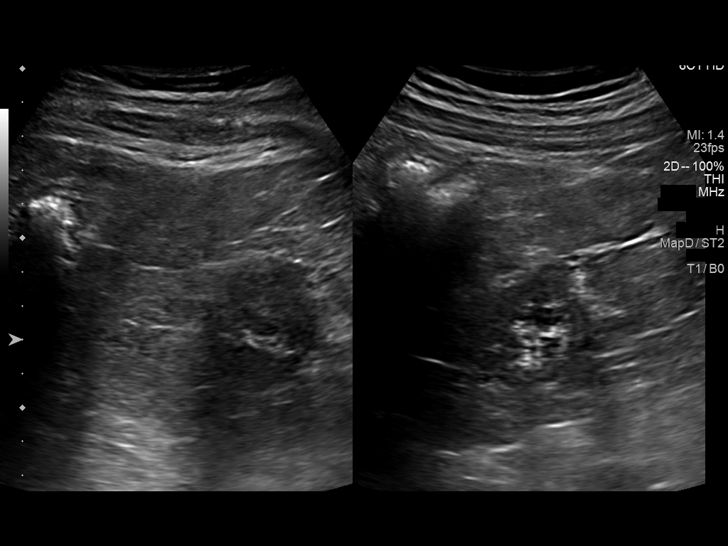
[im 39/47]
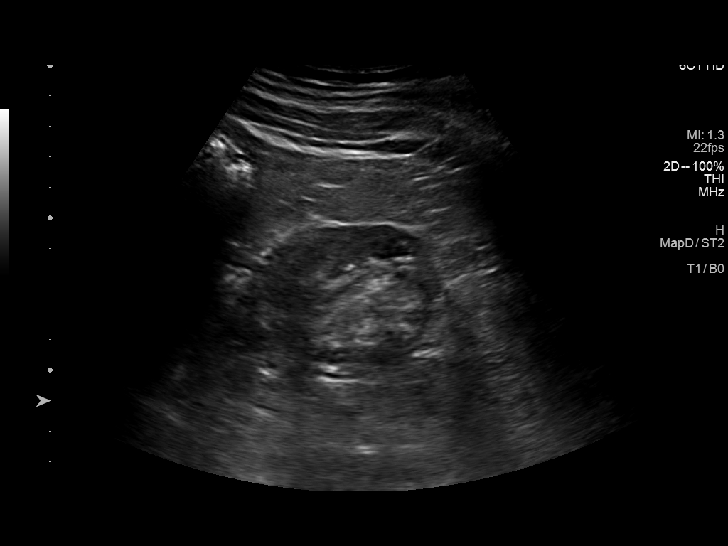
[im 43/47]
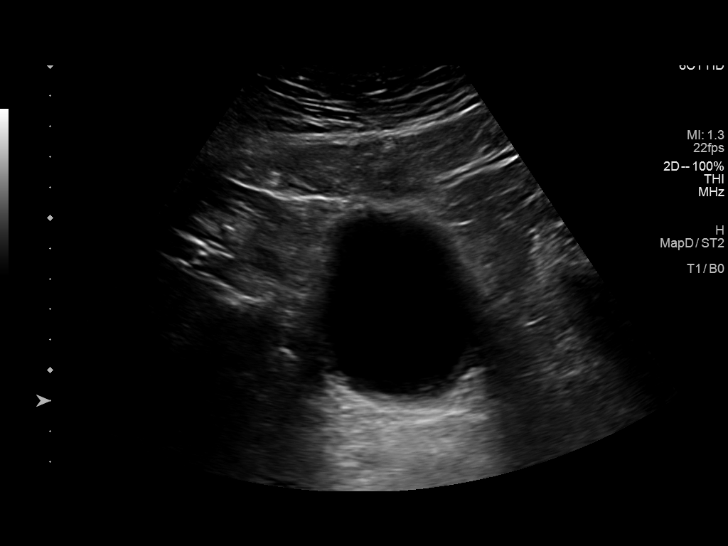
[im 47/47]
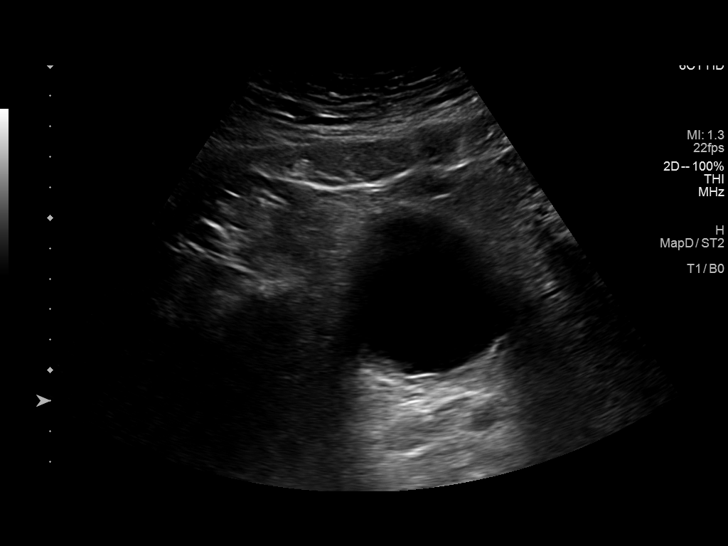

[14 of 25 positions shown; findings below may reference images not displayed]

FINDINGS: Right Kidney:

Renal measurements: 9.8 x 7.1 x 6.7 cm. = volume: 246 mL. No
hydronephrosis is noted. A solid-appearing mass is noted in the mid
to upper pole which measures 6.5 cm in greatest dimension. This was
not seen on the prior CT examination and further evaluation for
underlying neoplasm is recommended. 2.3 cm cyst is noted in the
lower pole the right kidney.

Left Kidney:

Renal measurements: 9.3 x 4.7 x 5.0 cm. = volume: 115 mL. Diffuse
cortical thinning is noted. No mass lesion or hydronephrosis is
seen.

Bladder:

Appears normal for degree of bladder distention.

Other:

None.
IMPRESSION: 6.5 cm solid renal mass within the mid to upper pole of the right
kidney suspicious for underlying neoplasm. Further evaluation by MRI
is recommended.

Right renal cyst.

No obstructive changes are noted.

## 2022-09-25 DIAGNOSIS — N2581 Secondary hyperparathyroidism of renal origin: Secondary | ICD-10-CM | POA: Diagnosis not present

## 2022-09-25 DIAGNOSIS — Z992 Dependence on renal dialysis: Secondary | ICD-10-CM | POA: Diagnosis not present

## 2022-09-25 DIAGNOSIS — D631 Anemia in chronic kidney disease: Secondary | ICD-10-CM | POA: Diagnosis not present

## 2022-09-25 DIAGNOSIS — D509 Iron deficiency anemia, unspecified: Secondary | ICD-10-CM | POA: Diagnosis not present

## 2022-09-25 DIAGNOSIS — N186 End stage renal disease: Secondary | ICD-10-CM | POA: Diagnosis not present

## 2022-09-25 DIAGNOSIS — K769 Liver disease, unspecified: Secondary | ICD-10-CM | POA: Diagnosis not present

## 2022-09-28 DIAGNOSIS — N2581 Secondary hyperparathyroidism of renal origin: Secondary | ICD-10-CM | POA: Diagnosis not present

## 2022-09-28 DIAGNOSIS — Z992 Dependence on renal dialysis: Secondary | ICD-10-CM | POA: Diagnosis not present

## 2022-09-28 DIAGNOSIS — D631 Anemia in chronic kidney disease: Secondary | ICD-10-CM | POA: Diagnosis not present

## 2022-09-28 DIAGNOSIS — D509 Iron deficiency anemia, unspecified: Secondary | ICD-10-CM | POA: Diagnosis not present

## 2022-09-28 DIAGNOSIS — K769 Liver disease, unspecified: Secondary | ICD-10-CM | POA: Diagnosis not present

## 2022-09-28 DIAGNOSIS — N186 End stage renal disease: Secondary | ICD-10-CM | POA: Diagnosis not present

## 2022-09-30 DIAGNOSIS — D509 Iron deficiency anemia, unspecified: Secondary | ICD-10-CM | POA: Diagnosis not present

## 2022-09-30 DIAGNOSIS — N2581 Secondary hyperparathyroidism of renal origin: Secondary | ICD-10-CM | POA: Diagnosis not present

## 2022-09-30 DIAGNOSIS — Z992 Dependence on renal dialysis: Secondary | ICD-10-CM | POA: Diagnosis not present

## 2022-09-30 DIAGNOSIS — K769 Liver disease, unspecified: Secondary | ICD-10-CM | POA: Diagnosis not present

## 2022-09-30 DIAGNOSIS — D631 Anemia in chronic kidney disease: Secondary | ICD-10-CM | POA: Diagnosis not present

## 2022-09-30 DIAGNOSIS — N186 End stage renal disease: Secondary | ICD-10-CM | POA: Diagnosis not present

## 2022-10-05 DIAGNOSIS — N186 End stage renal disease: Secondary | ICD-10-CM | POA: Diagnosis not present

## 2022-10-05 DIAGNOSIS — N2581 Secondary hyperparathyroidism of renal origin: Secondary | ICD-10-CM | POA: Diagnosis not present

## 2022-10-05 DIAGNOSIS — Z23 Encounter for immunization: Secondary | ICD-10-CM | POA: Diagnosis not present

## 2022-10-05 DIAGNOSIS — Z992 Dependence on renal dialysis: Secondary | ICD-10-CM | POA: Diagnosis not present

## 2022-10-05 DIAGNOSIS — D509 Iron deficiency anemia, unspecified: Secondary | ICD-10-CM | POA: Diagnosis not present

## 2022-10-05 DIAGNOSIS — D631 Anemia in chronic kidney disease: Secondary | ICD-10-CM | POA: Diagnosis not present

## 2022-10-05 DIAGNOSIS — C649 Malignant neoplasm of unspecified kidney, except renal pelvis: Secondary | ICD-10-CM | POA: Diagnosis not present

## 2022-10-06 DIAGNOSIS — R062 Wheezing: Secondary | ICD-10-CM | POA: Diagnosis not present

## 2022-10-06 DIAGNOSIS — I69959 Hemiplegia and hemiparesis following unspecified cerebrovascular disease affecting unspecified side: Secondary | ICD-10-CM | POA: Diagnosis not present

## 2022-10-06 DIAGNOSIS — N186 End stage renal disease: Secondary | ICD-10-CM | POA: Diagnosis not present

## 2022-10-06 DIAGNOSIS — Z794 Long term (current) use of insulin: Secondary | ICD-10-CM | POA: Diagnosis not present

## 2022-10-06 DIAGNOSIS — Z992 Dependence on renal dialysis: Secondary | ICD-10-CM | POA: Diagnosis not present

## 2022-10-06 DIAGNOSIS — I120 Hypertensive chronic kidney disease with stage 5 chronic kidney disease or end stage renal disease: Secondary | ICD-10-CM | POA: Diagnosis not present

## 2022-10-06 DIAGNOSIS — Z79899 Other long term (current) drug therapy: Secondary | ICD-10-CM | POA: Diagnosis not present

## 2022-10-06 DIAGNOSIS — E1122 Type 2 diabetes mellitus with diabetic chronic kidney disease: Secondary | ICD-10-CM | POA: Diagnosis not present

## 2022-10-07 DIAGNOSIS — N2581 Secondary hyperparathyroidism of renal origin: Secondary | ICD-10-CM | POA: Diagnosis not present

## 2022-10-07 DIAGNOSIS — D509 Iron deficiency anemia, unspecified: Secondary | ICD-10-CM | POA: Diagnosis not present

## 2022-10-07 DIAGNOSIS — Z992 Dependence on renal dialysis: Secondary | ICD-10-CM | POA: Diagnosis not present

## 2022-10-07 DIAGNOSIS — Z23 Encounter for immunization: Secondary | ICD-10-CM | POA: Diagnosis not present

## 2022-10-07 DIAGNOSIS — N186 End stage renal disease: Secondary | ICD-10-CM | POA: Diagnosis not present

## 2022-10-07 DIAGNOSIS — D631 Anemia in chronic kidney disease: Secondary | ICD-10-CM | POA: Diagnosis not present

## 2022-10-09 DIAGNOSIS — Z992 Dependence on renal dialysis: Secondary | ICD-10-CM | POA: Diagnosis not present

## 2022-10-09 DIAGNOSIS — N186 End stage renal disease: Secondary | ICD-10-CM | POA: Diagnosis not present

## 2022-10-09 DIAGNOSIS — Z23 Encounter for immunization: Secondary | ICD-10-CM | POA: Diagnosis not present

## 2022-10-09 DIAGNOSIS — D509 Iron deficiency anemia, unspecified: Secondary | ICD-10-CM | POA: Diagnosis not present

## 2022-10-09 DIAGNOSIS — D631 Anemia in chronic kidney disease: Secondary | ICD-10-CM | POA: Diagnosis not present

## 2022-10-09 DIAGNOSIS — N2581 Secondary hyperparathyroidism of renal origin: Secondary | ICD-10-CM | POA: Diagnosis not present

## 2022-10-12 DIAGNOSIS — D631 Anemia in chronic kidney disease: Secondary | ICD-10-CM | POA: Diagnosis not present

## 2022-10-12 DIAGNOSIS — N2581 Secondary hyperparathyroidism of renal origin: Secondary | ICD-10-CM | POA: Diagnosis not present

## 2022-10-12 DIAGNOSIS — Z992 Dependence on renal dialysis: Secondary | ICD-10-CM | POA: Diagnosis not present

## 2022-10-12 DIAGNOSIS — Z23 Encounter for immunization: Secondary | ICD-10-CM | POA: Diagnosis not present

## 2022-10-12 DIAGNOSIS — D509 Iron deficiency anemia, unspecified: Secondary | ICD-10-CM | POA: Diagnosis not present

## 2022-10-12 DIAGNOSIS — N186 End stage renal disease: Secondary | ICD-10-CM | POA: Diagnosis not present

## 2022-10-14 DIAGNOSIS — D509 Iron deficiency anemia, unspecified: Secondary | ICD-10-CM | POA: Diagnosis not present

## 2022-10-14 DIAGNOSIS — N2581 Secondary hyperparathyroidism of renal origin: Secondary | ICD-10-CM | POA: Diagnosis not present

## 2022-10-14 DIAGNOSIS — Z23 Encounter for immunization: Secondary | ICD-10-CM | POA: Diagnosis not present

## 2022-10-14 DIAGNOSIS — Z992 Dependence on renal dialysis: Secondary | ICD-10-CM | POA: Diagnosis not present

## 2022-10-14 DIAGNOSIS — N186 End stage renal disease: Secondary | ICD-10-CM | POA: Diagnosis not present

## 2022-10-14 DIAGNOSIS — D631 Anemia in chronic kidney disease: Secondary | ICD-10-CM | POA: Diagnosis not present

## 2022-10-16 DIAGNOSIS — Z23 Encounter for immunization: Secondary | ICD-10-CM | POA: Diagnosis not present

## 2022-10-16 DIAGNOSIS — D509 Iron deficiency anemia, unspecified: Secondary | ICD-10-CM | POA: Diagnosis not present

## 2022-10-16 DIAGNOSIS — D631 Anemia in chronic kidney disease: Secondary | ICD-10-CM | POA: Diagnosis not present

## 2022-10-16 DIAGNOSIS — N186 End stage renal disease: Secondary | ICD-10-CM | POA: Diagnosis not present

## 2022-10-16 DIAGNOSIS — N2581 Secondary hyperparathyroidism of renal origin: Secondary | ICD-10-CM | POA: Diagnosis not present

## 2022-10-16 DIAGNOSIS — Z992 Dependence on renal dialysis: Secondary | ICD-10-CM | POA: Diagnosis not present

## 2022-10-19 DIAGNOSIS — D509 Iron deficiency anemia, unspecified: Secondary | ICD-10-CM | POA: Diagnosis not present

## 2022-10-19 DIAGNOSIS — N2581 Secondary hyperparathyroidism of renal origin: Secondary | ICD-10-CM | POA: Diagnosis not present

## 2022-10-19 DIAGNOSIS — N186 End stage renal disease: Secondary | ICD-10-CM | POA: Diagnosis not present

## 2022-10-19 DIAGNOSIS — Z23 Encounter for immunization: Secondary | ICD-10-CM | POA: Diagnosis not present

## 2022-10-19 DIAGNOSIS — Z992 Dependence on renal dialysis: Secondary | ICD-10-CM | POA: Diagnosis not present

## 2022-10-19 DIAGNOSIS — D631 Anemia in chronic kidney disease: Secondary | ICD-10-CM | POA: Diagnosis not present

## 2022-10-21 DIAGNOSIS — D631 Anemia in chronic kidney disease: Secondary | ICD-10-CM | POA: Diagnosis not present

## 2022-10-21 DIAGNOSIS — N186 End stage renal disease: Secondary | ICD-10-CM | POA: Diagnosis not present

## 2022-10-21 DIAGNOSIS — N2581 Secondary hyperparathyroidism of renal origin: Secondary | ICD-10-CM | POA: Diagnosis not present

## 2022-10-21 DIAGNOSIS — D509 Iron deficiency anemia, unspecified: Secondary | ICD-10-CM | POA: Diagnosis not present

## 2022-10-21 DIAGNOSIS — Z23 Encounter for immunization: Secondary | ICD-10-CM | POA: Diagnosis not present

## 2022-10-21 DIAGNOSIS — Z992 Dependence on renal dialysis: Secondary | ICD-10-CM | POA: Diagnosis not present

## 2022-10-23 DIAGNOSIS — Z23 Encounter for immunization: Secondary | ICD-10-CM | POA: Diagnosis not present

## 2022-10-23 DIAGNOSIS — N2581 Secondary hyperparathyroidism of renal origin: Secondary | ICD-10-CM | POA: Diagnosis not present

## 2022-10-23 DIAGNOSIS — D509 Iron deficiency anemia, unspecified: Secondary | ICD-10-CM | POA: Diagnosis not present

## 2022-10-23 DIAGNOSIS — Z992 Dependence on renal dialysis: Secondary | ICD-10-CM | POA: Diagnosis not present

## 2022-10-23 DIAGNOSIS — D631 Anemia in chronic kidney disease: Secondary | ICD-10-CM | POA: Diagnosis not present

## 2022-10-23 DIAGNOSIS — N186 End stage renal disease: Secondary | ICD-10-CM | POA: Diagnosis not present

## 2022-10-26 DIAGNOSIS — D631 Anemia in chronic kidney disease: Secondary | ICD-10-CM | POA: Diagnosis not present

## 2022-10-26 DIAGNOSIS — Z23 Encounter for immunization: Secondary | ICD-10-CM | POA: Diagnosis not present

## 2022-10-26 DIAGNOSIS — Z992 Dependence on renal dialysis: Secondary | ICD-10-CM | POA: Diagnosis not present

## 2022-10-26 DIAGNOSIS — N2581 Secondary hyperparathyroidism of renal origin: Secondary | ICD-10-CM | POA: Diagnosis not present

## 2022-10-26 DIAGNOSIS — D509 Iron deficiency anemia, unspecified: Secondary | ICD-10-CM | POA: Diagnosis not present

## 2022-10-26 DIAGNOSIS — N186 End stage renal disease: Secondary | ICD-10-CM | POA: Diagnosis not present

## 2022-10-28 DIAGNOSIS — D631 Anemia in chronic kidney disease: Secondary | ICD-10-CM | POA: Diagnosis not present

## 2022-10-28 DIAGNOSIS — N2581 Secondary hyperparathyroidism of renal origin: Secondary | ICD-10-CM | POA: Diagnosis not present

## 2022-10-28 DIAGNOSIS — Z992 Dependence on renal dialysis: Secondary | ICD-10-CM | POA: Diagnosis not present

## 2022-10-28 DIAGNOSIS — N186 End stage renal disease: Secondary | ICD-10-CM | POA: Diagnosis not present

## 2022-10-28 DIAGNOSIS — D509 Iron deficiency anemia, unspecified: Secondary | ICD-10-CM | POA: Diagnosis not present

## 2022-10-28 DIAGNOSIS — Z23 Encounter for immunization: Secondary | ICD-10-CM | POA: Diagnosis not present

## 2022-10-30 DIAGNOSIS — N2581 Secondary hyperparathyroidism of renal origin: Secondary | ICD-10-CM | POA: Diagnosis not present

## 2022-10-30 DIAGNOSIS — Z23 Encounter for immunization: Secondary | ICD-10-CM | POA: Diagnosis not present

## 2022-10-30 DIAGNOSIS — Z992 Dependence on renal dialysis: Secondary | ICD-10-CM | POA: Diagnosis not present

## 2022-10-30 DIAGNOSIS — D509 Iron deficiency anemia, unspecified: Secondary | ICD-10-CM | POA: Diagnosis not present

## 2022-10-30 DIAGNOSIS — D631 Anemia in chronic kidney disease: Secondary | ICD-10-CM | POA: Diagnosis not present

## 2022-10-30 DIAGNOSIS — N186 End stage renal disease: Secondary | ICD-10-CM | POA: Diagnosis not present

## 2022-10-31 IMAGING — MR MR ABDOMEN WO/W CM
15 of 17 series · 41 of 48 positions shown · IV contrast (14ml Multihance)
Comparison: Renal ultrasound on 10/15/2019

CLINICAL DATA: Right renal mass on recent ultrasound.

EXAM:
MRI ABDOMEN WITHOUT AND WITH CONTRAST
TECHNIQUE: Multiplanar multisequence MR imaging of the abdomen was performed
both before and after the administration of intravenous contrast.
CONTRAST:  14mL MULTIHANCE GADOBENATE DIMEGLUMINE 529 MG/ML IV SOLN

[Series 3: T2 · coronal · 5.0mm · 0.78mm/px · 2 of 18 slices shown (1 of 3)]
[im 1/18]
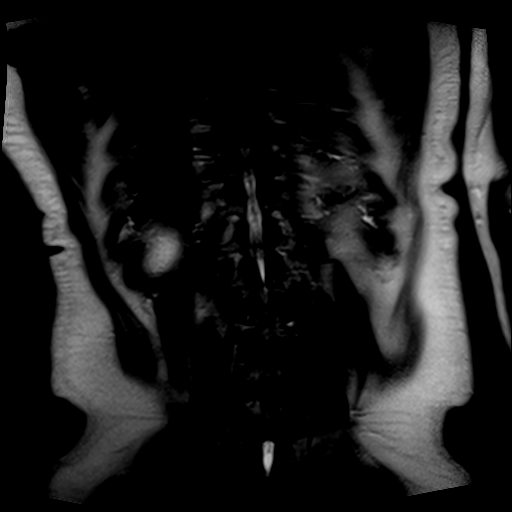
[im 18/18]
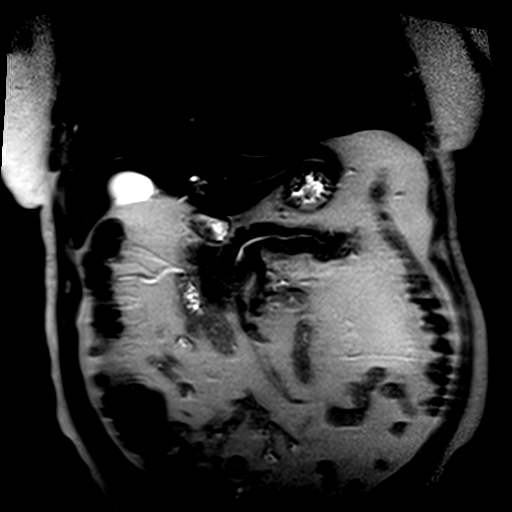

[Series 4: T2 · axial · 5.0mm · 0.66mm/px · z∈[-69,+63]mm · 2 of 23 slices shown (2 of 3)]
[im 1/23]
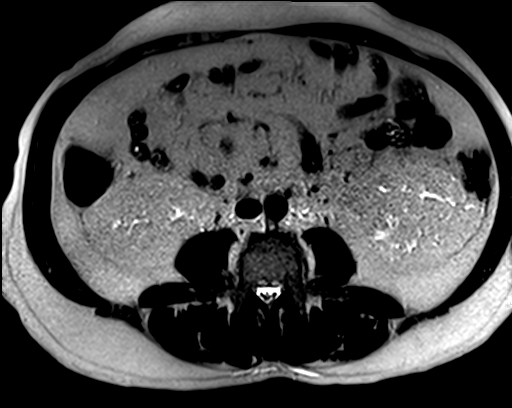
[im 23/23]
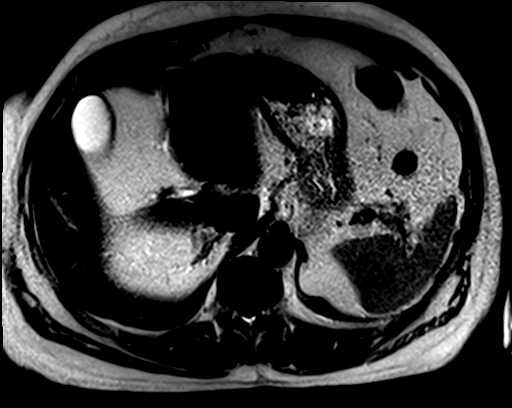

[Series 5: bSSFP · axial · 4.0mm · 0.74mm/px · z∈[-81,+67]mm · 3 of 38 slices shown]
[im 1/38]
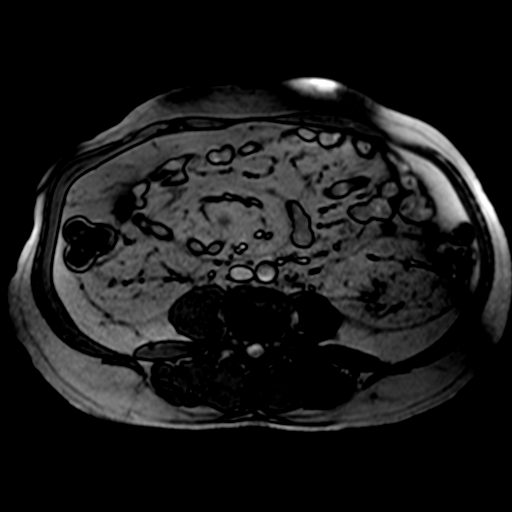
[im 19/38]
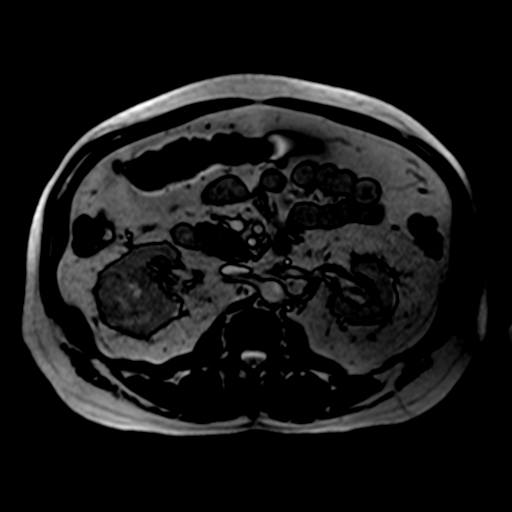
[im 38/38]
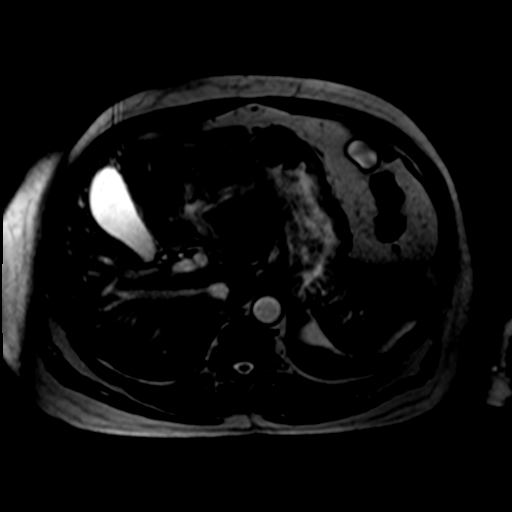

[Series 6: T1 · axial · 5.0mm · 0.66mm/px · z∈[-73,+65]mm · 4 of 48 slices shown]
[im 1/48]
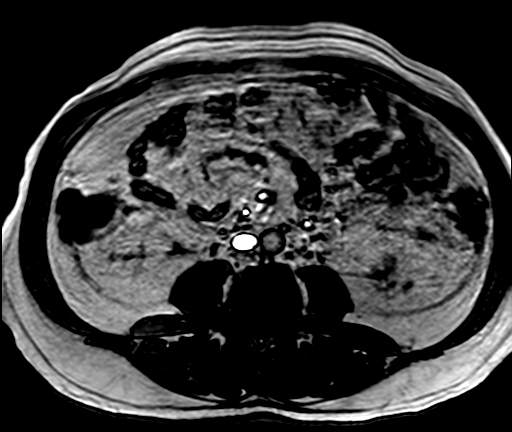
[im 16/48]
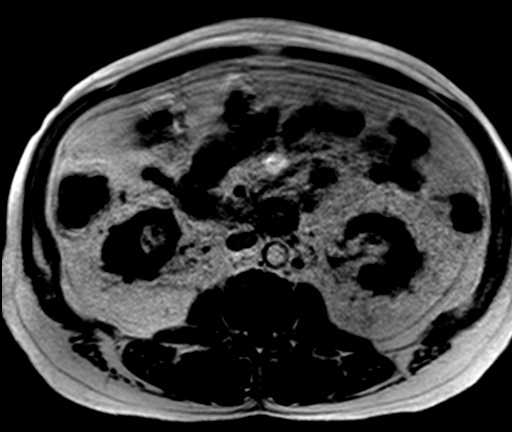
[im 32/48]
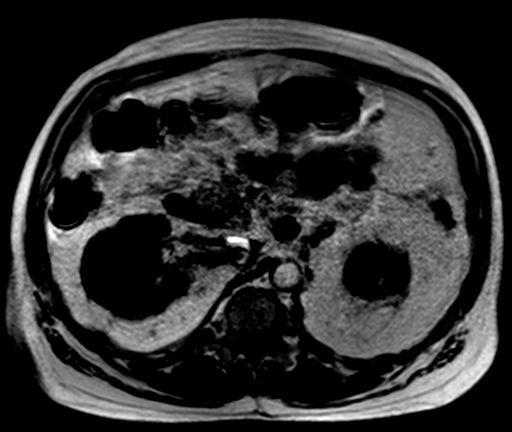
[im 48/48]
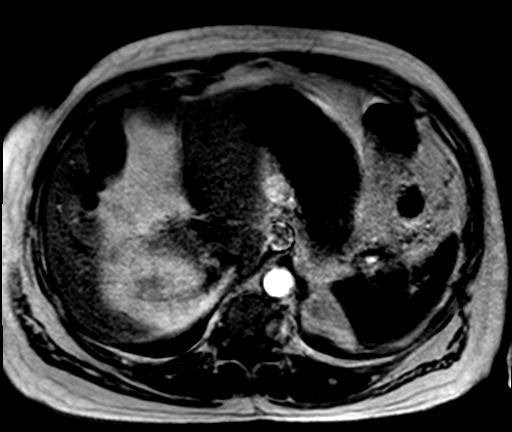

[Series 7: ep2d_diff_b50_500_800_p2_trig · axial · 6.0mm · 1.98mm/px · z∈[-67,+89]mm · 5 of 63 slices shown]
[im 1/63]
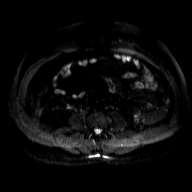
[im 16/63]
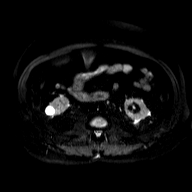
[im 32/63]
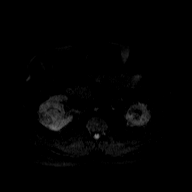
[im 47/63]
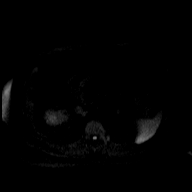
[im 63/63]
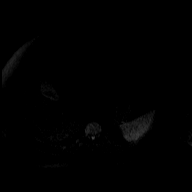

[Series 8: ep2d_[id number]_500_800_p2_trig_adc · axial · 6.0mm · 1.98mm/px · 1 of 21 slices shown]
[im 1/21]
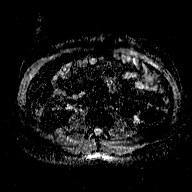

[Series 9: T2 · axial · 5.0mm · 1.33mm/px · 1 of 25 slices shown (3 of 3)]
[im 1/25]
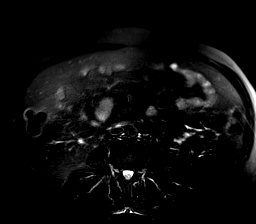

[Series 10: T1 dynamic · axial · non-contrast · 2.3mm · 1.41mm/px · z∈[-78,+58]mm · 3 of 60 slices shown]
[im 1/60]
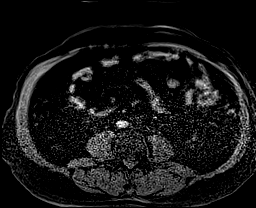
[im 30/60]
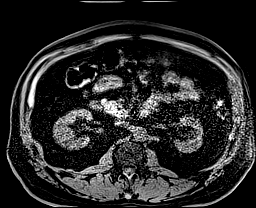
[im 60/60]
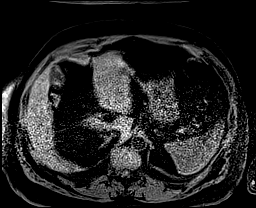

[Series 11: post 25 sec · axial · 2.3mm · 1.41mm/px · z∈[-78,+58]mm · 3 of 60 slices shown]
[im 1/60]
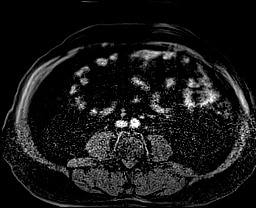
[im 30/60]
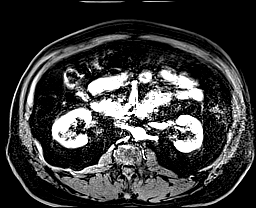
[im 60/60]
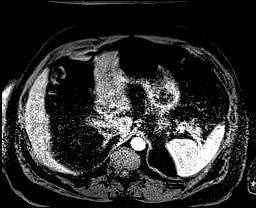

[Series 12: post 25 sec_sub · axial · 2.3mm · 1.41mm/px · z∈[-78,+58]mm · 3 of 60 slices shown]
[im 1/60]
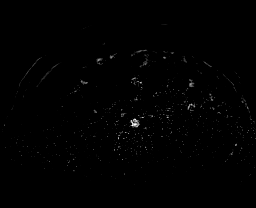
[im 30/60]
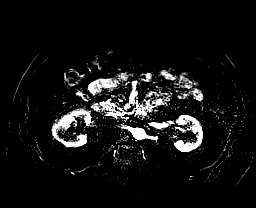
[im 60/60]
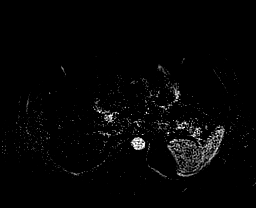

[Series 13: post 45 sec · axial · 2.3mm · 1.41mm/px · z∈[-78,+58]mm · 3 of 60 slices shown]
[im 1/60]
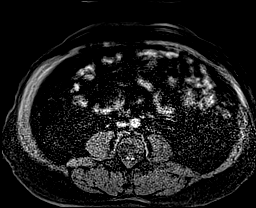
[im 30/60]
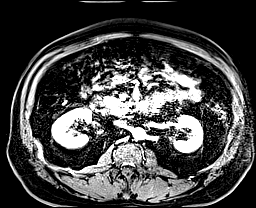
[im 60/60]
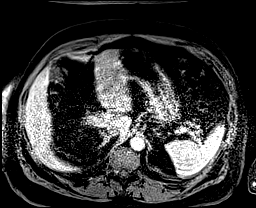

[Series 14: post 45 sec_sub · axial · 2.3mm · 1.41mm/px · z∈[-78,+58]mm · 3 of 60 slices shown]
[im 1/60]
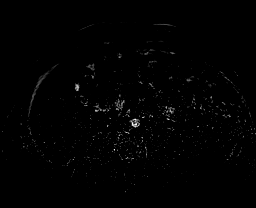
[im 30/60]
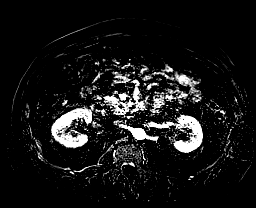
[im 60/60]
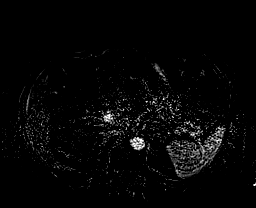

[Series 15: post 90 sec · axial · 2.3mm · 1.41mm/px · z∈[-78,+58]mm · 3 of 60 slices shown]
[im 1/60]
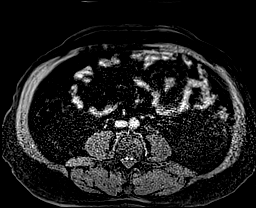
[im 30/60]
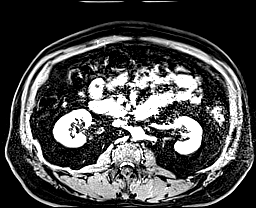
[im 60/60]
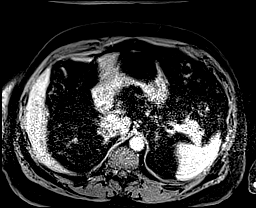

[Series 16: post 90 sec_sub · axial · 2.3mm · 1.41mm/px · z∈[-78,+58]mm · 3 of 60 slices shown]
[im 1/60]
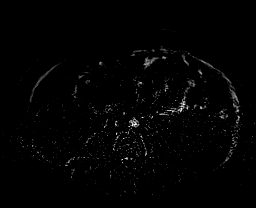
[im 30/60]
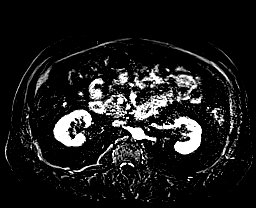
[im 60/60]
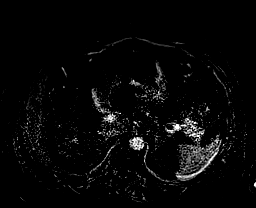

[Series 17: T1 dynamic post-contrast · coronal · 2.6mm · 0.78mm/px · 2 of 52 slices shown]
[im 1/52]
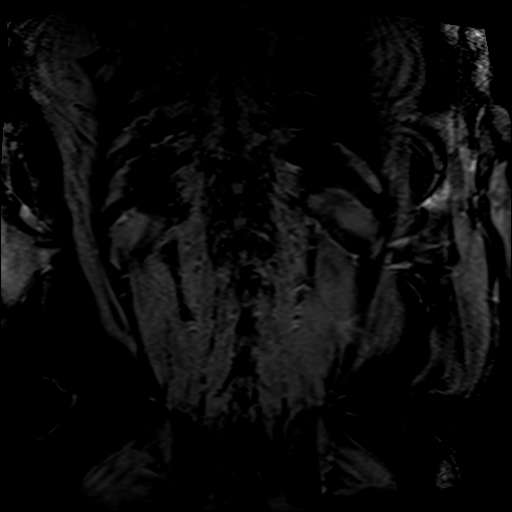
[im 26/52]
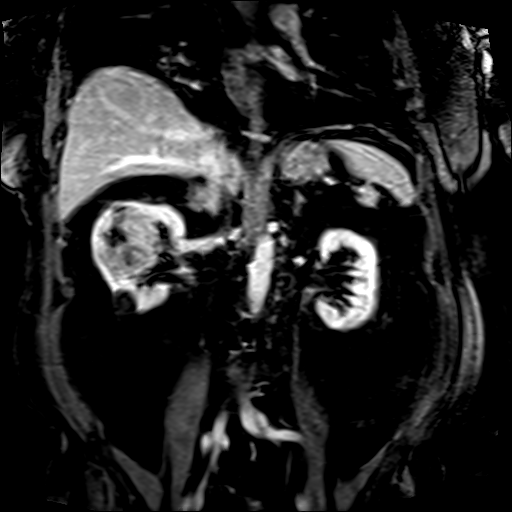

[41 of 48 positions shown; findings below may reference images not displayed]

FINDINGS: Lower chest: No acute findings.

Hepatobiliary: Visualized portion is unremarkable.

Pancreas:  No mass or inflammatory changes.

Spleen:  Within normal limits in size and appearance.

Adrenals/Urinary Tract: A 1.9 cm right adrenal mass shows signal
dropout chemical shift imaging, consistent with a benign adrenal
adenoma.

The left kidney is unremarkable in appearance. A small simple cyst
is seen in the lower pole the right kidney. A solid heterogeneously
enhancing mass is seen centered in the lateral interpolar region of
the right kidney, which measures 5.9 x 5.0 cm. This is consistent
with renal cell carcinoma. No evidence of hydronephrosis.

Stomach/Bowel: Visualized portion unremarkable.

Vascular/Lymphatic: No pathologically enlarged lymph nodes
identified. No abdominal aortic aneurysm. Although there is no
evidence of thrombus within the right renal vein, there is a T2
hyperintense filling defect in the IVC which measures 8 mm (image
[DATE]), consistent with nonocclusive thrombus. This shows no definite
contrast enhancement, which favors bland thrombus over tumor
thrombus.

Other:  None.

Musculoskeletal:  No suspicious bone lesions identified.
IMPRESSION: 5.9 cm solid heterogeneously enhancing mass centered in the lateral
interpolar region of the right kidney, consistent with renal cell
carcinoma.

Nonocclusive thrombus in the IVC. Lack of contrast enhancement
favors bland thrombus, however tumor thrombus cannot definitely be
excluded.

No other abdominal metastatic disease.

1.9 cm benign right adrenal adenoma.

These results will be called to the ordering clinician or
representative by the Radiologist Assistant, and communication
documented in the PACS or [REDACTED].

## 2022-11-02 DIAGNOSIS — N2581 Secondary hyperparathyroidism of renal origin: Secondary | ICD-10-CM | POA: Diagnosis not present

## 2022-11-02 DIAGNOSIS — Z23 Encounter for immunization: Secondary | ICD-10-CM | POA: Diagnosis not present

## 2022-11-02 DIAGNOSIS — D509 Iron deficiency anemia, unspecified: Secondary | ICD-10-CM | POA: Diagnosis not present

## 2022-11-02 DIAGNOSIS — Z992 Dependence on renal dialysis: Secondary | ICD-10-CM | POA: Diagnosis not present

## 2022-11-02 DIAGNOSIS — N186 End stage renal disease: Secondary | ICD-10-CM | POA: Diagnosis not present

## 2022-11-02 DIAGNOSIS — D631 Anemia in chronic kidney disease: Secondary | ICD-10-CM | POA: Diagnosis not present

## 2022-11-04 DIAGNOSIS — N2581 Secondary hyperparathyroidism of renal origin: Secondary | ICD-10-CM | POA: Diagnosis not present

## 2022-11-04 DIAGNOSIS — D509 Iron deficiency anemia, unspecified: Secondary | ICD-10-CM | POA: Diagnosis not present

## 2022-11-04 DIAGNOSIS — Z992 Dependence on renal dialysis: Secondary | ICD-10-CM | POA: Diagnosis not present

## 2022-11-04 DIAGNOSIS — Z23 Encounter for immunization: Secondary | ICD-10-CM | POA: Diagnosis not present

## 2022-11-04 DIAGNOSIS — D631 Anemia in chronic kidney disease: Secondary | ICD-10-CM | POA: Diagnosis not present

## 2022-11-04 DIAGNOSIS — N186 End stage renal disease: Secondary | ICD-10-CM | POA: Diagnosis not present

## 2022-11-05 DIAGNOSIS — Z992 Dependence on renal dialysis: Secondary | ICD-10-CM | POA: Diagnosis not present

## 2022-11-05 DIAGNOSIS — N186 End stage renal disease: Secondary | ICD-10-CM | POA: Diagnosis not present

## 2022-11-05 DIAGNOSIS — C649 Malignant neoplasm of unspecified kidney, except renal pelvis: Secondary | ICD-10-CM | POA: Diagnosis not present

## 2022-11-06 DIAGNOSIS — D631 Anemia in chronic kidney disease: Secondary | ICD-10-CM | POA: Diagnosis not present

## 2022-11-06 DIAGNOSIS — N186 End stage renal disease: Secondary | ICD-10-CM | POA: Diagnosis not present

## 2022-11-06 DIAGNOSIS — Z992 Dependence on renal dialysis: Secondary | ICD-10-CM | POA: Diagnosis not present

## 2022-11-06 DIAGNOSIS — N2581 Secondary hyperparathyroidism of renal origin: Secondary | ICD-10-CM | POA: Diagnosis not present

## 2022-11-09 DIAGNOSIS — Z992 Dependence on renal dialysis: Secondary | ICD-10-CM | POA: Diagnosis not present

## 2022-11-09 DIAGNOSIS — D631 Anemia in chronic kidney disease: Secondary | ICD-10-CM | POA: Diagnosis not present

## 2022-11-09 DIAGNOSIS — N2581 Secondary hyperparathyroidism of renal origin: Secondary | ICD-10-CM | POA: Diagnosis not present

## 2022-11-09 DIAGNOSIS — N186 End stage renal disease: Secondary | ICD-10-CM | POA: Diagnosis not present

## 2022-11-10 DIAGNOSIS — N19 Unspecified kidney failure: Secondary | ICD-10-CM | POA: Diagnosis not present

## 2022-11-10 DIAGNOSIS — I82220 Acute embolism and thrombosis of inferior vena cava: Secondary | ICD-10-CM | POA: Diagnosis not present

## 2022-11-10 DIAGNOSIS — E119 Type 2 diabetes mellitus without complications: Secondary | ICD-10-CM | POA: Diagnosis not present

## 2022-11-10 DIAGNOSIS — Z905 Acquired absence of kidney: Secondary | ICD-10-CM | POA: Diagnosis not present

## 2022-11-10 DIAGNOSIS — I10 Essential (primary) hypertension: Secondary | ICD-10-CM | POA: Diagnosis not present

## 2022-11-10 DIAGNOSIS — Z87891 Personal history of nicotine dependence: Secondary | ICD-10-CM | POA: Diagnosis not present

## 2022-11-10 DIAGNOSIS — Z5112 Encounter for antineoplastic immunotherapy: Secondary | ICD-10-CM | POA: Diagnosis not present

## 2022-11-10 DIAGNOSIS — R918 Other nonspecific abnormal finding of lung field: Secondary | ICD-10-CM | POA: Diagnosis not present

## 2022-11-10 DIAGNOSIS — C641 Malignant neoplasm of right kidney, except renal pelvis: Secondary | ICD-10-CM | POA: Diagnosis not present

## 2022-11-11 DIAGNOSIS — Z992 Dependence on renal dialysis: Secondary | ICD-10-CM | POA: Diagnosis not present

## 2022-11-11 DIAGNOSIS — N186 End stage renal disease: Secondary | ICD-10-CM | POA: Diagnosis not present

## 2022-11-11 DIAGNOSIS — N2581 Secondary hyperparathyroidism of renal origin: Secondary | ICD-10-CM | POA: Diagnosis not present

## 2022-11-11 DIAGNOSIS — D631 Anemia in chronic kidney disease: Secondary | ICD-10-CM | POA: Diagnosis not present

## 2022-11-13 DIAGNOSIS — Z992 Dependence on renal dialysis: Secondary | ICD-10-CM | POA: Diagnosis not present

## 2022-11-13 DIAGNOSIS — N2581 Secondary hyperparathyroidism of renal origin: Secondary | ICD-10-CM | POA: Diagnosis not present

## 2022-11-13 DIAGNOSIS — N186 End stage renal disease: Secondary | ICD-10-CM | POA: Diagnosis not present

## 2022-11-13 DIAGNOSIS — D631 Anemia in chronic kidney disease: Secondary | ICD-10-CM | POA: Diagnosis not present

## 2022-11-16 DIAGNOSIS — N2581 Secondary hyperparathyroidism of renal origin: Secondary | ICD-10-CM | POA: Diagnosis not present

## 2022-11-16 DIAGNOSIS — N186 End stage renal disease: Secondary | ICD-10-CM | POA: Diagnosis not present

## 2022-11-16 DIAGNOSIS — D631 Anemia in chronic kidney disease: Secondary | ICD-10-CM | POA: Diagnosis not present

## 2022-11-16 DIAGNOSIS — Z992 Dependence on renal dialysis: Secondary | ICD-10-CM | POA: Diagnosis not present

## 2022-11-18 DIAGNOSIS — D631 Anemia in chronic kidney disease: Secondary | ICD-10-CM | POA: Diagnosis not present

## 2022-11-18 DIAGNOSIS — N186 End stage renal disease: Secondary | ICD-10-CM | POA: Diagnosis not present

## 2022-11-18 DIAGNOSIS — Z992 Dependence on renal dialysis: Secondary | ICD-10-CM | POA: Diagnosis not present

## 2022-11-18 DIAGNOSIS — N2581 Secondary hyperparathyroidism of renal origin: Secondary | ICD-10-CM | POA: Diagnosis not present

## 2022-11-19 DIAGNOSIS — R051 Acute cough: Secondary | ICD-10-CM | POA: Diagnosis not present

## 2022-11-19 DIAGNOSIS — J9811 Atelectasis: Secondary | ICD-10-CM | POA: Diagnosis not present

## 2022-11-20 DIAGNOSIS — N2581 Secondary hyperparathyroidism of renal origin: Secondary | ICD-10-CM | POA: Diagnosis not present

## 2022-11-20 DIAGNOSIS — N186 End stage renal disease: Secondary | ICD-10-CM | POA: Diagnosis not present

## 2022-11-20 DIAGNOSIS — D631 Anemia in chronic kidney disease: Secondary | ICD-10-CM | POA: Diagnosis not present

## 2022-11-20 DIAGNOSIS — Z992 Dependence on renal dialysis: Secondary | ICD-10-CM | POA: Diagnosis not present

## 2022-11-23 DIAGNOSIS — D631 Anemia in chronic kidney disease: Secondary | ICD-10-CM | POA: Diagnosis not present

## 2022-11-23 DIAGNOSIS — N186 End stage renal disease: Secondary | ICD-10-CM | POA: Diagnosis not present

## 2022-11-23 DIAGNOSIS — Z992 Dependence on renal dialysis: Secondary | ICD-10-CM | POA: Diagnosis not present

## 2022-11-23 DIAGNOSIS — N2581 Secondary hyperparathyroidism of renal origin: Secondary | ICD-10-CM | POA: Diagnosis not present

## 2022-11-25 DIAGNOSIS — N2581 Secondary hyperparathyroidism of renal origin: Secondary | ICD-10-CM | POA: Diagnosis not present

## 2022-11-25 DIAGNOSIS — Z992 Dependence on renal dialysis: Secondary | ICD-10-CM | POA: Diagnosis not present

## 2022-11-25 DIAGNOSIS — N186 End stage renal disease: Secondary | ICD-10-CM | POA: Diagnosis not present

## 2022-11-25 DIAGNOSIS — D631 Anemia in chronic kidney disease: Secondary | ICD-10-CM | POA: Diagnosis not present

## 2022-11-27 DIAGNOSIS — Z79899 Other long term (current) drug therapy: Secondary | ICD-10-CM | POA: Diagnosis not present

## 2022-11-27 DIAGNOSIS — I69392 Facial weakness following cerebral infarction: Secondary | ICD-10-CM | POA: Diagnosis not present

## 2022-11-27 DIAGNOSIS — D849 Immunodeficiency, unspecified: Secondary | ICD-10-CM | POA: Diagnosis not present

## 2022-11-27 DIAGNOSIS — I6381 Other cerebral infarction due to occlusion or stenosis of small artery: Secondary | ICD-10-CM | POA: Diagnosis not present

## 2022-11-27 DIAGNOSIS — R531 Weakness: Secondary | ICD-10-CM | POA: Diagnosis not present

## 2022-11-27 DIAGNOSIS — D72829 Elevated white blood cell count, unspecified: Secondary | ICD-10-CM | POA: Diagnosis not present

## 2022-11-27 DIAGNOSIS — R509 Fever, unspecified: Secondary | ICD-10-CM | POA: Diagnosis not present

## 2022-11-27 DIAGNOSIS — C641 Malignant neoplasm of right kidney, except renal pelvis: Secondary | ICD-10-CM | POA: Diagnosis not present

## 2022-11-27 DIAGNOSIS — E663 Overweight: Secondary | ICD-10-CM | POA: Diagnosis not present

## 2022-11-27 DIAGNOSIS — I639 Cerebral infarction, unspecified: Secondary | ICD-10-CM | POA: Diagnosis not present

## 2022-11-27 DIAGNOSIS — E876 Hypokalemia: Secondary | ICD-10-CM | POA: Diagnosis not present

## 2022-11-27 DIAGNOSIS — R200 Anesthesia of skin: Secondary | ICD-10-CM | POA: Diagnosis not present

## 2022-11-27 DIAGNOSIS — I6502 Occlusion and stenosis of left vertebral artery: Secondary | ICD-10-CM | POA: Diagnosis not present

## 2022-11-27 DIAGNOSIS — R29818 Other symptoms and signs involving the nervous system: Secondary | ICD-10-CM | POA: Diagnosis not present

## 2022-11-27 DIAGNOSIS — Z7409 Other reduced mobility: Secondary | ICD-10-CM | POA: Diagnosis not present

## 2022-11-27 DIAGNOSIS — G319 Degenerative disease of nervous system, unspecified: Secondary | ICD-10-CM | POA: Diagnosis not present

## 2022-11-27 DIAGNOSIS — I120 Hypertensive chronic kidney disease with stage 5 chronic kidney disease or end stage renal disease: Secondary | ICD-10-CM | POA: Diagnosis not present

## 2022-11-27 DIAGNOSIS — R000 Tachycardia, unspecified: Secondary | ICD-10-CM | POA: Diagnosis not present

## 2022-11-27 DIAGNOSIS — D539 Nutritional anemia, unspecified: Secondary | ICD-10-CM | POA: Diagnosis not present

## 2022-11-27 DIAGNOSIS — I6930 Unspecified sequelae of cerebral infarction: Secondary | ICD-10-CM | POA: Diagnosis not present

## 2022-11-27 DIAGNOSIS — Z9889 Other specified postprocedural states: Secondary | ICD-10-CM | POA: Diagnosis not present

## 2022-11-27 DIAGNOSIS — D631 Anemia in chronic kidney disease: Secondary | ICD-10-CM | POA: Diagnosis not present

## 2022-11-27 DIAGNOSIS — E1122 Type 2 diabetes mellitus with diabetic chronic kidney disease: Secondary | ICD-10-CM | POA: Diagnosis not present

## 2022-11-27 DIAGNOSIS — R2990 Unspecified symptoms and signs involving the nervous system: Secondary | ICD-10-CM | POA: Diagnosis not present

## 2022-11-27 DIAGNOSIS — Z992 Dependence on renal dialysis: Secondary | ICD-10-CM | POA: Diagnosis not present

## 2022-11-27 DIAGNOSIS — Z6827 Body mass index (BMI) 27.0-27.9, adult: Secondary | ICD-10-CM | POA: Diagnosis not present

## 2022-11-27 DIAGNOSIS — R29703 NIHSS score 3: Secondary | ICD-10-CM | POA: Diagnosis not present

## 2022-11-27 DIAGNOSIS — E1165 Type 2 diabetes mellitus with hyperglycemia: Secondary | ICD-10-CM | POA: Diagnosis not present

## 2022-11-27 DIAGNOSIS — N186 End stage renal disease: Secondary | ICD-10-CM | POA: Diagnosis not present

## 2022-11-27 DIAGNOSIS — I69354 Hemiplegia and hemiparesis following cerebral infarction affecting left non-dominant side: Secondary | ICD-10-CM | POA: Diagnosis not present

## 2022-11-27 DIAGNOSIS — N2581 Secondary hyperparathyroidism of renal origin: Secondary | ICD-10-CM | POA: Diagnosis not present

## 2022-11-27 DIAGNOSIS — I6389 Other cerebral infarction: Secondary | ICD-10-CM | POA: Diagnosis not present

## 2022-11-27 DIAGNOSIS — G459 Transient cerebral ischemic attack, unspecified: Secondary | ICD-10-CM | POA: Diagnosis not present

## 2022-11-27 DIAGNOSIS — I080 Rheumatic disorders of both mitral and aortic valves: Secondary | ICD-10-CM | POA: Diagnosis not present

## 2022-11-30 DIAGNOSIS — N186 End stage renal disease: Secondary | ICD-10-CM | POA: Diagnosis not present

## 2022-11-30 DIAGNOSIS — D631 Anemia in chronic kidney disease: Secondary | ICD-10-CM | POA: Diagnosis not present

## 2022-11-30 DIAGNOSIS — Z992 Dependence on renal dialysis: Secondary | ICD-10-CM | POA: Diagnosis not present

## 2022-11-30 DIAGNOSIS — N2581 Secondary hyperparathyroidism of renal origin: Secondary | ICD-10-CM | POA: Diagnosis not present

## 2022-12-02 DIAGNOSIS — D631 Anemia in chronic kidney disease: Secondary | ICD-10-CM | POA: Diagnosis not present

## 2022-12-02 DIAGNOSIS — N2581 Secondary hyperparathyroidism of renal origin: Secondary | ICD-10-CM | POA: Diagnosis not present

## 2022-12-02 DIAGNOSIS — Z992 Dependence on renal dialysis: Secondary | ICD-10-CM | POA: Diagnosis not present

## 2022-12-02 DIAGNOSIS — N186 End stage renal disease: Secondary | ICD-10-CM | POA: Diagnosis not present

## 2022-12-04 DIAGNOSIS — D631 Anemia in chronic kidney disease: Secondary | ICD-10-CM | POA: Diagnosis not present

## 2022-12-04 DIAGNOSIS — N186 End stage renal disease: Secondary | ICD-10-CM | POA: Diagnosis not present

## 2022-12-04 DIAGNOSIS — N2581 Secondary hyperparathyroidism of renal origin: Secondary | ICD-10-CM | POA: Diagnosis not present

## 2022-12-04 DIAGNOSIS — Z992 Dependence on renal dialysis: Secondary | ICD-10-CM | POA: Diagnosis not present

## 2022-12-05 DIAGNOSIS — Z992 Dependence on renal dialysis: Secondary | ICD-10-CM | POA: Diagnosis not present

## 2022-12-05 DIAGNOSIS — C649 Malignant neoplasm of unspecified kidney, except renal pelvis: Secondary | ICD-10-CM | POA: Diagnosis not present

## 2022-12-05 DIAGNOSIS — N186 End stage renal disease: Secondary | ICD-10-CM | POA: Diagnosis not present

## 2022-12-07 DIAGNOSIS — N2581 Secondary hyperparathyroidism of renal origin: Secondary | ICD-10-CM | POA: Diagnosis not present

## 2022-12-07 DIAGNOSIS — K769 Liver disease, unspecified: Secondary | ICD-10-CM | POA: Diagnosis not present

## 2022-12-07 DIAGNOSIS — N186 End stage renal disease: Secondary | ICD-10-CM | POA: Diagnosis not present

## 2022-12-07 DIAGNOSIS — Z992 Dependence on renal dialysis: Secondary | ICD-10-CM | POA: Diagnosis not present

## 2022-12-07 DIAGNOSIS — D631 Anemia in chronic kidney disease: Secondary | ICD-10-CM | POA: Diagnosis not present

## 2022-12-09 DIAGNOSIS — K769 Liver disease, unspecified: Secondary | ICD-10-CM | POA: Diagnosis not present

## 2022-12-09 DIAGNOSIS — N186 End stage renal disease: Secondary | ICD-10-CM | POA: Diagnosis not present

## 2022-12-09 DIAGNOSIS — Z992 Dependence on renal dialysis: Secondary | ICD-10-CM | POA: Diagnosis not present

## 2022-12-09 DIAGNOSIS — N2581 Secondary hyperparathyroidism of renal origin: Secondary | ICD-10-CM | POA: Diagnosis not present

## 2022-12-09 DIAGNOSIS — D631 Anemia in chronic kidney disease: Secondary | ICD-10-CM | POA: Diagnosis not present

## 2022-12-10 DIAGNOSIS — Z5112 Encounter for antineoplastic immunotherapy: Secondary | ICD-10-CM | POA: Diagnosis not present

## 2022-12-10 DIAGNOSIS — C641 Malignant neoplasm of right kidney, except renal pelvis: Secondary | ICD-10-CM | POA: Diagnosis not present

## 2022-12-11 DIAGNOSIS — N2581 Secondary hyperparathyroidism of renal origin: Secondary | ICD-10-CM | POA: Diagnosis not present

## 2022-12-11 DIAGNOSIS — D631 Anemia in chronic kidney disease: Secondary | ICD-10-CM | POA: Diagnosis not present

## 2022-12-11 DIAGNOSIS — K769 Liver disease, unspecified: Secondary | ICD-10-CM | POA: Diagnosis not present

## 2022-12-11 DIAGNOSIS — Z992 Dependence on renal dialysis: Secondary | ICD-10-CM | POA: Diagnosis not present

## 2022-12-11 DIAGNOSIS — N186 End stage renal disease: Secondary | ICD-10-CM | POA: Diagnosis not present

## 2022-12-13 DIAGNOSIS — Z992 Dependence on renal dialysis: Secondary | ICD-10-CM | POA: Diagnosis not present

## 2022-12-13 DIAGNOSIS — N186 End stage renal disease: Secondary | ICD-10-CM | POA: Diagnosis not present

## 2022-12-13 DIAGNOSIS — E1122 Type 2 diabetes mellitus with diabetic chronic kidney disease: Secondary | ICD-10-CM | POA: Diagnosis not present

## 2022-12-13 DIAGNOSIS — I120 Hypertensive chronic kidney disease with stage 5 chronic kidney disease or end stage renal disease: Secondary | ICD-10-CM | POA: Diagnosis not present

## 2022-12-13 DIAGNOSIS — Z794 Long term (current) use of insulin: Secondary | ICD-10-CM | POA: Diagnosis not present

## 2022-12-13 DIAGNOSIS — D631 Anemia in chronic kidney disease: Secondary | ICD-10-CM | POA: Diagnosis not present

## 2022-12-13 DIAGNOSIS — T82898A Other specified complication of vascular prosthetic devices, implants and grafts, initial encounter: Secondary | ICD-10-CM | POA: Diagnosis not present

## 2022-12-14 DIAGNOSIS — K769 Liver disease, unspecified: Secondary | ICD-10-CM | POA: Diagnosis not present

## 2022-12-14 DIAGNOSIS — N186 End stage renal disease: Secondary | ICD-10-CM | POA: Diagnosis not present

## 2022-12-14 DIAGNOSIS — D631 Anemia in chronic kidney disease: Secondary | ICD-10-CM | POA: Diagnosis not present

## 2022-12-14 DIAGNOSIS — N2581 Secondary hyperparathyroidism of renal origin: Secondary | ICD-10-CM | POA: Diagnosis not present

## 2022-12-14 DIAGNOSIS — Z992 Dependence on renal dialysis: Secondary | ICD-10-CM | POA: Diagnosis not present

## 2022-12-16 DIAGNOSIS — Z992 Dependence on renal dialysis: Secondary | ICD-10-CM | POA: Diagnosis not present

## 2022-12-16 DIAGNOSIS — N186 End stage renal disease: Secondary | ICD-10-CM | POA: Diagnosis not present

## 2022-12-16 DIAGNOSIS — K769 Liver disease, unspecified: Secondary | ICD-10-CM | POA: Diagnosis not present

## 2022-12-16 DIAGNOSIS — D631 Anemia in chronic kidney disease: Secondary | ICD-10-CM | POA: Diagnosis not present

## 2022-12-16 DIAGNOSIS — N2581 Secondary hyperparathyroidism of renal origin: Secondary | ICD-10-CM | POA: Diagnosis not present

## 2022-12-17 DIAGNOSIS — Z992 Dependence on renal dialysis: Secondary | ICD-10-CM | POA: Diagnosis not present

## 2022-12-17 DIAGNOSIS — Z794 Long term (current) use of insulin: Secondary | ICD-10-CM | POA: Diagnosis not present

## 2022-12-17 DIAGNOSIS — E1122 Type 2 diabetes mellitus with diabetic chronic kidney disease: Secondary | ICD-10-CM | POA: Diagnosis not present

## 2022-12-17 DIAGNOSIS — D689 Coagulation defect, unspecified: Secondary | ICD-10-CM | POA: Diagnosis not present

## 2022-12-17 DIAGNOSIS — I69959 Hemiplegia and hemiparesis following unspecified cerebrovascular disease affecting unspecified side: Secondary | ICD-10-CM | POA: Diagnosis not present

## 2022-12-17 DIAGNOSIS — N186 End stage renal disease: Secondary | ICD-10-CM | POA: Diagnosis not present

## 2022-12-17 DIAGNOSIS — E1165 Type 2 diabetes mellitus with hyperglycemia: Secondary | ICD-10-CM | POA: Diagnosis not present

## 2022-12-17 DIAGNOSIS — R531 Weakness: Secondary | ICD-10-CM | POA: Diagnosis not present

## 2022-12-17 DIAGNOSIS — E785 Hyperlipidemia, unspecified: Secondary | ICD-10-CM | POA: Diagnosis not present

## 2022-12-17 DIAGNOSIS — I10 Essential (primary) hypertension: Secondary | ICD-10-CM | POA: Diagnosis not present

## 2022-12-17 DIAGNOSIS — Z1330 Encounter for screening examination for mental health and behavioral disorders, unspecified: Secondary | ICD-10-CM | POA: Diagnosis not present

## 2022-12-17 DIAGNOSIS — Z8673 Personal history of transient ischemic attack (TIA), and cerebral infarction without residual deficits: Secondary | ICD-10-CM | POA: Diagnosis not present

## 2022-12-18 DIAGNOSIS — Z992 Dependence on renal dialysis: Secondary | ICD-10-CM | POA: Diagnosis not present

## 2022-12-18 DIAGNOSIS — K769 Liver disease, unspecified: Secondary | ICD-10-CM | POA: Diagnosis not present

## 2022-12-18 DIAGNOSIS — D631 Anemia in chronic kidney disease: Secondary | ICD-10-CM | POA: Diagnosis not present

## 2022-12-18 DIAGNOSIS — N2581 Secondary hyperparathyroidism of renal origin: Secondary | ICD-10-CM | POA: Diagnosis not present

## 2022-12-18 DIAGNOSIS — N186 End stage renal disease: Secondary | ICD-10-CM | POA: Diagnosis not present

## 2022-12-21 DIAGNOSIS — Z992 Dependence on renal dialysis: Secondary | ICD-10-CM | POA: Diagnosis not present

## 2022-12-21 DIAGNOSIS — N186 End stage renal disease: Secondary | ICD-10-CM | POA: Diagnosis not present

## 2022-12-21 DIAGNOSIS — D631 Anemia in chronic kidney disease: Secondary | ICD-10-CM | POA: Diagnosis not present

## 2022-12-21 DIAGNOSIS — K769 Liver disease, unspecified: Secondary | ICD-10-CM | POA: Diagnosis not present

## 2022-12-21 DIAGNOSIS — N2581 Secondary hyperparathyroidism of renal origin: Secondary | ICD-10-CM | POA: Diagnosis not present

## 2022-12-25 DIAGNOSIS — D631 Anemia in chronic kidney disease: Secondary | ICD-10-CM | POA: Diagnosis not present

## 2022-12-25 DIAGNOSIS — Z992 Dependence on renal dialysis: Secondary | ICD-10-CM | POA: Diagnosis not present

## 2022-12-25 DIAGNOSIS — N186 End stage renal disease: Secondary | ICD-10-CM | POA: Diagnosis not present

## 2022-12-25 DIAGNOSIS — K769 Liver disease, unspecified: Secondary | ICD-10-CM | POA: Diagnosis not present

## 2022-12-25 DIAGNOSIS — N2581 Secondary hyperparathyroidism of renal origin: Secondary | ICD-10-CM | POA: Diagnosis not present

## 2022-12-27 DIAGNOSIS — Z992 Dependence on renal dialysis: Secondary | ICD-10-CM | POA: Diagnosis not present

## 2022-12-27 DIAGNOSIS — N186 End stage renal disease: Secondary | ICD-10-CM | POA: Diagnosis not present

## 2022-12-27 DIAGNOSIS — K769 Liver disease, unspecified: Secondary | ICD-10-CM | POA: Diagnosis not present

## 2022-12-27 DIAGNOSIS — D631 Anemia in chronic kidney disease: Secondary | ICD-10-CM | POA: Diagnosis not present

## 2022-12-27 DIAGNOSIS — N2581 Secondary hyperparathyroidism of renal origin: Secondary | ICD-10-CM | POA: Diagnosis not present

## 2022-12-30 DIAGNOSIS — N186 End stage renal disease: Secondary | ICD-10-CM | POA: Diagnosis not present

## 2022-12-30 DIAGNOSIS — N2581 Secondary hyperparathyroidism of renal origin: Secondary | ICD-10-CM | POA: Diagnosis not present

## 2022-12-30 DIAGNOSIS — D631 Anemia in chronic kidney disease: Secondary | ICD-10-CM | POA: Diagnosis not present

## 2022-12-30 DIAGNOSIS — Z992 Dependence on renal dialysis: Secondary | ICD-10-CM | POA: Diagnosis not present

## 2022-12-30 DIAGNOSIS — K769 Liver disease, unspecified: Secondary | ICD-10-CM | POA: Diagnosis not present

## 2023-01-01 DIAGNOSIS — Z992 Dependence on renal dialysis: Secondary | ICD-10-CM | POA: Diagnosis not present

## 2023-01-01 DIAGNOSIS — D631 Anemia in chronic kidney disease: Secondary | ICD-10-CM | POA: Diagnosis not present

## 2023-01-01 DIAGNOSIS — K769 Liver disease, unspecified: Secondary | ICD-10-CM | POA: Diagnosis not present

## 2023-01-01 DIAGNOSIS — N2581 Secondary hyperparathyroidism of renal origin: Secondary | ICD-10-CM | POA: Diagnosis not present

## 2023-01-01 DIAGNOSIS — N186 End stage renal disease: Secondary | ICD-10-CM | POA: Diagnosis not present

## 2023-01-03 DIAGNOSIS — K769 Liver disease, unspecified: Secondary | ICD-10-CM | POA: Diagnosis not present

## 2023-01-03 DIAGNOSIS — N186 End stage renal disease: Secondary | ICD-10-CM | POA: Diagnosis not present

## 2023-01-03 DIAGNOSIS — Z992 Dependence on renal dialysis: Secondary | ICD-10-CM | POA: Diagnosis not present

## 2023-01-03 DIAGNOSIS — N2581 Secondary hyperparathyroidism of renal origin: Secondary | ICD-10-CM | POA: Diagnosis not present

## 2023-01-03 DIAGNOSIS — D631 Anemia in chronic kidney disease: Secondary | ICD-10-CM | POA: Diagnosis not present

## 2023-08-26 IMAGING — CT CT CHEST W/O CM
1 series · 15 of 34 positions shown, 19 images · non-contrast
Comparison: 05/22/2020

CLINICAL DATA: Renal cell carcinoma.  Restaging.

EXAM:
CT CHEST WITHOUT CONTRAST
TECHNIQUE: Multidetector CT imaging of the chest was performed following the
standard protocol without IV contrast.

[Series 2: chest w/(date) · axial · 0.73mm/px · z∈[-318,-58]mm · 15 of 154 slices shown, 19 images]
[im 12/154  mediastinal]
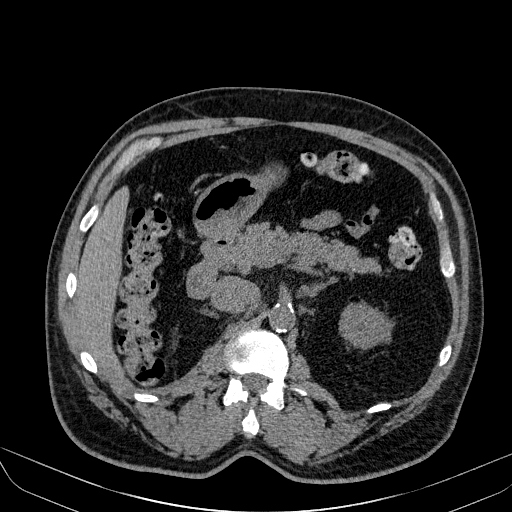
[im 12/154  lung]
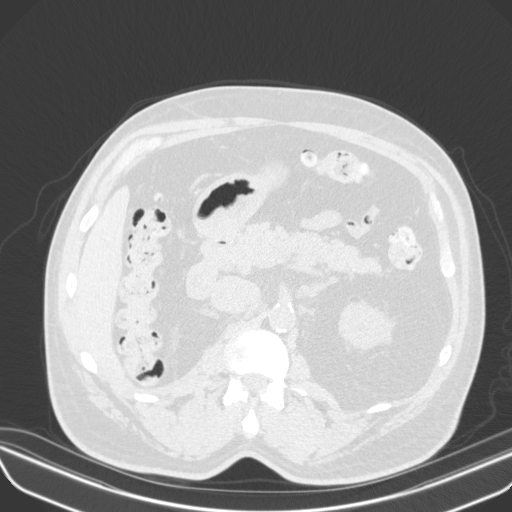
[im 23/154  lung]
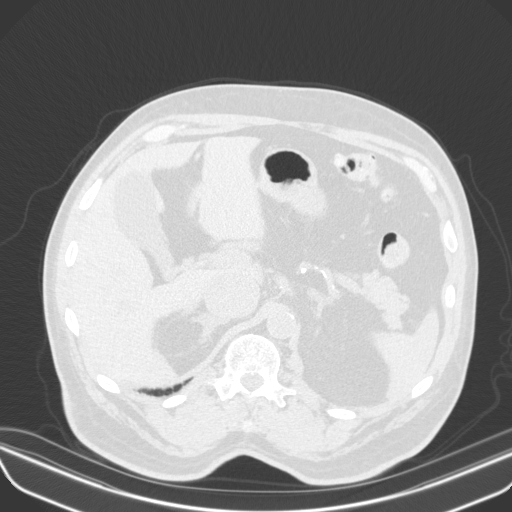
[im 31/154  lung]
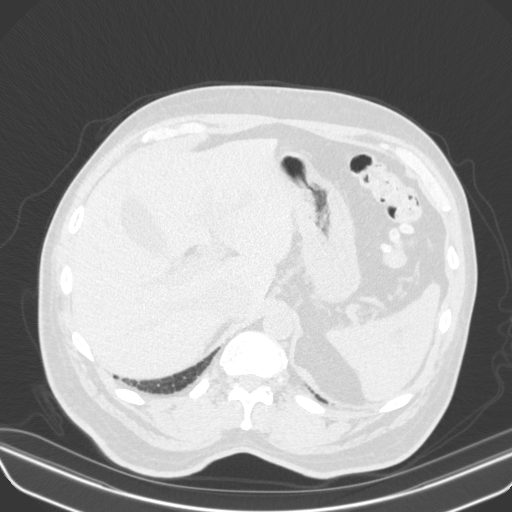
[im 40/154  lung]
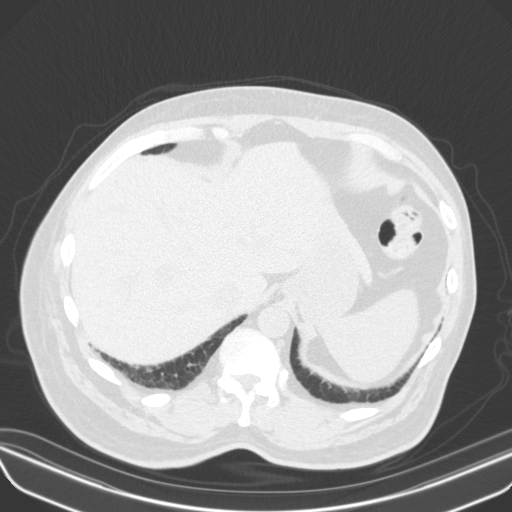
[im 52/154  mediastinal]
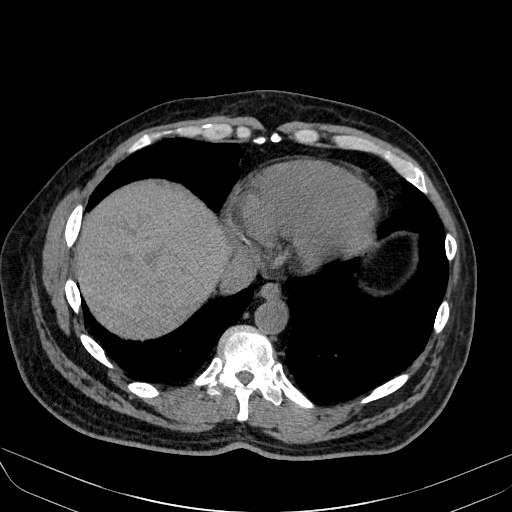
[im 52/154  lung]
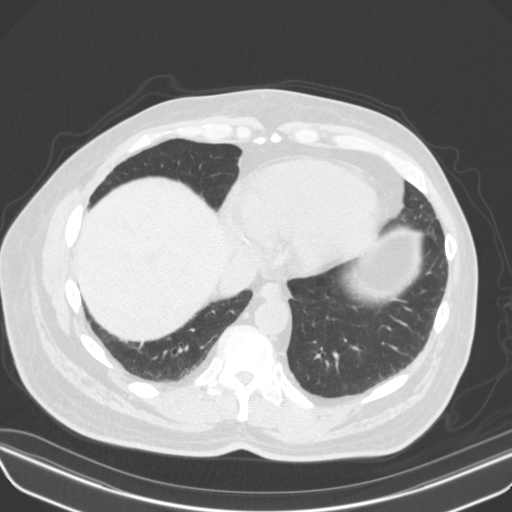
[im 62/154  lung]
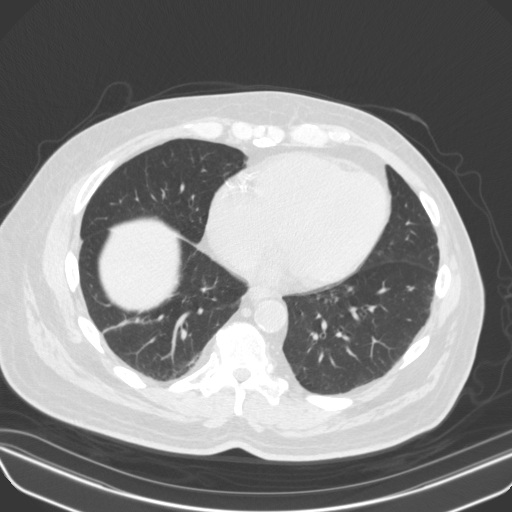
[im 69/154  lung]
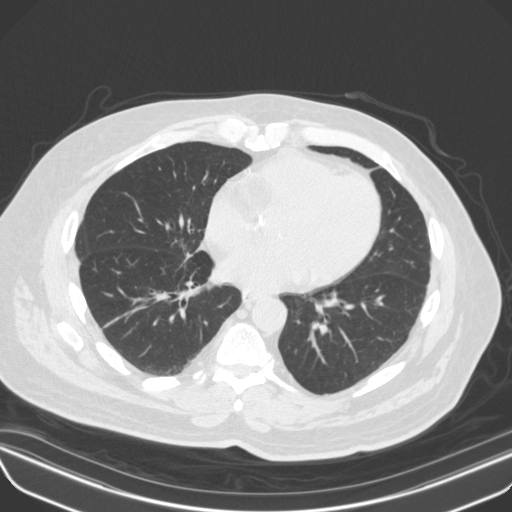
[im 80/154  lung]
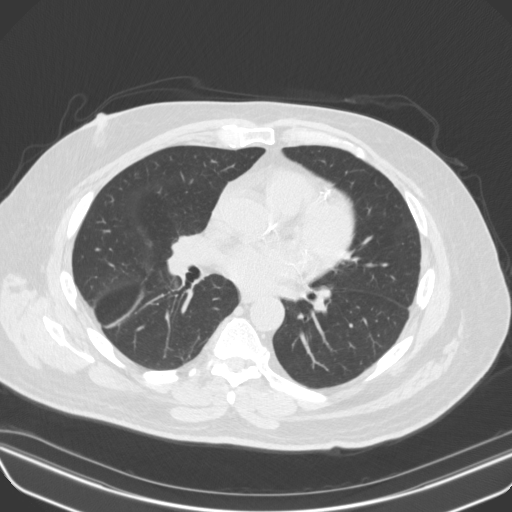
[im 86/154  mediastinal]
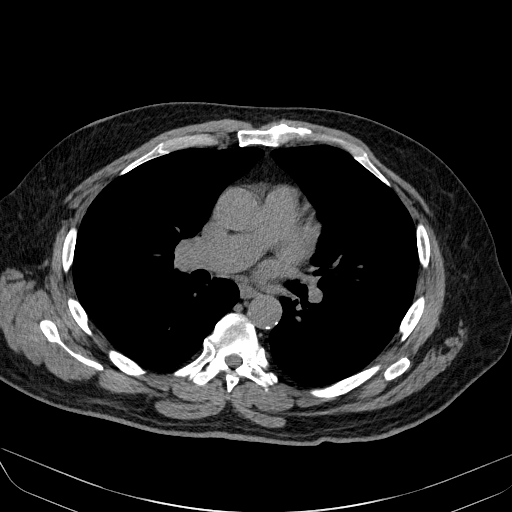
[im 86/154  lung]
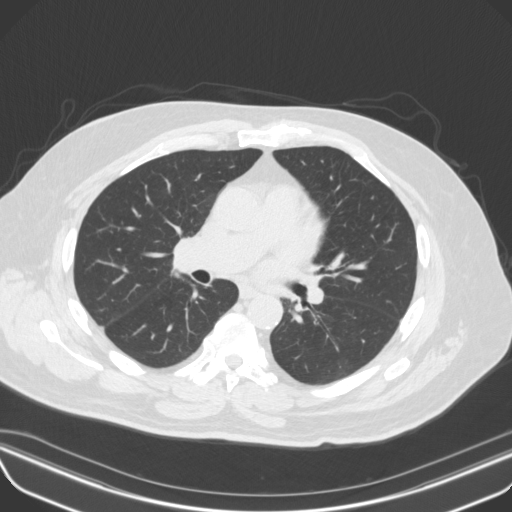
[im 92/154  lung]
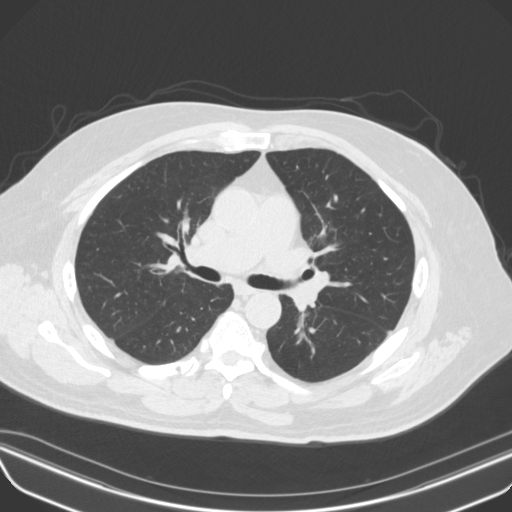
[im 103/154  lung]
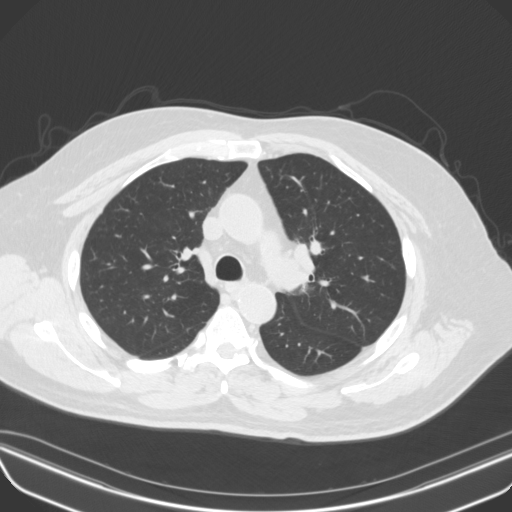
[im 114/154  lung]
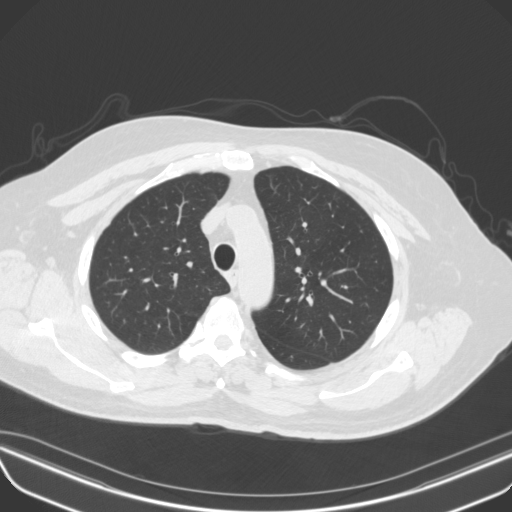
[im 123/154  mediastinal]
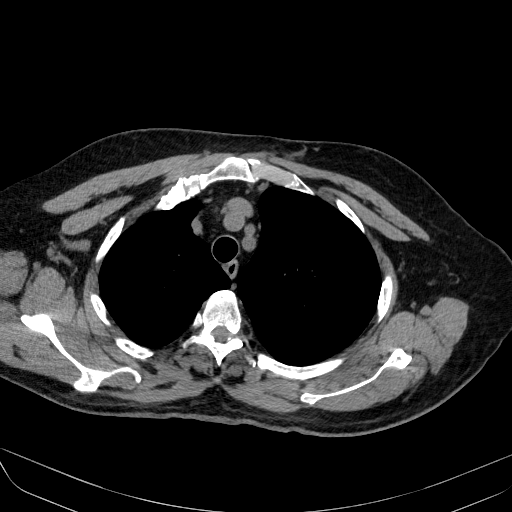
[im 123/154  lung]
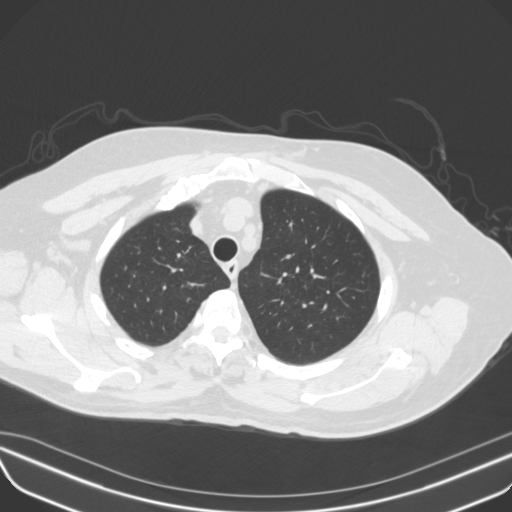
[im 131/154  lung]
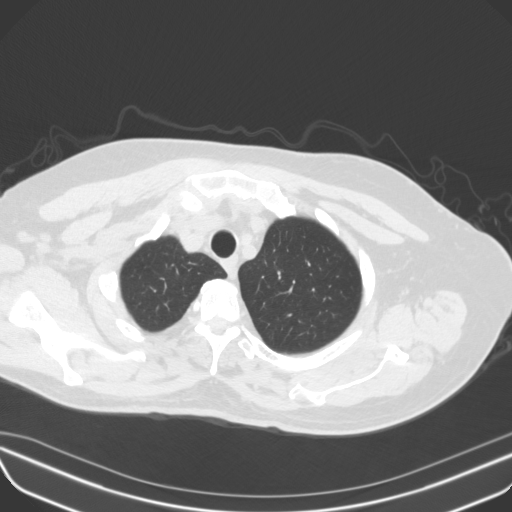
[im 142/154  lung]
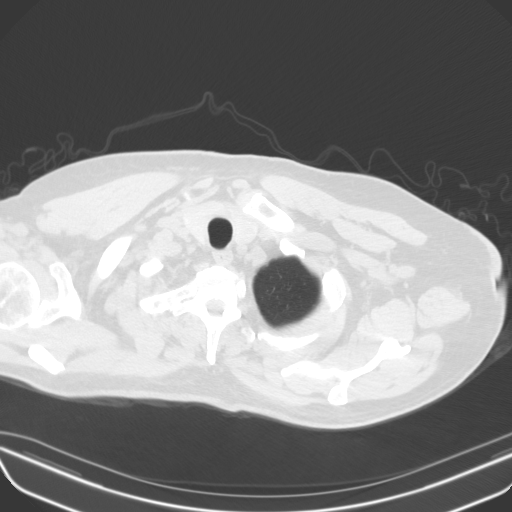

[15 of 34 positions shown; findings below may reference images not displayed]

FINDINGS: Cardiovascular: The heart size is normal. No substantial pericardial
effusion. Coronary artery calcification is evident. Mild
atherosclerotic calcification is noted in the wall of the thoracic
aorta.

Mediastinum/Nodes: No mediastinal lymphadenopathy. No evidence for
gross hilar lymphadenopathy although assessment is limited by the
lack of intravenous contrast on the current study. The esophagus has
normal imaging features. There is no axillary lymphadenopathy.

Lungs/Pleura: Stable tiny 2 mm right upper lobe nodule on 49/5.
Subsegmental atelectasis or linear scarring noted right lower lobe
no new suspicious pulmonary nodule or mass. No focal airspace
consolidation. No pleural effusion.

Upper Abdomen: Stable right adrenal nodule measuring up to 2 cm.
This was characterized as adenoma on previous MRI.

Musculoskeletal: No worrisome lytic or sclerotic osseous
abnormality.
IMPRESSION: 1. Stable exam. No new or progressive findings in the chest.
2. Stable right adrenal adenoma.
3. Aortic Atherosclerosis ([id number]).
